# Patient Record
Sex: Female | Born: 1985 | Race: Black or African American | Hispanic: No | Marital: Married | Smoking: Never smoker
Health system: Southern US, Community
[De-identification: ages and names within clinical notes are randomized; demographics above are authoritative.]

## PROBLEM LIST (undated history)

## (undated) DIAGNOSIS — F329 Major depressive disorder, single episode, unspecified: Secondary | ICD-10-CM

## (undated) DIAGNOSIS — D219 Benign neoplasm of connective and other soft tissue, unspecified: Secondary | ICD-10-CM

## (undated) DIAGNOSIS — H8109 Meniere's disease, unspecified ear: Secondary | ICD-10-CM

## (undated) DIAGNOSIS — D649 Anemia, unspecified: Secondary | ICD-10-CM

## (undated) DIAGNOSIS — E0590 Thyrotoxicosis, unspecified without thyrotoxic crisis or storm: Secondary | ICD-10-CM

## (undated) DIAGNOSIS — E039 Hypothyroidism, unspecified: Secondary | ICD-10-CM

## (undated) DIAGNOSIS — F32A Depression, unspecified: Secondary | ICD-10-CM

## (undated) DIAGNOSIS — I4719 Other supraventricular tachycardia: Secondary | ICD-10-CM

## (undated) DIAGNOSIS — I10 Essential (primary) hypertension: Secondary | ICD-10-CM

## (undated) DIAGNOSIS — I471 Supraventricular tachycardia: Secondary | ICD-10-CM

## (undated) DIAGNOSIS — F419 Anxiety disorder, unspecified: Secondary | ICD-10-CM

## (undated) DIAGNOSIS — R42 Dizziness and giddiness: Secondary | ICD-10-CM

## (undated) HISTORY — DX: Thyrotoxicosis, unspecified without thyrotoxic crisis or storm: E05.90

## (undated) HISTORY — DX: Essential (primary) hypertension: I10

## (undated) HISTORY — DX: Benign neoplasm of connective and other soft tissue, unspecified: D21.9

## (undated) HISTORY — DX: Dizziness and giddiness: R42

## (undated) HISTORY — DX: Other supraventricular tachycardia: I47.19

## (undated) HISTORY — DX: Supraventricular tachycardia: I47.1

---

## 2010-04-12 ENCOUNTER — Emergency Department (HOSPITAL_COMMUNITY): Admission: EM | Admit: 2010-04-12 | Discharge: 2010-04-12 | Payer: Self-pay | Admitting: Emergency Medicine

## 2010-11-13 ENCOUNTER — Inpatient Hospital Stay (HOSPITAL_COMMUNITY)
Admission: EM | Admit: 2010-11-13 | Discharge: 2010-11-16 | DRG: 310 | Disposition: A | Discharge status: Home / Self Care | Payer: Self-pay | Attending: Internal Medicine | Admitting: Internal Medicine

## 2010-11-13 ENCOUNTER — Emergency Department (HOSPITAL_COMMUNITY): Payer: Self-pay

## 2010-11-13 DIAGNOSIS — E0590 Thyrotoxicosis, unspecified without thyrotoxic crisis or storm: Secondary | ICD-10-CM | POA: Diagnosis present

## 2010-11-13 DIAGNOSIS — R002 Palpitations: Secondary | ICD-10-CM | POA: Diagnosis present

## 2010-11-13 DIAGNOSIS — R000 Tachycardia, unspecified: Secondary | ICD-10-CM

## 2010-11-13 DIAGNOSIS — F411 Generalized anxiety disorder: Secondary | ICD-10-CM | POA: Diagnosis present

## 2010-11-13 DIAGNOSIS — I498 Other specified cardiac arrhythmias: Principal | ICD-10-CM | POA: Diagnosis present

## 2010-11-13 DIAGNOSIS — R0789 Other chest pain: Secondary | ICD-10-CM | POA: Diagnosis present

## 2010-11-13 LAB — DIFFERENTIAL
Basophils Absolute: 0 10*3/uL (ref 0.0–0.1)
Eosinophils Absolute: 0.1 10*3/uL (ref 0.0–0.7)
Eosinophils Relative: 1 % (ref 0–5)
Lymphocytes Relative: 38 % (ref 12–46)
Neutrophils Relative %: 49 % (ref 43–77)

## 2010-11-13 LAB — COMPREHENSIVE METABOLIC PANEL
ALT: 28 U/L (ref 0–35)
AST: 27 U/L (ref 0–37)
Albumin: 4.1 g/dL (ref 3.5–5.2)
Alkaline Phosphatase: 106 U/L (ref 39–117)
BUN: 10 mg/dL (ref 6–23)
CO2: 25 mEq/L (ref 19–32)
Creatinine, Ser: 0.38 mg/dL — ABNORMAL LOW (ref 0.4–1.2)
GFR calc Af Amer: >60 mL/min (ref >60)
GFR calc non Af Amer: >60 mL/min (ref >60)
Glucose, Bld: 101 mg/dL — ABNORMAL HIGH (ref 70–99)
Sodium: 136 mEq/L (ref 135–145)
Total Protein: 7.9 g/dL (ref 6.0–8.3)

## 2010-11-13 LAB — URINALYSIS, ROUTINE W REFLEX MICROSCOPIC
Hgb urine dipstick: NEGATIVE
Protein, ur: NEGATIVE mg/dL
Specific Gravity, Urine: 1.016 (ref 1.005–1.030)
pH: 5.5 (ref 5.0–8.0)

## 2010-11-13 LAB — T4, FREE: Free T4: 5.57 ng/dL — ABNORMAL HIGH (ref 0.80–1.80)

## 2010-11-13 LAB — CBC
Hemoglobin: 13.3 g/dL (ref 12.0–15.0)
MCH: 24.7 pg — ABNORMAL LOW (ref 26.0–34.0)
MCHC: 33.3 g/dL (ref 30.0–36.0)
RDW: 13.2 % (ref 11.5–15.5)

## 2010-11-13 LAB — TSH: TSH: 0.008 u[IU]/mL — ABNORMAL LOW (ref 0.350–4.500)

## 2010-11-13 LAB — MRSA PCR SCREENING: MRSA by PCR: POSITIVE — AB

## 2010-11-13 LAB — PROTIME-INR
INR: 0.99 (ref 0.00–1.49)
Prothrombin Time: 13.3 seconds (ref 11.6–15.2)

## 2010-11-13 LAB — T3 UPTAKE: T3 Uptake Ratio: 51.8 % — ABNORMAL HIGH (ref 22.5–37.0)

## 2010-11-13 LAB — PREGNANCY, URINE: Preg Test, Ur: NEGATIVE

## 2010-11-14 ENCOUNTER — Inpatient Hospital Stay (HOSPITAL_COMMUNITY): Payer: Self-pay

## 2010-11-14 DIAGNOSIS — I471 Supraventricular tachycardia: Secondary | ICD-10-CM

## 2010-11-16 DIAGNOSIS — I471 Supraventricular tachycardia, unspecified: Secondary | ICD-10-CM

## 2010-11-18 ENCOUNTER — Ambulatory Visit (INDEPENDENT_AMBULATORY_CARE_PROVIDER_SITE_OTHER): Payer: Self-pay | Admitting: Cardiology

## 2010-11-18 DIAGNOSIS — R000 Tachycardia, unspecified: Secondary | ICD-10-CM

## 2010-12-06 ENCOUNTER — Telehealth: Payer: Self-pay | Admitting: Cardiology

## 2010-12-06 NOTE — Telephone Encounter (Signed)
Called wanting to know if could take pain med w/ current medication. LM for pt to return call

## 2010-12-06 NOTE — Telephone Encounter (Signed)
PT WANTS TO KNOW IF SHE CAN TAKE PAIN MED WITH HER CURRENT MEDS? SEARCHING FOR CHART.

## 2011-01-03 ENCOUNTER — Encounter: Payer: Self-pay | Admitting: Cardiology

## 2011-01-03 DIAGNOSIS — E0500 Thyrotoxicosis with diffuse goiter without thyrotoxic crisis or storm: Secondary | ICD-10-CM | POA: Insufficient documentation

## 2011-01-03 DIAGNOSIS — R42 Dizziness and giddiness: Secondary | ICD-10-CM | POA: Insufficient documentation

## 2011-01-03 DIAGNOSIS — I471 Supraventricular tachycardia: Secondary | ICD-10-CM | POA: Insufficient documentation

## 2011-01-09 ENCOUNTER — Ambulatory Visit: Payer: Self-pay | Admitting: Cardiology

## 2011-01-17 ENCOUNTER — Ambulatory Visit: Payer: Self-pay | Admitting: Cardiology

## 2011-01-29 ENCOUNTER — Ambulatory Visit: Payer: Self-pay | Admitting: Cardiology

## 2011-02-18 ENCOUNTER — Encounter: Payer: Self-pay | Admitting: Cardiology

## 2011-02-18 ENCOUNTER — Ambulatory Visit (INDEPENDENT_AMBULATORY_CARE_PROVIDER_SITE_OTHER): Payer: Self-pay | Admitting: Cardiology

## 2011-02-18 VITALS — BP 134/80 | HR 80 | Ht 63.0 in | Wt 162.2 lb

## 2011-02-18 DIAGNOSIS — E0590 Thyrotoxicosis, unspecified without thyrotoxic crisis or storm: Secondary | ICD-10-CM

## 2011-02-18 DIAGNOSIS — R002 Palpitations: Secondary | ICD-10-CM

## 2011-02-18 LAB — CBC WITH DIFFERENTIAL/PLATELET
HCT: 37.4 % (ref 36.0–46.0)
Hemoglobin: 12.6 g/dL (ref 12.0–15.0)
Lymphs Abs: 2.8 10*3/uL (ref 0.7–4.0)
MCV: 77.4 fl — ABNORMAL LOW (ref 78.0–100.0)
Monocytes Relative: 12.6 % — ABNORMAL HIGH (ref 3.0–12.0)
Neutro Abs: 2.8 10*3/uL (ref 1.4–7.7)
Platelets: 341 10*3/uL (ref 150.0–400.0)
RDW: 14.5 % (ref 11.5–14.6)
WBC: 6.5 10*3/uL (ref 4.5–10.5)

## 2011-02-18 LAB — BASIC METABOLIC PANEL
BUN: 12 mg/dL (ref 6–23)
Chloride: 108 mEq/L (ref 96–112)
GFR: 285.87 mL/min (ref >60.00)
Sodium: 138 mEq/L (ref 135–145)

## 2011-02-18 LAB — HEPATIC FUNCTION PANEL
ALT: 38 U/L — ABNORMAL HIGH (ref 0–35)
Total Protein: 7.7 g/dL (ref 6.0–8.3)

## 2011-02-18 LAB — TSH: TSH: 0.02 u[IU]/mL — ABNORMAL LOW (ref 0.35–5.50)

## 2011-02-18 NOTE — Progress Notes (Signed)
Subjective:   Alyssa Hall is seen today for followup visit. She did see Dr.Balan for a first visit were entered in followup for her hyperthyroidism and ectopic atrial tachycardia. With PTU and atenolol, she basically has normalized from a standpoint of symptoms.  She is planning on getting back to endocrinology for the possibility of radioactive iodine. I again cautioned her about pregnancy with PTU. She has not had followup lab work and I will check a CBC and C. Met today as well as a TSH and refer her that to Dr. Romero Belling.  She has gradually gained weight and has gained approximate 7 pounds since March.  Current Outpatient Prescriptions  Medication Sig Dispense Refill  . atenolol (TENORMIN) 100 MG tablet Take 100 mg by mouth 2 (two) times daily.        Marland Kitchen propylthiouracil (PTU) 50 MG tablet Take 150 mg by mouth 3 (three) times daily.         No Known Allergies  Patient Active Problem List  Diagnoses  . Thyrotoxicosis  . Atrial tachycardia  . Dizziness    History  Smoking status  . Never Smoker   Smokeless tobacco  . Not on file    History  Alcohol Use No    No family history on file.  Review of Systems:   The patient denies any heat or cold intolerance.    The patient denies headaches or blurry vision.  There is no cough or sputum production.  The patient denies dizziness.  There is no hematuria or hematochezia.  The patient denies any muscle aches or arthritis.  The patient denies any rash.  The patient denies frequent falling or instability.  There is no history of depression or anxiety.  All other systems were reviewed and are negative.   Physical Exam:   Weight is 162. Blood pressure is 134/80. Heart rate is 80.The head is normocephalic and atraumatic.  Pupils are equally round and reactive to light.  Sclerae nonicteric.  Conjunctiva is clear.  Oropharynx is unremarkable.  There's adequate oral airway.  Neck is supple there are no masses.  Thyroid is not enlarged.  There is no  lymphadenopathy.  Lungs are clear.  Chest is symmetric.  Heart shows a regular rate and rhythm.  S1 and S2 are normal.  There is no murmur click or gallop.  Abdomen is soft normal bowel sounds.  There is no organomegaly.  Genital and rectal deferred.  Extremities are without edema.  Peripheral pulses are adequate.  Neurologically intact.  Full range of motion.  The patient is not depressed.  Skin is warm and dry.  Assessment / Plan:

## 2011-02-18 NOTE — Assessment & Plan Note (Signed)
She clinically appears to be euthyroid at this point in time. I encouraged her to have necessary endocrinologic followup. I will check CBC, C. Met, and TSH and forward that to Dr. Romero Belling. She has not had recurrent tachycardia and overall feels good her cardiology standpoint. I informed her that she will need to be on thyroid replacement after radioactive Iodine.

## 2011-02-24 ENCOUNTER — Telehealth: Payer: Self-pay | Admitting: *Deleted

## 2011-02-24 NOTE — Telephone Encounter (Signed)
Message copied by Adolphus Birchwood on Mon Feb 24, 2011 10:27 AM ------      Message from: Roger Shelter      Created: Fri Feb 21, 2011  1:31 PM       [facility], send to Dr. Talmage Nap.

## 2011-02-25 NOTE — Telephone Encounter (Signed)
Left message to call back.  RN will mail copies of labs to pt.  RN faxed copies of labs and office visit to Dr. Assunta Found.

## 2011-03-27 ENCOUNTER — Emergency Department (HOSPITAL_COMMUNITY)
Admission: EM | Admit: 2011-03-27 | Discharge: 2011-03-28 | Disposition: A | Discharge status: Home / Self Care | Payer: Self-pay | Attending: Emergency Medicine | Admitting: Emergency Medicine

## 2011-03-27 DIAGNOSIS — M549 Dorsalgia, unspecified: Secondary | ICD-10-CM | POA: Insufficient documentation

## 2011-03-27 DIAGNOSIS — E0590 Thyrotoxicosis, unspecified without thyrotoxic crisis or storm: Secondary | ICD-10-CM | POA: Insufficient documentation

## 2011-03-27 DIAGNOSIS — R109 Unspecified abdominal pain: Secondary | ICD-10-CM | POA: Insufficient documentation

## 2011-03-27 DIAGNOSIS — R079 Chest pain, unspecified: Secondary | ICD-10-CM | POA: Insufficient documentation

## 2011-03-27 DIAGNOSIS — Z79899 Other long term (current) drug therapy: Secondary | ICD-10-CM | POA: Insufficient documentation

## 2011-03-27 DIAGNOSIS — R0602 Shortness of breath: Secondary | ICD-10-CM | POA: Insufficient documentation

## 2011-03-28 ENCOUNTER — Emergency Department (HOSPITAL_COMMUNITY): Payer: Self-pay

## 2011-03-28 LAB — URINALYSIS, ROUTINE W REFLEX MICROSCOPIC
Bilirubin Urine: NEGATIVE
Nitrite: NEGATIVE
Protein, ur: NEGATIVE mg/dL
Urobilinogen, UA: 0.2 mg/dL (ref 0.0–1.0)

## 2011-03-28 LAB — POCT I-STAT, CHEM 8
Creatinine, Ser: 0.3 mg/dL — ABNORMAL LOW (ref 0.50–1.10)
Hemoglobin: 12.9 g/dL (ref 12.0–15.0)
Potassium: 3.8 mEq/L (ref 3.5–5.1)
Sodium: 140 mEq/L (ref 135–145)
TCO2: 22 mmol/L (ref 0–100)

## 2011-03-28 LAB — CK TOTAL AND CKMB (NOT AT ARMC)
CK, MB: 1.9 ng/mL (ref 0.3–4.0)
Relative Index: INVALID (ref 0.0–2.5)
Total CK: 84 U/L (ref 7–177)

## 2011-03-28 LAB — POCT PREGNANCY, URINE: Preg Test, Ur: NEGATIVE

## 2011-03-31 ENCOUNTER — Emergency Department (HOSPITAL_COMMUNITY)
Admission: EM | Admit: 2011-03-31 | Discharge: 2011-04-01 | Disposition: A | Discharge status: Home / Self Care | Payer: Self-pay | Attending: Emergency Medicine | Admitting: Emergency Medicine

## 2011-03-31 DIAGNOSIS — I10 Essential (primary) hypertension: Secondary | ICD-10-CM | POA: Insufficient documentation

## 2011-03-31 DIAGNOSIS — F411 Generalized anxiety disorder: Secondary | ICD-10-CM | POA: Insufficient documentation

## 2011-05-31 ENCOUNTER — Emergency Department (HOSPITAL_COMMUNITY)
Admission: EM | Admit: 2011-05-31 | Discharge: 2011-05-31 | Disposition: A | Discharge status: Home / Self Care | Payer: Self-pay | Attending: Emergency Medicine | Admitting: Emergency Medicine

## 2011-05-31 DIAGNOSIS — E0590 Thyrotoxicosis, unspecified without thyrotoxic crisis or storm: Secondary | ICD-10-CM | POA: Insufficient documentation

## 2011-05-31 DIAGNOSIS — Z9119 Patient's noncompliance with other medical treatment and regimen: Secondary | ICD-10-CM | POA: Insufficient documentation

## 2011-05-31 DIAGNOSIS — Z91199 Patient's noncompliance with other medical treatment and regimen due to unspecified reason: Secondary | ICD-10-CM | POA: Insufficient documentation

## 2011-05-31 DIAGNOSIS — R000 Tachycardia, unspecified: Secondary | ICD-10-CM | POA: Insufficient documentation

## 2011-05-31 DIAGNOSIS — R42 Dizziness and giddiness: Secondary | ICD-10-CM | POA: Insufficient documentation

## 2011-05-31 DIAGNOSIS — F411 Generalized anxiety disorder: Secondary | ICD-10-CM | POA: Insufficient documentation

## 2011-05-31 DIAGNOSIS — I10 Essential (primary) hypertension: Secondary | ICD-10-CM | POA: Insufficient documentation

## 2011-06-05 ENCOUNTER — Encounter: Payer: Self-pay | Admitting: Internal Medicine

## 2011-06-05 ENCOUNTER — Ambulatory Visit (INDEPENDENT_AMBULATORY_CARE_PROVIDER_SITE_OTHER): Payer: Self-pay | Admitting: Internal Medicine

## 2011-06-05 VITALS — BP 125/76 | HR 92 | Temp 97.5°F | Ht 63.0 in | Wt 162.3 lb

## 2011-06-05 DIAGNOSIS — I498 Other specified cardiac arrhythmias: Secondary | ICD-10-CM

## 2011-06-05 DIAGNOSIS — I471 Supraventricular tachycardia: Secondary | ICD-10-CM

## 2011-06-05 DIAGNOSIS — I4719 Other supraventricular tachycardia: Secondary | ICD-10-CM

## 2011-06-05 DIAGNOSIS — E0590 Thyrotoxicosis, unspecified without thyrotoxic crisis or storm: Secondary | ICD-10-CM

## 2011-06-05 NOTE — Patient Instructions (Addendum)
Please make an appointment 3-4 weeks after getting radioactive iodine ablation by endocrinologist. Before that if anything comes up please call the clinic and make an early appointment if needed. Please call the clinic about a week before for refills. Get the paperwork for orange card done as soon as possible so we can do the referral to endocrinology. Meanwhile keep on taking propylthiouracil 150 mg 3 times a day and atenolol 100 mg twice a day.

## 2011-06-05 NOTE — Assessment & Plan Note (Signed)
Mr. [name] was diagnosed with thyrotoxicosis in March 2012 when she was hospitalized with atrial tachycardia. Her TSH was 0.02 in June 2012 and her free T4-5.57 in March 2012. She was started on PTU 100 mg 3 times a day and atenolol 100 mg twice a day for thyrotoxicosis medical management- which she apparently tolerated well. She was supposed to be followed up by the endocrinologist meanwhile to get radioactive iodine ablation- which did not happen due to insurance issues. She was seen in the ER recently for tachycardia and palpitation again as she was out of her medications for about 3 months and did not take it because of financial issues. She got prescription of propylthiouracil and atenolol from ER which she refilled from [facility]. Her vitals are stable today and she feels okay. She is going to apply for orange card in our clinic today and once she gets that we will refer her to endocrinology for further management. Until then she would be medically managed with PTU 150 mg 3 times a day and atenolol 100 mg twice a day. I discussed with patient and her fianc about expectation of appointment with endocrinology in November and answered all their questions and concerns about thyrotoxicosis management. They verbalized understanding it and and were satisfied.

## 2011-06-05 NOTE — Assessment & Plan Note (Signed)
Heart normal today. patient taking atenolol

## 2011-06-05 NOTE — Progress Notes (Signed)
  Subjective:    [name], female    DOB: February 22, 1986, 25 y.o.   MRN: [id number]  HPI Alyssa Hall is a pleasant 57 year woman with past with history of thyrotoxicosis diagnosed in March 2012, atrial tachycardia and anxiety, comes to the clinic for establishment visit. Patient went to ER on 05/31/2011 as she was out for PTU and atenolol for medical management of thyrotoxicosis and was having tachycardia. She was admitted in hospital in March 2012 for atrial tachycardia and was found to have thyrotoxicosis For which she was supposed to have outpatient appointment with endocrinology for radioactive iodine ablation, but she did not get it done because of insurance issues. She was doing well with medications until recently when she went to ER. She was prescribed PTU and atenolol from ER and she filled it and her vitals are stable today due to that and she is feeling better. She is accompanied by her fianc. She denies any fever, chills, nausea, vomiting, palpitations, chest pain, short of breath, abdominal pain, diarrhea.   Review of Systems    as per history of present illness, all other systems reviewed and negative. Objective:   Physical Exam Constitutional: Vital signs reviewed.  Patient is a well-developed and well-nourished  in no acute distress and cooperative with exam. Alert and oriented x3.  Head: Normocephalic and atraumatic Mouth: no erythema or exudates, MMM Eyes: PERRL, EOMI, conjunctivae normal, No scleral icterus.  Neck: Supple, Trachea midline normal ROM, No JVD Cardiovascular: RRR, S1 normal, S2 normal, no MRG, pulses symmetric and intact bilaterally Pulmonary/Chest: CTAB, no wheezes, rales, or rhonchi Abdominal: Soft. Non-tender, non-distended, bowel sounds are normal Musculoskeletal: No joint deformities, erythema, or stiffness, ROM full and no nontender Neurological: A&O x3, Strenght is normal and symmetric bilaterally, cranial nerve II-XII are grossly intact,  no focal motor deficit, sensory intact to light touch bilaterally.  Skin: Warm, dry and intact. No rash, cyanosis, or clubbing.          Assessment & Plan:

## 2011-06-15 ENCOUNTER — Emergency Department (HOSPITAL_COMMUNITY)
Admission: EM | Admit: 2011-06-15 | Discharge: 2011-06-15 | Disposition: A | Discharge status: Home / Self Care | Payer: Self-pay | Attending: Emergency Medicine | Admitting: Emergency Medicine

## 2011-06-15 DIAGNOSIS — R000 Tachycardia, unspecified: Secondary | ICD-10-CM | POA: Insufficient documentation

## 2011-06-15 DIAGNOSIS — I10 Essential (primary) hypertension: Secondary | ICD-10-CM | POA: Insufficient documentation

## 2011-06-15 DIAGNOSIS — R0602 Shortness of breath: Secondary | ICD-10-CM | POA: Insufficient documentation

## 2011-06-15 DIAGNOSIS — Z79899 Other long term (current) drug therapy: Secondary | ICD-10-CM | POA: Insufficient documentation

## 2011-06-15 DIAGNOSIS — F411 Generalized anxiety disorder: Secondary | ICD-10-CM | POA: Insufficient documentation

## 2011-06-15 DIAGNOSIS — R002 Palpitations: Secondary | ICD-10-CM | POA: Insufficient documentation

## 2011-06-15 DIAGNOSIS — E0590 Thyrotoxicosis, unspecified without thyrotoxic crisis or storm: Secondary | ICD-10-CM | POA: Insufficient documentation

## 2011-06-15 DIAGNOSIS — F410 Panic disorder [episodic paroxysmal anxiety] without agoraphobia: Secondary | ICD-10-CM | POA: Insufficient documentation

## 2011-06-15 LAB — POCT I-STAT, CHEM 8
BUN: 11 mg/dL (ref 6–23)
Calcium, Ion: 1.21 mmol/L (ref 1.12–1.32)
Chloride: 107 mEq/L (ref 96–112)
Creatinine, Ser: 0.4 mg/dL — ABNORMAL LOW (ref 0.50–1.10)
Glucose, Bld: 100 mg/dL — ABNORMAL HIGH (ref 70–99)
HCT: 37 % (ref 36.0–46.0)
Hemoglobin: 12.6 g/dL (ref 12.0–15.0)
Potassium: 3.8 mEq/L (ref 3.5–5.1)
Sodium: 141 mEq/L (ref 135–145)
TCO2: 22 mmol/L (ref 0–100)

## 2011-06-15 LAB — POCT I-STAT TROPONIN I
Troponin i, poc: 0 ng/mL (ref 0.00–0.08)
Troponin i, poc: 0 ng/mL (ref 0.00–0.08)

## 2011-06-18 ENCOUNTER — Encounter: Payer: Self-pay | Admitting: Internal Medicine

## 2011-06-25 ENCOUNTER — Encounter: Payer: Self-pay | Admitting: Internal Medicine

## 2011-06-25 ENCOUNTER — Ambulatory Visit (INDEPENDENT_AMBULATORY_CARE_PROVIDER_SITE_OTHER): Payer: Self-pay | Admitting: Internal Medicine

## 2011-06-25 VITALS — BP 132/84 | HR 86 | Temp 98.1°F | Ht 63.0 in | Wt 160.9 lb

## 2011-06-25 DIAGNOSIS — F410 Panic disorder [episodic paroxysmal anxiety] without agoraphobia: Secondary | ICD-10-CM

## 2011-06-25 DIAGNOSIS — E0590 Thyrotoxicosis, unspecified without thyrotoxic crisis or storm: Secondary | ICD-10-CM

## 2011-06-25 MED ORDER — ALPRAZOLAM 1 MG PO TABS: 1.0000 mg | ORAL_TABLET | Freq: Every evening | ORAL | Status: AC | PRN

## 2011-06-25 MED ORDER — METHIMAZOLE 10 MG PO TABS: 10.0000 mg | ORAL_TABLET | Freq: Three times a day (TID) | ORAL | Status: DC

## 2011-06-25 MED ORDER — ATENOLOL 100 MG PO TABS: 100.0000 mg | ORAL_TABLET | Freq: Two times a day (BID) | ORAL | Status: DC

## 2011-06-25 NOTE — Patient Instructions (Signed)
Please make appointment after 3-4 weeks of getting radiation therapy. Please start taking methimazole 10 mg daily instead of PTU. If you have any symptoms of anxiety or other symptoms of hyperthyroidism, give [place] a call so we can adjust the dose of the new medication. Also take Xanax 1 mg as needed for anxiety attacks. You can take over-the-counter Benadryl for cough and cold.

## 2011-06-26 ENCOUNTER — Telehealth: Payer: Self-pay | Admitting: *Deleted

## 2011-06-26 DIAGNOSIS — F410 Panic disorder [episodic paroxysmal anxiety] without agoraphobia: Secondary | ICD-10-CM | POA: Insufficient documentation

## 2011-06-26 NOTE — Progress Notes (Signed)
  Subjective:    [name], female    DOB: [birth year], 25 y.o.   MRN: [id number]  HPI Alyssa Hall is a pleasant 25 year old woman with thyrotoxicosis, intermittent panic attacks who comes in the clinic for change in medication. She is on PTU for thyrotoxicosis which apparently is not available at [facility]. But they do carry methimazole and was told to see the doctor for possible change in medication. I saw her on 06/05/2011 and refilled PTU and atenolol.  After that she got orange card- but [facility] pharmacy does not carry PTU at present. Meanwhile she had one panic attack and was evaluated in the ED and given diazepam which helped her. She does complain of intermittent anxiety/panic attacks although being on PTU. She is waiting for endocrinology appointment for radio active iodine ablation- which apparently has about 2 months of waiting. She denies any fever, chills, nausea, vomiting, abdominal pain, chest pain, short of breath, diarrhea.   Review of Systems    as per history of present illness, all other systems reviewed and negative. Objective:   Physical Exam  Vitals: Reviewed General: NAD HEENT: PERRL, EOMI, no scleral icterus Cardiac: RRR, no rubs, murmurs or gallops Pulm: clear to auscultation bilaterally, moving normal volumes of air Abd: soft, nontender, nondistended, BS present Ext: warm and well perfused, no pedal edema Neuro: alert and oriented X3, cranial nerves II-XII grossly intact, strength and sensation to light touch equal in bilateral upper and lower extremities       Assessment & Plan:

## 2011-06-26 NOTE — Assessment & Plan Note (Signed)
To prevent recurrent ED visits, I will prescribe her Ativan 1 mg by mouth when necessary for panic attacks of severe anxiety symptoms. She benefited from diazepam in ER on 06/15/2011. And we prescribed 30 tablets without any refills.

## 2011-06-26 NOTE — Telephone Encounter (Signed)
Please advise of directions for methamazole, is it 10mg  daily or 10mg  3 times daily, [facility] calls and ask. Please advise, review med list and change if needed  Thank you,h.

## 2011-06-26 NOTE — Assessment & Plan Note (Signed)
Patient with severe hyperthyroidism-diagnosed in March 2012-being on thyroid suppressive medication with intermittent panic/anxiety attacks. Unable to get PTU as described in history of present illness. I will change her to methimazole 10 mg daily. Described her to call the clinic if she is symptomatic in terms of palpitations, diarrhea, heat cold intolerance or any other symptoms. She also has intermittent panic attacks and so I will prescribe Ativan 1 mg when necessary-  30 tablets without any refills.

## 2011-06-27 ENCOUNTER — Other Ambulatory Visit: Payer: Self-pay | Admitting: Internal Medicine

## 2011-06-27 MED ORDER — METHIMAZOLE 10 MG PO TABS: 10.0000 mg | ORAL_TABLET | Freq: Every day | ORAL | Status: DC

## 2011-06-27 NOTE — Telephone Encounter (Signed)
Called to pharm 

## 2011-06-27 NOTE — Telephone Encounter (Signed)
Its 10 mg daily. Thanks for pointing out. Has made appropriate change.  Merric Yost.

## 2011-07-14 ENCOUNTER — Other Ambulatory Visit (HOSPITAL_COMMUNITY): Payer: Self-pay | Admitting: Endocrinology

## 2011-07-14 DIAGNOSIS — E0590 Thyrotoxicosis, unspecified without thyrotoxic crisis or storm: Secondary | ICD-10-CM

## 2011-07-23 ENCOUNTER — Encounter (HOSPITAL_COMMUNITY)
Admission: RE | Admit: 2011-07-23 | Discharge: 2011-07-23 | Disposition: A | Discharge status: Home / Self Care | Payer: Self-pay | Source: Ambulatory Visit | Attending: Endocrinology | Admitting: Endocrinology

## 2011-07-23 DIAGNOSIS — E0590 Thyrotoxicosis, unspecified without thyrotoxic crisis or storm: Secondary | ICD-10-CM

## 2011-07-24 ENCOUNTER — Ambulatory Visit (HOSPITAL_COMMUNITY)
Admission: RE | Admit: 2011-07-24 | Discharge: 2011-07-24 | Disposition: A | Discharge status: Home / Self Care | Payer: Self-pay | Source: Ambulatory Visit | Attending: Endocrinology | Admitting: Endocrinology

## 2011-07-24 DIAGNOSIS — E0500 Thyrotoxicosis with diffuse goiter without thyrotoxic crisis or storm: Secondary | ICD-10-CM | POA: Insufficient documentation

## 2011-07-24 MED ORDER — SODIUM PERTECHNETATE TC 99M INJECTION
10.0000 | Freq: Once | INTRAVENOUS | Status: AC | PRN
  Administered 2011-07-24: 10 via INTRAVENOUS

## 2011-07-24 MED ORDER — SODIUM IODIDE I 131 CAPSULE
7.0000 | Freq: Once | INTRAVENOUS | Status: AC | PRN
  Administered 2011-07-23: 7 via ORAL

## 2011-07-25 ENCOUNTER — Encounter (HOSPITAL_COMMUNITY)
Admission: RE | Admit: 2011-07-25 | Discharge: 2011-07-25 | Disposition: A | Discharge status: Home / Self Care | Payer: Self-pay | Source: Ambulatory Visit | Attending: Endocrinology | Admitting: Endocrinology

## 2011-07-25 ENCOUNTER — Other Ambulatory Visit (HOSPITAL_COMMUNITY): Payer: Self-pay | Admitting: Endocrinology

## 2011-07-25 DIAGNOSIS — E0590 Thyrotoxicosis, unspecified without thyrotoxic crisis or storm: Secondary | ICD-10-CM

## 2011-07-25 LAB — HCG, SERUM, QUALITATIVE: Preg, Serum: NEGATIVE

## 2011-07-25 MED ORDER — SODIUM IODIDE I 131 CAPSULE
12.4000 | Freq: Once | INTRAVENOUS | Status: AC | PRN
  Administered 2011-07-25: 12.4 via ORAL

## 2011-08-02 ENCOUNTER — Other Ambulatory Visit: Payer: Self-pay | Admitting: Cardiology

## 2011-08-03 ENCOUNTER — Encounter (HOSPITAL_COMMUNITY): Payer: Self-pay

## 2011-08-03 ENCOUNTER — Emergency Department (HOSPITAL_COMMUNITY)
Admission: EM | Admit: 2011-08-03 | Discharge: 2011-08-03 | Payer: Self-pay | Attending: Emergency Medicine | Admitting: Emergency Medicine

## 2011-08-03 ENCOUNTER — Other Ambulatory Visit: Payer: Self-pay

## 2011-08-03 DIAGNOSIS — R079 Chest pain, unspecified: Secondary | ICD-10-CM | POA: Insufficient documentation

## 2011-08-03 DIAGNOSIS — R000 Tachycardia, unspecified: Secondary | ICD-10-CM | POA: Insufficient documentation

## 2011-08-03 LAB — BASIC METABOLIC PANEL
BUN: 11 mg/dL (ref 6–23)
CO2: 25 mEq/L (ref 19–32)
Calcium: 9.7 mg/dL (ref 8.4–10.5)
Chloride: 103 mEq/L (ref 96–112)
Creatinine, Ser: 0.43 mg/dL — ABNORMAL LOW (ref 0.50–1.10)
GFR calc Af Amer: >90 mL/min (ref >90)
GFR calc non Af Amer: >90 mL/min (ref >90)
Glucose, Bld: 94 mg/dL (ref 70–99)
Potassium: 3.6 mEq/L (ref 3.5–5.1)
Sodium: 137 mEq/L (ref 135–145)

## 2011-08-03 LAB — CBC
HCT: 37.7 % (ref 36.0–46.0)
Hemoglobin: 12.8 g/dL (ref 12.0–15.0)
MCH: 25.2 pg — ABNORMAL LOW (ref 26.0–34.0)
MCHC: 34 g/dL (ref 30.0–36.0)
MCV: 74.2 fL — ABNORMAL LOW (ref 78.0–100.0)
Platelets: 301 10*3/uL (ref 150–400)
RBC: 5.08 MIL/uL (ref 3.87–5.11)
RDW: 13.4 % (ref 11.5–15.5)
WBC: 5.9 10*3/uL (ref 4.0–10.5)

## 2011-08-03 LAB — PREGNANCY, URINE: Preg Test, Ur: NEGATIVE

## 2011-08-03 LAB — TROPONIN I: Troponin I: <0.3 ng/mL (ref <0.30)

## 2011-08-03 MED ORDER — PROPYLTHIOURACIL 50 MG PO TABS
100.0000 mg | ORAL_TABLET | Freq: Once | ORAL | Status: DC
  Filled 2011-08-03: qty 2

## 2011-08-03 MED ORDER — LABETALOL HCL 5 MG/ML IV SOLN
20.0000 mg | Freq: Once | INTRAVENOUS | Status: AC
  Administered 2011-08-03: 20 mg via INTRAVENOUS
  Filled 2011-08-03 (×2): qty 4

## 2011-08-03 MED ORDER — LABETALOL HCL 5 MG/ML IV SOLN
20.0000 mg | Freq: Once | INTRAVENOUS | Status: AC
  Administered 2011-08-03: 20 mg via INTRAVENOUS
  Filled 2011-08-03: qty 4

## 2011-08-03 MED ORDER — SODIUM CHLORIDE 0.9 % IV BOLUS (SEPSIS)
1000.0000 mL | Freq: Once | INTRAVENOUS | Status: AC
  Administered 2011-08-03: 1000 mL via INTRAVENOUS

## 2011-08-03 MED ORDER — METHIMAZOLE 10 MG PO TABS
20.0000 mg | ORAL_TABLET | Freq: Once | ORAL | Status: AC
  Administered 2011-08-03: 20 mg via ORAL
  Filled 2011-08-03: qty 2

## 2011-08-03 NOTE — ED Provider Notes (Addendum)
History    25yf with CP and palpitations. Onset around Friday. Persistent since then. Pt with hx of hyperthyroidism. Takes atenolol and per review of notes is supposed to be on methimazole because having issues obtaining ptu from pharmacy where gets meds. Pt says only takes atenolol though. Twice per day. Can't remember dosing. Chest aches. Substernal. Does not radiate. No SOB. Nausea. No vomiting. No fever or chills.No unusual leg pain or swelling. Denies hx of blood clot.    CSN: [id number] Arrival date & time: 08/03/2011  6:26 PM   First MD Initiated Contact with Patient 08/03/11 1856      Chief Complaint  Patient presents with  . Chest Pain    (Consider location/radiation/quality/duration/timing/severity/associated sxs/prior treatment) HPI  Past Medical History  Diagnosis Date  . Thyrotoxicosis   . Atrial tachycardia   . Dizziness     History reviewed. No pertinent past surgical history.  No family history on file.  History  Substance Use Topics  . Smoking status: Never Smoker   . Smokeless tobacco: Not on file  . Alcohol Use: No    OB History    Grav Para Term Preterm Abortions TAB SAB Ect Mult Living                  Review of Systems   Review of symptoms negative unless otherwise noted in HPI.   Allergies  Review of patient's allergies indicates no known allergies.  Home Medications   Current Outpatient Rx  Name Route Sig Dispense Refill  . ATENOLOL 100 MG PO TABS Oral Take 1 tablet (100 mg total) by mouth 2 (two) times daily. 60 tablet 5    BP 136/67  Pulse 127  Temp(Src) 98.7 F (37.1 C) (Oral)  Resp 21  SpO2 100%  Physical Exam  Nursing note and vitals reviewed. Constitutional: She appears well-developed and well-nourished. No distress.  HENT:  Head: Normocephalic and atraumatic.  Eyes: Conjunctivae are normal. Pupils are equal, round, and reactive to light. Right eye exhibits no discharge. Left eye exhibits no discharge.  Neck: Neck  supple.  Cardiovascular: Regular rhythm and normal heart sounds.  Exam reveals no gallop and no friction rub.   No murmur heard.      Markedly tachy  Pulmonary/Chest: Effort normal and breath sounds normal. No respiratory distress.  Abdominal: Soft. She exhibits no distension. There is no tenderness.  Musculoskeletal: Normal range of motion. She exhibits no edema and no tenderness.  Neurological: She is alert.  Skin: Skin is warm and dry.  Psychiatric: She has a normal mood and affect. Her behavior is normal. Thought content normal.    ED Course  Procedures (including critical care time)  Labs Reviewed  CBC - Abnormal; Notable for the following:    MCV 74.2 (*)    MCH 25.2 (*)    All other components within normal limits  PREGNANCY, URINE  TROPONIN I  BASIC METABOLIC PANEL  TSH  T4, FREE  T3, FREE   EKG:  Rhythm: sinus tach Rate: 163 Axis: left Intervals: normal ST segments: NS ST changes  Repeat EKG:  Rhythm: sinus tachycardia Rate: 109 Axis: left Intervals: mildy prolonged qtc @ 490 ms ST segments: NS ST changes    No results found.   1. Tachycardia   2. Chest pain     8:41 PM Pt reassessed. Symptoms resolved. Still sinus tach on monitor though in 110-120. Will give additional dose labetolol and reassess.  11:18 PM Discussed with cards. Do not  recommend increasing atenolol since already on max dose. Does not recommend adding additional agent since symptoms resolved and rate improved. Recommends that pt follow-up with Dr Deborah Chalk this week to discuss further management if needed.    MDM  25yF with CP and palpitations. Possibly secondary to thyroid toxicosis but upon further review of chart pt underwent radioactive iodine therapy about a week ago. Explains why pt is no longer taking ptu or methimazole. Thyroid studies to assist follow-up provider. HR currently controlled with IV labetolol. Discussed with cards and not recommending additional medical  management at this time. Pt currently symptoms free. Instructed to continue atenolol 100mg  BID and to follow-up with Dr Deborah Chalk this week. Doubt PE and with reasonable alterative etiology for symptoms. No dyspnea or hypoxia. Discussed with endocrinology. When initially consulted, unaware that pt had radioactive iodine tx. Do not feel need to rediscuss case at this time or need for pt to start take ptu or methimazole again. Pt to follow-up with Dr Talmage Nap in 1w per her recommendations.      Raeford Razor, MD 08/03/11 0454  Raeford Razor, MD 08/03/11 [phone]

## 2011-08-03 NOTE — ED Notes (Signed)
Pt in from home with chest pain since Friday states some sob pt has a hx of hyperthyroidism states pain is center chest non radiating denies n/v

## 2011-08-03 NOTE — ED Notes (Signed)
Patient is resting comfortably. 

## 2011-08-03 NOTE — ED Notes (Signed)
Family at bedside. 

## 2011-08-03 NOTE — ED Notes (Signed)
Patient denies pain and is resting comfortably.  

## 2011-08-03 NOTE — ED Notes (Signed)
Pt states radiation to the thyroid 1 week ago

## 2011-08-04 LAB — T3, FREE: T3, Free: 8.4 pg/mL — ABNORMAL HIGH (ref 2.3–4.2)

## 2011-08-04 LAB — T4, FREE: Free T4: 2.61 ng/dL — ABNORMAL HIGH (ref 0.80–1.80)

## 2011-08-04 LAB — TSH: TSH: <0.008 u[IU]/mL — ABNORMAL LOW (ref 0.350–4.500)

## 2011-08-06 ENCOUNTER — Other Ambulatory Visit: Payer: Self-pay | Admitting: *Deleted

## 2011-08-06 MED ORDER — ATENOLOL 100 MG PO TABS: 100.0000 mg | ORAL_TABLET | Freq: Two times a day (BID) | ORAL | Status: DC

## 2011-08-14 ENCOUNTER — Encounter: Payer: Self-pay | Admitting: Internal Medicine

## 2011-08-20 ENCOUNTER — Encounter: Payer: Self-pay | Admitting: Internal Medicine

## 2011-10-01 ENCOUNTER — Encounter: Payer: Self-pay | Admitting: Internal Medicine

## 2011-10-01 ENCOUNTER — Ambulatory Visit (INDEPENDENT_AMBULATORY_CARE_PROVIDER_SITE_OTHER): Payer: Self-pay | Admitting: Internal Medicine

## 2011-10-01 VITALS — BP 114/77 | HR 89 | Temp 97.6°F | Ht 63.0 in | Wt 179.5 lb

## 2011-10-01 DIAGNOSIS — E0500 Thyrotoxicosis with diffuse goiter without thyrotoxic crisis or storm: Secondary | ICD-10-CM

## 2011-10-01 NOTE — Progress Notes (Signed)
  Subjective:    [name], female    DOB: 01-20-1986, 26 y.o.   MRN: [id number]  HPI [name] is a pleasant 26 year woman with past history of Graves' disease who comes the clinic for followup visit.  She got radio iodine ablation in November 2012 per Dr. Evlyn Kanner with endocrinology. She saw him on 09/10/2011. Her free T4 levels 1.8 and total T3 levels 104.4 during that visit. TSH was not checked. Her methimazole was stopped at that visit.  She denies any symptoms of hyperthyroidism namely tremors, heat/cold intolerance, palpitations. Also she has gained about 19 pounds weight in past 4 months.  She has had an appointment with ophthalmologist for dry eyes and was given when necessary teardrops.  She denies any fever, chills, nausea, vomiting, abdominal pain, chest pain, short of breath, diarrhea.  She wanted to know if she can do intensive physical exercise to lose weight.  Review of Systems    as per history of present illness, all other systems reviewed and negative. Objective:   Physical Exam  General: NAD HEENT: PERRL, EOMI, no scleral icterus Cardiac: S1, S2, RRR, no rubs, murmurs or gallops Pulm: clear to auscultation bilaterally, moving normal volumes of air Abd: soft, nontender, nondistended, BS present Ext: warm and well perfused, no pedal edema Neuro: alert and oriented X3, cranial nerves II-XII grossly intact       Assessment & Plan:

## 2011-10-01 NOTE — Patient Instructions (Signed)
Please make a followup appointment in 2-3 months- after you see Dr. Evlyn Kanner.  Your don't have to take atenolol every day- just take it if you have any symptoms of fast heartbeat, tremors.   You can start with walking and mild exercise. I will give you a call after I get the notes from Dr. Evlyn Kanner, if you can start intensive exercise.

## 2011-10-01 NOTE — Assessment & Plan Note (Signed)
Got radioiodine ablation in November 2012. Followup appointment with Dr. Evlyn Kanner on 09/10/2011- showed acceptable levels of free T4 and total T3. Next appointment with him in 3 months from then.  I will recommend her using atenolol when necessary for symptom control.  I advised her to start mild exercise with walking and minimal jogging for her weight loss but not to start intensive exercise therapy at present. She would probably benefit from waiting for next one to 2 months. She and her boyfriend verbalized understanding.

## 2011-11-15 ENCOUNTER — Encounter (HOSPITAL_COMMUNITY): Payer: Self-pay | Admitting: *Deleted

## 2011-11-15 ENCOUNTER — Emergency Department (HOSPITAL_COMMUNITY)
Admission: EM | Admit: 2011-11-15 | Discharge: 2011-11-15 | Disposition: A | Discharge status: Home / Self Care | Payer: Self-pay | Attending: Emergency Medicine | Admitting: Emergency Medicine

## 2011-11-15 ENCOUNTER — Emergency Department (HOSPITAL_COMMUNITY): Payer: Self-pay

## 2011-11-15 DIAGNOSIS — M542 Cervicalgia: Secondary | ICD-10-CM | POA: Insufficient documentation

## 2011-11-15 DIAGNOSIS — M25519 Pain in unspecified shoulder: Secondary | ICD-10-CM | POA: Insufficient documentation

## 2011-11-15 DIAGNOSIS — R209 Unspecified disturbances of skin sensation: Secondary | ICD-10-CM | POA: Insufficient documentation

## 2011-11-15 MED ORDER — CYCLOBENZAPRINE HCL 10 MG PO TABS
10.0000 mg | ORAL_TABLET | Freq: Once | ORAL | Status: AC
  Administered 2011-11-15: 10 mg via ORAL
  Filled 2011-11-15: qty 1

## 2011-11-15 MED ORDER — OXYCODONE-ACETAMINOPHEN 5-325 MG PO TABS: 1.0000 | ORAL_TABLET | ORAL | Status: AC | PRN

## 2011-11-15 MED ORDER — OXYCODONE-ACETAMINOPHEN 5-325 MG PO TABS
1.0000 | ORAL_TABLET | Freq: Once | ORAL | Status: AC
  Administered 2011-11-15: 1 via ORAL
  Filled 2011-11-15: qty 1

## 2011-11-15 MED ORDER — CYCLOBENZAPRINE HCL 10 MG PO TABS: 10.0000 mg | ORAL_TABLET | Freq: Two times a day (BID) | ORAL | Status: AC | PRN

## 2011-11-15 NOTE — Discharge Instructions (Signed)
[name] your cervical spine x-ray show cervical lordosis which is probably not the reason for your pain.  Follow up with a Orthopedic physician listed below if you continue to have this pain.  Use ice and a short 2 days of ibuprofen 600mg  every 6 hours x 24.  Take the percocet and muscle relaxor as needed but do not drive with this.   Return if you have severe weakness or severe pain in the extremitys or other concerns.

## 2011-11-15 NOTE — ED Notes (Signed)
Pt from home with reports of pain in left arm and right hand numbness for 1 week, pt had thyroid radiated in 07/2011 from hypothyroidism.

## 2011-11-15 NOTE — ED Provider Notes (Signed)
History     CSN: [id number]  Arrival date & time 11/15/11  1513   First MD Initiated Contact with Patient 11/15/11 1803      Chief Complaint  Patient presents with  . Arm Pain    left  . Numbness    right hand    (Consider location/radiation/quality/duration/timing/severity/associated sxs/prior treatment) Patient is a 26 y.o. female presenting with arm pain. The history is provided by the patient. No language interpreter was used.  Arm Pain This is a new problem. The current episode started in the past 7 days. The problem occurs daily. The problem has been gradually worsening. Associated symptoms include neck pain and numbness. Pertinent negatives include no chills, diaphoresis, fever, headaches, joint swelling, nausea, vomiting or weakness. She has tried acetaminophen for the symptoms.   Reports neck left shoulder and left arm pain and intermittent x7 days. Also states that she's had some numbness in her right fingers no numbness presently. States she was cooking yesterday and dropped a lid with her left hand. Pain is worse at night.  Carpel compression test negative.  Good strength presently to bilateral hands.  Pain to L shoulder with hyperextension and abduction.  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  Good radial pulses bilaterally and good sensation bilaterally. Point tenderness to the cervical spine. Point tenderness to the left shoulder. Denies injury past medical history of thyrotoxicosis atrial tachycardia and dizziness. She is on Tenormin. Past Medical History  Diagnosis Date  . Thyrotoxicosis   . Atrial tachycardia   . Dizziness     History reviewed. No pertinent past surgical history.  History reviewed. No pertinent family history.  History  Substance Use Topics  . Smoking status: Never Smoker   . Smokeless tobacco: Never Used  . Alcohol Use: No    OB History    Grav Para Term Preterm Abortions TAB SAB Ect Mult Living              Review of Systems  Constitutional: Negative for fever, chills and diaphoresis.  HENT: Positive for neck pain.   Gastrointestinal: Negative for nausea and vomiting.  Musculoskeletal: Negative for joint swelling.  Neurological: Positive for numbness. Negative for weakness and headaches.  All other systems reviewed and are negative.    Allergies  Review of patient's allergies indicates no known allergies.  Home Medications   Current Outpatient Rx  Name Route Sig Dispense Refill  . ATENOLOL 100 MG PO TABS Oral Take 100 mg by mouth 2 (two) times daily as needed. For  tremors, palpitations, anxiety.      BP 128/88  Pulse 67  Temp(Src) 99.3 F (37.4 C) (Oral)  Resp 16  Ht 5\' 5"  (1.651 m)  Wt 181 lb 6.4 oz (82.283 kg)  BMI 30.19 kg/m2  SpO2 100%  LMP 11/08/2011  Physical Exam  Nursing note and vitals reviewed. Constitutional: She is oriented to person, place, and time. She appears well-developed and well-nourished.  HENT:  Head: Normocephalic and atraumatic.  Eyes: Conjunctivae and EOM are normal. Pupils are equal, round, and reactive to light.  Neck: Normal range of motion. Neck supple. No thyromegaly present.  Cardiovascular: Normal rate.   Pulmonary/Chest: Effort normal.  Abdominal: Soft. Bowel sounds are normal.  Musculoskeletal: Normal range of motion. She exhibits tenderness. She exhibits no edema.       Carpel compression test negative.  Point Tenderness to cervical spine L shoulder and upper L arm  Lymphadenopathy:  She has no cervical adenopathy.  Neurological: She is alert and oriented to person, place, and time.  Skin: Skin is warm and dry.  Psychiatric: She has a normal mood and affect.    ED Course  Procedures (including critical care time)  Labs Reviewed - No data to display No results found.   No diagnosis found.    MDM   26yo female with LUE/shoulder and cervical spine pain.  Intermittant numbness to R hand (not presently).   Minimal reversal or th enormal cervical lordosis on x-ray.  Follow up with ortho.  Return if weakness or dropping things.  Carpel tunnel compression test negative.  Good ROM, radial pulses and sensation to both extremities. Pain relief in the ER with percocet and flexeril.  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       Jethro Bastos, NP 11/16/11 1246

## 2011-11-16 NOTE — ED Provider Notes (Signed)
Medical screening examination/treatment/procedure(s) were performed by non-physician practitioner and as supervising physician I was immediately available for consultation/collaboration.   Adrieanna Boteler A. Patrica Duel, MD 11/16/11 1326

## 2011-12-22 ENCOUNTER — Other Ambulatory Visit (INDEPENDENT_AMBULATORY_CARE_PROVIDER_SITE_OTHER): Payer: Self-pay

## 2011-12-22 DIAGNOSIS — E0590 Thyrotoxicosis, unspecified without thyrotoxic crisis or storm: Secondary | ICD-10-CM

## 2012-01-05 ENCOUNTER — Encounter: Payer: Self-pay | Admitting: Internal Medicine

## 2012-01-08 ENCOUNTER — Encounter: Payer: Self-pay | Admitting: *Deleted

## 2012-01-08 DIAGNOSIS — F4381 Prolonged grief disorder: Secondary | ICD-10-CM

## 2012-01-08 DIAGNOSIS — F4329 Adjustment disorder with other symptoms: Secondary | ICD-10-CM

## 2012-01-08 DIAGNOSIS — F411 Generalized anxiety disorder: Secondary | ICD-10-CM

## 2012-01-08 MED ORDER — FLUOXETINE HCL 20 MG PO CAPS: 20.0000 mg | ORAL_CAPSULE | Freq: Every day | ORAL | Status: DC
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
## 2012-01-08 NOTE — Progress Notes (Signed)
Pt came to clinic as walk in with c/o headache, blurry vision, watery eyes, and  Dizziness.  Onset 1 week ago when started synthroid. Last seen in January.   Pt has been seeing her endocrinologist, Dr Evlyn Kanner and he is aware pt is not feeling well but does not want to see her for 3 months to see how synthroid is doing. Pt in my office and is tearful.  She drove her self here.  She has not taken anything for headache.  Asked Dr Josem Kaufmann to see pt. Do to the late hour.

## 2012-01-09 ENCOUNTER — Telehealth: Payer: Self-pay | Admitting: *Deleted

## 2012-01-09 ENCOUNTER — Other Ambulatory Visit: Payer: Self-pay

## 2012-01-09 NOTE — Progress Notes (Signed)
Med Rx called to [facility], pt to arrive today for U/P, referral given to [facility] to complete, and pt has appointment with PCP next week.

## 2012-01-09 NOTE — Telephone Encounter (Signed)
[facility] today after work as previously arranged with Dr. Josem Kaufmann and Merrie Roof, RN. Trixie Dredge, 01/09/2012, 10:48P

## 2012-01-14 ENCOUNTER — Encounter: Payer: Self-pay | Admitting: Internal Medicine

## 2012-01-14 ENCOUNTER — Ambulatory Visit (INDEPENDENT_AMBULATORY_CARE_PROVIDER_SITE_OTHER): Payer: Self-pay | Admitting: Internal Medicine

## 2012-01-14 VITALS — BP 112/71 | HR 98 | Temp 100.9°F | Ht 63.0 in | Wt 173.4 lb

## 2012-01-14 DIAGNOSIS — Z23 Encounter for immunization: Secondary | ICD-10-CM

## 2012-01-14 DIAGNOSIS — E0500 Thyrotoxicosis with diffuse goiter without thyrotoxic crisis or storm: Secondary | ICD-10-CM

## 2012-01-14 DIAGNOSIS — F411 Generalized anxiety disorder: Secondary | ICD-10-CM | POA: Insufficient documentation

## 2012-01-14 DIAGNOSIS — E039 Hypothyroidism, unspecified: Secondary | ICD-10-CM

## 2012-01-14 DIAGNOSIS — N390 Urinary tract infection, site not specified: Secondary | ICD-10-CM

## 2012-01-14 LAB — CBC WITH DIFFERENTIAL/PLATELET
Eosinophils Absolute: 0.1 10*3/uL (ref 0.0–0.7)
Eosinophils Relative: 3 % (ref 0–5)
Hemoglobin: 9.3 g/dL — ABNORMAL LOW (ref 12.0–15.0)
Lymphs Abs: 1.3 10*3/uL (ref 0.7–4.0)
MCH: 24 pg — ABNORMAL LOW (ref 26.0–34.0)
MCHC: 30.9 g/dL (ref 30.0–36.0)
MCV: 77.6 fL — ABNORMAL LOW (ref 78.0–100.0)
Monocytes Absolute: 0.4 10*3/uL (ref 0.1–1.0)
Monocytes Relative: 8 % (ref 3–12)
RBC: 3.88 MIL/uL (ref 3.87–5.11)

## 2012-01-14 MED ORDER — LEVOTHYROXINE SODIUM 100 MCG PO TABS: 100.0000 ug | ORAL_TABLET | Freq: Every day | ORAL | Status: DC

## 2012-01-14 NOTE — Assessment & Plan Note (Signed)
Status post radioablation. Now hypothyroid. Started on Synthroid recently. Still has mild proptosis with dry eyes. Recommended to start using artificial tears - which she was using before. She will probably need an repeat eye exam later after evaluation by Dr. Evlyn Kanner- as per patient.  Although, we'll try to schedule an appointment with optometrist for prescription glasses evaluation today.

## 2012-01-14 NOTE — Assessment & Plan Note (Signed)
I will check UA and microscopy due to her increased frequency and mild temperature. Also will check CBC. Will not start on antibiotic today. We'll wait for the UA results.

## 2012-01-14 NOTE — Patient Instructions (Signed)
Please make a followup appointment in 4-6 weeks.  I will give you a call if your urine test for blood test shows any abnormal results. Keep taking Prozac and Synthroid as prescribed daily.  The social worker will give you a call and set appointment for you. Please follow up with that appointment.  Give [place] a call for early appointment if anything comes up meanwhile. If you keep having fevers, please check temperature at home and call us for early appointment.

## 2012-01-14 NOTE — Assessment & Plan Note (Signed)
Continue Synthroid 100 mg daily As per Dr. Evlyn Kanner. Last TSH 9. Next appointment with Dr. Evlyn Kanner in about 3 months. Patient feels weak after starting Synthroid- but no other significant symptoms at present.

## 2012-01-14 NOTE — Progress Notes (Signed)
  Subjective:    [name], female    DOB: 04-12-1986, 25 y.o.   MRN: [id number]  HPI patient is a pleasant 33 year woman with past history significant for Graves' disease- status post radioablation, now hypothyroid, who comes the clinic for followup visit. She was seen by Dr. Josem Kaufmann on 01/08/2012- which he walked in with symptoms which were consistent with either BPPV or GAD/delayed grief reaction. She was started on Prozac- which she is taking daily. She hasn't felt much better in terms of depression yet- but has no suicidal ideations or thoughts. She is accompanied by her fianc, who always comes in with her and seems to be very supportive.  Her dizziness, weakness has improved.  Although she still has a dry eyes and blurry vision. She needs to be checked for prescription glasses has never had one done before. She has trouble reading at work. She had an eye exam before radioablation- when she did not have any significant eye disease except dry eyes- for which she was given eye drops. She is not using eyedrops for long time and she is out of it. Although there is no redness, pain, discharge from eyes.  Patient does have temperature of 100.9 today- but denies any new focal pain, cough. Does not have any chills or felt feverish. Does not have any sweats including night sweats. She does have increased frequency of urination for past few days- but no burning micturition or suprapubic pain or pain with urination.  She denies any nausea, vomiting, abdominal pain, headache. She never had tetanus shot or Pap smear.   Review of Systems    as per history of present illness, all other systems reviewed and negative. Objective:   Physical Exam Constitutional: Vital signs reviewed.  Patient is a well-developed and well-nourished  in no acute distress and cooperative with exam. Alert and oriented x3.  Head: Normocephalic and atraumatic Mouth: no erythema or exudates, MMM Eyes: PERRL,  EOMI, conjunctivae normal, No scleral icterus.  mild proptosis Neck: Supple, Trachea midline normal ROM, No JVD, mass, thyromegaly, or carotid bruit present.  Cardiovascular: RRR, S1 normal, S2 normal, no MRG, pulses symmetric and intact bilaterally Pulmonary/Chest: CTAB, no wheezes, rales, or rhonchi Abdominal: Soft. Non-tender, non-distended, bowel sounds are normal, no masses, organomegaly, or guarding present.  GU: no CVA tenderness Musculoskeletal: No joint deformities, erythema, or stiffness, ROM full and no nontender Hematology: no cervical, inginal, or axillary adenopathy.  Neurological: A&O x3, Strenght is normal and symmetric bilaterally, cranial nerve II-XII are grossly intact, no focal motor deficit, sensory intact to light touch bilaterally.  Skin: Warm, dry and intact. No rash, cyanosis, or clubbing.           Assessment & Plan:

## 2012-01-14 NOTE — Assessment & Plan Note (Signed)
Unclear yet if she has did grade unclear yet if she has delayed grief reaction or generalized anxiety disorder. Although she still has anxiety and depression- which is not much different after starting Prozac. I encouraged her to continue Prozac until one or 2 more weeks and if she does not feel better, will reevaluate her from. She also agreed to talk with Child psychotherapist.  She does not want to talk to a psychiatrist at present. Although she does not have any urgent needs to see a psychiatrist. We would refer her to psychiatry in future if needed, as she had some thoughts of suicide during last visit, but has none now.

## 2012-01-15 LAB — URINALYSIS, ROUTINE W REFLEX MICROSCOPIC
Bilirubin Urine: NEGATIVE
Hgb urine dipstick: NEGATIVE
Ketones, ur: NEGATIVE mg/dL
Specific Gravity, Urine: 1.016 (ref 1.005–1.030)
pH: 7.5 (ref 5.0–8.0)

## 2012-01-16 ENCOUNTER — Telehealth: Payer: Self-pay | Admitting: Licensed Clinical Social Worker

## 2012-01-16 NOTE — Telephone Encounter (Signed)
[name] has been referred to CSW for psychiatry and psychotherapy referrals.  CSW attempt to call pt at phone number pt provided a contact number: [phone], left message with female acquaintance requesting return call. CSW to provide pt with information to: [employer] of [place], [place] and Support Groups as well as 24 hr crisis lines to [employer] and [place].

## 2012-01-21 ENCOUNTER — Emergency Department (HOSPITAL_COMMUNITY)
Admission: EM | Admit: 2012-01-21 | Discharge: 2012-01-22 | Disposition: A | Discharge status: Home / Self Care | Payer: Self-pay | Attending: Emergency Medicine | Admitting: Emergency Medicine

## 2012-01-21 ENCOUNTER — Encounter (HOSPITAL_COMMUNITY): Payer: Self-pay | Admitting: *Deleted

## 2012-01-21 ENCOUNTER — Emergency Department (HOSPITAL_COMMUNITY): Payer: Self-pay

## 2012-01-21 DIAGNOSIS — F341 Dysthymic disorder: Secondary | ICD-10-CM | POA: Insufficient documentation

## 2012-01-21 DIAGNOSIS — R071 Chest pain on breathing: Secondary | ICD-10-CM | POA: Insufficient documentation

## 2012-01-21 DIAGNOSIS — R0789 Other chest pain: Secondary | ICD-10-CM

## 2012-01-21 DIAGNOSIS — E039 Hypothyroidism, unspecified: Secondary | ICD-10-CM | POA: Insufficient documentation

## 2012-01-21 DIAGNOSIS — M79602 Pain in left arm: Secondary | ICD-10-CM

## 2012-01-21 DIAGNOSIS — M79609 Pain in unspecified limb: Secondary | ICD-10-CM | POA: Insufficient documentation

## 2012-01-21 HISTORY — DX: Major depressive disorder, single episode, unspecified: F32.9

## 2012-01-21 HISTORY — DX: Anxiety disorder, unspecified: F41.9

## 2012-01-21 HISTORY — DX: Hypothyroidism, unspecified: E03.9

## 2012-01-21 HISTORY — DX: Depression, unspecified: F32.A

## 2012-01-21 LAB — CBC
HCT: 30.5 % — ABNORMAL LOW (ref 36.0–46.0)
RDW: 14.9 % (ref 11.5–15.5)
WBC: 7.2 10*3/uL (ref 4.0–10.5)

## 2012-01-21 LAB — POCT I-STAT TROPONIN I: Troponin i, poc: 0 ng/mL (ref 0.00–0.08)

## 2012-01-21 LAB — BASIC METABOLIC PANEL
BUN: 11 mg/dL (ref 6–23)
Chloride: 103 mEq/L (ref 96–112)
GFR calc Af Amer: >90 mL/min (ref >90)
Potassium: 3.5 mEq/L (ref 3.5–5.1)

## 2012-01-21 NOTE — Telephone Encounter (Signed)
CSW placed called to pt, phone number unavailable.  CSW will send information in the mail to pt, as alternated number belongs to female friend.

## 2012-01-21 NOTE — Telephone Encounter (Signed)
CSW will also include information on local Support Groups
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
## 2012-01-22 ENCOUNTER — Encounter: Payer: Self-pay | Admitting: Licensed Clinical Social Worker

## 2012-01-22 MED ORDER — IBUPROFEN 800 MG PO TABS
800.0000 mg | ORAL_TABLET | Freq: Once | ORAL | Status: DC
  Filled 2012-01-22: qty 1

## 2012-01-22 MED ORDER — OXYCODONE-ACETAMINOPHEN 5-325 MG PO TABS: 1.0000 | ORAL_TABLET | ORAL | Status: AC | PRN

## 2012-01-22 MED ORDER — IBUPROFEN 800 MG PO TABS
800.0000 mg | ORAL_TABLET | Freq: Once | ORAL | Status: AC
  Administered 2012-01-22: 800 mg via ORAL

## 2012-01-22 MED ORDER — IBUPROFEN 800 MG PO TABS: 800.0000 mg | ORAL_TABLET | Freq: Three times a day (TID) | ORAL | Status: AC
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
## 2012-01-23 NOTE — ED Provider Notes (Signed)
Medical screening examination/treatment/procedure(s) were performed by non-physician practitioner and as supervising physician I was immediately available for consultation/collaboration.   Forbes Cellar, MD 01/23/12 [phone]

## 2012-02-04 ENCOUNTER — Ambulatory Visit (INDEPENDENT_AMBULATORY_CARE_PROVIDER_SITE_OTHER): Payer: Self-pay | Admitting: Internal Medicine

## 2012-02-04 ENCOUNTER — Encounter: Payer: Self-pay | Admitting: Internal Medicine

## 2012-02-04 VITALS — BP 125/76 | HR 89 | Temp 98.5°F | Wt 175.9 lb

## 2012-02-04 DIAGNOSIS — K5900 Constipation, unspecified: Secondary | ICD-10-CM

## 2012-02-04 DIAGNOSIS — E039 Hypothyroidism, unspecified: Secondary | ICD-10-CM

## 2012-02-04 DIAGNOSIS — D649 Anemia, unspecified: Secondary | ICD-10-CM

## 2012-02-04 DIAGNOSIS — D509 Iron deficiency anemia, unspecified: Secondary | ICD-10-CM | POA: Insufficient documentation

## 2012-02-04 MED ORDER — DOCUSATE SODIUM 100 MG PO CAPS: 100.0000 mg | ORAL_CAPSULE | Freq: Two times a day (BID) | ORAL | Status: AC | PRN

## 2012-02-04 NOTE — Assessment & Plan Note (Signed)
Most likely from the irregular menstrual bleeding from hypothyroidism. Hemoglobin dropped from 12.8 in November 2012 to 9.3 in may 2013. Although asymptomatic today- does have on and off chest pain and dizziness. Chest x-ray clear on the 15th may 2013 in ER- just had mild cardiomegaly. I will check anemia panel today. Patient has microcytic anemia which point towards increased menstrual loss.

## 2012-02-04 NOTE — Assessment & Plan Note (Signed)
Continue Synthroid 100 mcg daily.

## 2012-02-04 NOTE — Patient Instructions (Signed)
Please make followup appointment in 2-3 months or as needed.  Keep taking Synthroid daily. Also continue taking one iron tablet daily.  We will check the anemia panel today and if anything abnormal shows up in that, I will give you a call for further management changes.  Start taking Colace 100 mg twice a day as needed for constipation along with fibers and bran in diet.

## 2012-02-04 NOTE — Progress Notes (Signed)
  Subjective:    [name], female    DOB: 1986-04-25, 26 y.o.   MRN: [id number]  HPIpatient is a pleasant 26 year woman with past with history significant for Graves' disease status post ablation- now hypothyroid on Synthroid comes for followup visit. She does not have any new problems today. Feels okay. Has no chest pain, short of breath, abdominal pain, nausea vomiting, fever, chills. She does not have any worsening of anxiety or depression- although does not seem to be open for discussion today. Her boyfriend is accompanying her- who always is with her when she comes to the clinic. She also complains of constipation for past 3-4 months- she has about once every 4 days. Also mentions about been diagnosed with anemia when she went to ER. She has increased menstrual bleeding after radioactive ablation. Her periods lasts longer with heavy bleeding. Her hemoglobin in November 2012 was 12.8 on repeat check in may 2013 has been 9.3 and 9.8. Although patient does not have any worsening weakness or dizziness.   Review of Systems    as per history of present illness, all other systems reviewed and negative. Objective:   Physical Exam  General: NAD HEENT: PERRL, EOMI, no scleral icterus Cardiac: RRR, no rubs, murmurs or gallops Pulm: clear to auscultation bilaterally, moving normal volumes of air Abd: soft, nontender, nondistended, BS present Ext: warm and well perfused, no pedal edema Neuro: alert and oriented X3, cranial nerves II-XII grossly intact       Assessment & Plan:

## 2012-02-04 NOTE — Assessment & Plan Note (Addendum)
Most likely from hypothyroidism after Radioiodine ablation.  She has recently started taking ferrous sulfate but was already having constipation before starting that. Discussed about high-fiber diet with bran and also starting Colace 100 mg twice a day as needed for constipation. She will get over-the-counter.

## 2012-02-05 LAB — IRON AND TIBC: TIBC: 434 ug/dL (ref 250–470)

## 2012-02-05 LAB — VITAMIN B12: Vitamin B-12: 499 pg/mL (ref 211–911)

## 2012-02-05 LAB — FOLATE: Folate: 11.6 ng/mL

## 2012-02-17 ENCOUNTER — Encounter: Payer: Self-pay | Admitting: Internal Medicine

## 2012-02-17 ENCOUNTER — Ambulatory Visit (INDEPENDENT_AMBULATORY_CARE_PROVIDER_SITE_OTHER): Payer: Self-pay | Admitting: Internal Medicine

## 2012-02-17 VITALS — BP 109/71 | HR 93 | Temp 98.2°F | Wt 176.1 lb

## 2012-02-17 DIAGNOSIS — Z124 Encounter for screening for malignant neoplasm of cervix: Secondary | ICD-10-CM

## 2012-02-17 NOTE — Assessment & Plan Note (Signed)
Today's visit was due to Pap smear.
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
## 2012-02-17 NOTE — Patient Instructions (Signed)
Pap Test A Pap test is a sampling of cells from a woman's cervix. The cervix is the opening between the vagina (birth canal) and the uterus (the bottom part of the womb). The cells are scraped from the cervix during a pelvic exam. These cells are then looked at under a microscope to see if the cells are normal or to see if a cancer is developing or there are changes that suggest a cancer will develop. Cervical dysplasia is a condition in which a woman has abnormal changes in the top layer of cells of her cervix. These changes are an early sign that cervical cancer may develop. Pap tests also look for the human papilloma virus (HPV) because it has 4 types that are responsible for 70% of cervical cancer. Infections can also be found during a Pap test such as bacteria, fungus, protozoa and viruses.  Cervical cancer is harder to treat and less likely to have a good outcome if left untreated. Catching the disease at an early stage leads to a better outcome. Since the Pap test was introduced 60 years ago, deaths from cervical cancer have decreased by 70%. Every woman should keep up to date with Pap tests. RISK FACTORS FOR CERVICAL CANCER INCLUDE:   Becoming sexually active before age 18.   Being the daughter of a woman who took diethylstilbestrol (DES) during pregnancy.   Having a sexual partner who has or has had cancer of the penis.   Having a sexual partner whose past partner had cervical cancer or cervical dysplasia (early cell changes which suggest a cancer may develop).   Having a weakened immune system. An example would be HIV or other immunodeficiency disorder.   Having had a sexually transmitted infection such as chlamydia, gonorrhea or HPV.   Having had an abnormal Pap or cancer of the vagina or vulva.   Having had more than one sexual partner.   A history of cervical cancer in a woman's sister or mother.   Not using condoms with new sexual partners.   Smoking.  WHO SHOULD HAVE PAP  TESTS  A Pap test is done to screen for cervical cancer.   The first Pap test should be done at age 21.   Between ages 21 and 29, Pap tests are repeated every 2 years.   Beginning at age 30, you are advised to have a Pap test every 3 years as long as your past 3 Pap tests have been normal.   Some women have medical problems that increase the chance of getting cervical cancer. Talk to your caregiver about these problems. It is especially important to talk to your caregiver if a new problem develops soon after your last Pap test. In these cases, your caregiver may recommend more frequent screening and Pap tests.   The above recommendations are the same for women who have or have not gotten the vaccine for HPV (Human Papillomavirus).   If you had a hysterectomy for a problem that was not a cancer or a condition that could lead to cancer, then you no longer need Pap tests. However, even if you no longer need a Pap test, a regular exam is a good idea to make sure no other problems are starting.    If you are between ages 65 and 70, and you have had normal Pap tests going back 10 years, you no longer need Pap tests. However, even if you no longer need a Pap test, a regular exam is a good idea   to make sure no other problems are starting.    If you have had past treatment for cervical cancer or a condition that could lead to cancer, you need Pap tests and screening for cancer for at least 20 years after your treatment.   If Pap tests have been discontinued, risk factors (such as a new sexual partner) need to be re-assessed to determine if screening should be resumed.   Some women may need screenings more often if they are at high risk for cervical cancer.  PREPARATION FOR A PAP TEST A Pap test should be performed during the weeks before the start of menstruation. Women should not douche or have sexual intercourse for 24 hours before the test. No vaginal creams, diaphragms, or tampons should be  used for 24 hours before the test. To minimize discomfort, a woman should empty her bladder just before the exam. TAKING THE PAP TEST The caregiver will perform a pelvic exam. A metal or plastic instrument (speculum) is placed in the vagina. This is done before your caregiver does a bimanual exam of your internal female organs. This instrument allows your caregiver to see the inside of the vagina and look at the cervix. A small, sterile brush is used to take a sample of cells from the internal opening of the cervix. A small wooden spatula is used to scrape the outside of the cervix. Neither of these two methods to collect cells will cause you pain. These two scrapings are placed on a glass slide or in a small bottle filled with a special liquid. The cells are looked at later under a microscope in a lab. A specialist will look at these cells and determine if the cells are normal. RESULTS OF YOUR PAP TEST  A healthy Pap test shows no abnormal cells or evidence of inflammation.   The presence of abnormally growing cells on the surface of the cervix may be reported as an abnormal Pap test. Different categories of findings are used to describe your Pap test. Your caregiver will go over the importance of these findings with you. The caregiver will then determine what follow-up is needed or when you should have your next pap test.   If you have had two or more abnormal Pap tests:   You may be asked to have a colposcopy. This is a test in which the cervix is viewed with a special lighted microscope.   A cervical tissue sample (biopsy) may also be needed. This involves taking a small tissue sample from the cervix. The sample is looked at under a microscope to find the cause of the abnormal cells. Make sure you find out the results of the Pap test. If you have not received the results within two weeks, contact your caregiver's office for the results. Do not assume everything is normal if you have not heard from  your caregiver or medical facility. It is important to follow up on all of your test results.  Document Released: 11/15/2002 Document Revised: 08/14/2011 Document Reviewed: 08/19/2011 Exit[employer] Patient Information 2012 [employer]. 

## 2012-02-18 ENCOUNTER — Encounter: Payer: Self-pay | Admitting: Internal Medicine

## 2012-05-05 ENCOUNTER — Ambulatory Visit (INDEPENDENT_AMBULATORY_CARE_PROVIDER_SITE_OTHER): Payer: Self-pay | Admitting: Internal Medicine

## 2012-05-05 ENCOUNTER — Encounter: Payer: Self-pay | Admitting: Internal Medicine

## 2012-05-05 VITALS — BP 128/81 | HR 104 | Temp 97.4°F | Ht 63.0 in | Wt 183.8 lb

## 2012-05-05 DIAGNOSIS — D649 Anemia, unspecified: Secondary | ICD-10-CM

## 2012-05-05 DIAGNOSIS — K5900 Constipation, unspecified: Secondary | ICD-10-CM

## 2012-05-05 DIAGNOSIS — E039 Hypothyroidism, unspecified: Secondary | ICD-10-CM

## 2012-05-05 DIAGNOSIS — N946 Dysmenorrhea, unspecified: Secondary | ICD-10-CM | POA: Insufficient documentation

## 2012-05-05 DIAGNOSIS — N920 Excessive and frequent menstruation with regular cycle: Secondary | ICD-10-CM

## 2012-05-05 LAB — CBC WITH DIFFERENTIAL/PLATELET
Eosinophils Absolute: 0.1 10*3/uL (ref 0.0–0.7)
Eosinophils Relative: 2 % (ref 0–5)
HCT: 40.8 % (ref 36.0–46.0)
Hemoglobin: 13.5 g/dL (ref 12.0–15.0)
Lymphs Abs: 1.9 10*3/uL (ref 0.7–4.0)
MCH: 26.2 pg (ref 26.0–34.0)
MCV: 79.2 fL (ref 78.0–100.0)
Monocytes Relative: 9 % (ref 3–12)
RBC: 5.15 MIL/uL — ABNORMAL HIGH (ref 3.87–5.11)

## 2012-05-05 MED ORDER — DOCUSATE SODIUM 100 MG PO CAPS: 100.0000 mg | ORAL_CAPSULE | Freq: Two times a day (BID) | ORAL | Status: AC

## 2012-05-05 MED ORDER — LEVOTHYROXINE SODIUM 100 MCG PO TABS: 100.0000 ug | ORAL_TABLET | Freq: Every day | ORAL | Status: DC

## 2012-05-05 NOTE — Assessment & Plan Note (Addendum)
Hemoglobin 9.8<9.3<12.8. Microcytic. Ferritin 21, iron 28 and  %sats- 6%. She probably has blood loss iron deficient anemia- from menorrhagia and irregular menstruation- which is probably from her thyroid issues. Although, as his continuous and debilitating, I will refer her to OB/GYN for further workup and management needs.  - Repeat CBC with differential today.

## 2012-05-05 NOTE — Patient Instructions (Signed)
Please make followup appointment in 3-4 months. Get the lab test done today for hemoglobin- I will give you a call if anything needs to be changed. Meanwhile, continue taking his Synthroid and iron.  Start taking Colace 100 mg by mouth twice a day or other stool softener to help constipation.

## 2012-05-05 NOTE — Assessment & Plan Note (Signed)
prescribed Colace 100 mg twice daily. Also advised to use any stool softeners as needed.

## 2012-05-05 NOTE — Assessment & Plan Note (Signed)
Continue Synthroid at 100 mcg daily. She saw Dr. Evlyn Kanner, her endocrinologist, last month and did not make any changes in Synthroid doses. Although I did not have TSH results from his office yet.

## 2012-05-05 NOTE — Assessment & Plan Note (Signed)
See assessment and plan for anemia.

## 2012-05-05 NOTE — Progress Notes (Signed)
  Subjective:    [name], female    DOB: 1986/03/22, 26 y.o.   MRN: [id number]  HPI patient is a pleasant 89 year woman with past history of Graves' disease- status post ablation, now hypothyroid on Synthroid, anemia, comes the clinic for followup visit.  She complains of prolonged periods and bleeding for past 3-4 cycles. She says that her periods last for about 2 weeks, and sometimes she might also have periods Twice a month. Her to do but has dropped to 9.5 from 12-13 previously. - She complains of feeling dizzy and more tired when she has these prolonged periods. -Pap smear was negative during last visit. I checked her anemia panel during last visit- which showed ferritin of 24 and low iron stores and sats. She was started on oral iron therapy which is taking daily.  She also complaints of constipation- which is possibly exacerbated by iron therapy. Magnesium citrate help her once- but made her go to bathroom all night so she stopped taking it.  She denies any fever, chills, nausea vomiting, palpitations, headache, chest pain, short of breath.    Review of Systems    as per history of present illness, all other systems reviewed and negative. Objective:   Physical Exam  General: NAD HEENT: PERRL, EOMI, no scleral icterus Cardiac: S1, S2, tachycardic, no rubs, murmurs or gallops Pulm: clear to auscultation bilaterally, moving normal volumes of air Abd: soft, nontender, nondistended, BS present Ext: warm and well perfused, no pedal edema Neuro: alert and oriented X3, cranial nerves II-XII grossly intact       Assessment & Plan:

## 2012-05-17 ENCOUNTER — Encounter: Payer: Self-pay | Admitting: Obstetrics & Gynecology

## 2012-06-02 ENCOUNTER — Encounter: Payer: Self-pay | Admitting: Obstetrics & Gynecology

## 2012-06-02 NOTE — Addendum Note (Signed)
Addended by: Neomia Dear on: 06/02/2012 06:07 PM   Modules accepted: Orders

## 2012-06-17 ENCOUNTER — Encounter (HOSPITAL_COMMUNITY): Payer: Self-pay | Admitting: *Deleted

## 2012-06-17 ENCOUNTER — Emergency Department (HOSPITAL_COMMUNITY)
Admission: EM | Admit: 2012-06-17 | Discharge: 2012-06-17 | Disposition: A | Discharge status: Home / Self Care | Payer: Self-pay | Attending: Emergency Medicine | Admitting: Emergency Medicine

## 2012-06-17 DIAGNOSIS — R51 Headache: Secondary | ICD-10-CM | POA: Insufficient documentation

## 2012-06-17 DIAGNOSIS — E039 Hypothyroidism, unspecified: Secondary | ICD-10-CM | POA: Insufficient documentation

## 2012-06-17 MED ORDER — ACETAMINOPHEN 325 MG PO TABS
650.0000 mg | ORAL_TABLET | Freq: Once | ORAL | Status: AC
  Administered 2012-06-17: 650 mg via ORAL
  Filled 2012-06-17: qty 2

## 2012-06-17 NOTE — ED Notes (Signed)
VISUAL ACUITY RESULTED:   L 20/50   R 20/50   B 20/30   With glasses on  Pt states she always wears her glasses, she cannot see without them

## 2012-06-17 NOTE — ED Provider Notes (Signed)
History     CSN: [id number]  Arrival date & time 06/17/12  0716   First MD Initiated Contact with Patient 06/17/12 [phone]      Chief Complaint  Patient presents with  . Headache    (Consider location/radiation/quality/duration/timing/severity/associated sxs/prior treatment) HPI Pt presents for blurred vision and frontal HA starting this AM. She states she has had previously similar HA's. No N/V, fever chills, palpitations, focal neuro deficits. No eye pain, redness, FB sensation. She is synthroid with no recent medication changes.  Pt is crying and states she has been under a lot of stress recently. States she recently broke up with her boyfriend and has been sad since. She denies SI/HI.  Past Medical History  Diagnosis Date  . Thyrotoxicosis   . Atrial tachycardia   . Dizziness   . Hypothyroid   . Depression   . Anxiety     History reviewed. No pertinent past surgical history.  History reviewed. No pertinent family history.  History  Substance Use Topics  . Smoking status: Never Smoker   . Smokeless tobacco: Never Used  . Alcohol Use: No    OB History    Grav Para Term Preterm Abortions TAB SAB Ect Mult Living                  Review of Systems  Constitutional: Negative for fever and chills.  HENT: Negative for neck pain.   Eyes: Positive for visual disturbance. Negative for pain and redness.  Respiratory: Negative for shortness of breath.   Cardiovascular: Negative for chest pain and palpitations.  Gastrointestinal: Negative for nausea and vomiting.  Skin: Negative for rash and wound.  Neurological: Positive for headaches. Negative for dizziness, syncope, weakness, light-headedness and numbness.    Allergies  Review of patient's allergies indicates no known allergies.  Home Medications   Current Outpatient Rx  Name Route Sig Dispense Refill  . FERROUS SULFATE 325 (65 FE) MG PO TABS Oral Take 325 mg by mouth daily with breakfast.    . LEVOTHYROXINE  SODIUM 100 MCG PO TABS Oral Take 1 tablet (100 mcg total) by mouth daily. 30 tablet 11    BP 199/71  Pulse 68  Temp 97.6 F (36.4 C)  Resp 18  SpO2 100%  Physical Exam  Nursing note and vitals reviewed. Constitutional: She is oriented to person, place, and time. She appears well-developed and well-nourished. No distress.       Crying but does not appear to be in any pain  HENT:  Head: Normocephalic and atraumatic.  Mouth/Throat: Oropharynx is clear and moist.  Eyes: EOM are normal. Pupils are equal, round, and reactive to light.       Mildly red conjunctiva, actively crying. No FB. EOMI.  Neck: Normal range of motion. Neck supple.  Cardiovascular: Normal rate and regular rhythm.   Pulmonary/Chest: Effort normal and breath sounds normal. No respiratory distress. She has no wheezes. She has no rales.  Abdominal: Soft. Bowel sounds are normal. She exhibits no distension. There is no tenderness. There is no rebound and no guarding.  Musculoskeletal: Normal range of motion. She exhibits no edema and no tenderness.  Neurological: She is alert and oriented to person, place, and time.       5/5 motor in all ext, sensation intact, ambulating without difficulty.   Skin: Skin is warm and dry. No rash noted. No erythema.    ED Course  Procedures (including critical care time)  Labs Reviewed - No data to display  No results found.   1. Headache       MDM  Suspect symptoms to be mood related. Will treat with tylenol and check VS and visual acuity        Loren Racer, MD 06/17/12 1540

## 2012-06-30 ENCOUNTER — Encounter: Payer: Self-pay | Admitting: Obstetrics & Gynecology

## 2012-06-30 ENCOUNTER — Ambulatory Visit (INDEPENDENT_AMBULATORY_CARE_PROVIDER_SITE_OTHER): Payer: No Typology Code available for payment source | Admitting: Obstetrics & Gynecology

## 2012-06-30 VITALS — BP 128/89 | HR 85 | Temp 97.8°F | Ht 63.0 in | Wt 187.1 lb

## 2012-06-30 DIAGNOSIS — N920 Excessive and frequent menstruation with regular cycle: Secondary | ICD-10-CM

## 2012-06-30 NOTE — Progress Notes (Signed)
[name], female   DOB: June 01, 1986, 26 y.o.   MRN: [id number]  Chief Complaint  Patient presents with  . Dysmenorrhea    last two weeks at times, heavy and painful    HPI Alyssa Hall is a 26 y.o. female.  Referred due to heayv periods and pain. Menses may last for 2 weeks. Never used BCM, does not request this although she has been sexually active with multiple partners HPI  Past Medical History  Diagnosis Date  . Thyrotoxicosis   . Atrial tachycardia   . Dizziness   . Hypothyroid   . Depression   . Anxiety     No past surgical history on file.  No family history on file.  Social History History  Substance Use Topics  . Smoking status: Never Smoker   . Smokeless tobacco: Never Used  . Alcohol Use: No    No Known Allergies  Current Outpatient Prescriptions  Medication Sig Dispense Refill  . ferrous sulfate 325 (65 FE) MG tablet Take 325 mg by mouth daily with breakfast.      . levothyroxine (SYNTHROID, LEVOTHROID) 100 MCG tablet Take 1 tablet (100 mcg total) by mouth daily.  30 tablet  11  . DISCONTD: atenolol (TENORMIN) 100 MG tablet Take 100 mg by mouth 2 (two) times daily as needed. For  tremors, palpitations, anxiety.        Review of Systems Review of Systems  Constitutional: Negative.   Gastrointestinal: Positive for constipation. Negative for abdominal pain and abdominal distention.  Genitourinary: Positive for vaginal discharge (with odor). Negative for dysuria, frequency and vaginal bleeding.    Blood pressure 128/89, pulse 85, temperature 97.8 F (36.6 C), temperature source Oral, height 5\' 3"  (1.6 m), weight 187 lb 1.6 oz (84.868 kg), last menstrual period 06/22/2012.  Physical Exam Physical Exam  Constitutional: She appears well-developed. No distress.  Pulmonary/Chest: Effort normal. No respiratory distress.  Abdominal: Soft. She exhibits no distension and no mass.  Genitourinary: Vaginal discharge (slight discharge with odor)  found.       Mild tenderness in adnexa, no mass  Skin: Skin is warm.  Psychiatric: She has a normal mood and affect. Her behavior is normal.    Data Reviewed Pap 02/2012 normal  Assessment    Dysmenorrhea, menorrhagia, pelvic pain     Plan    Pelvic US, GC and CT done, wet prep RTC 3 weeks for result       Hugo Lybrand 06/30/2012, 1:17 PM

## 2012-06-30 NOTE — Patient Instructions (Signed)

## 2012-07-01 LAB — WET PREP, GENITAL
Trich, Wet Prep: NONE SEEN
Yeast Wet Prep HPF POC: NONE SEEN

## 2012-07-05 ENCOUNTER — Telehealth: Payer: Self-pay

## 2012-07-05 NOTE — Telephone Encounter (Signed)
Called Alyssa Hall and notified her that her doctor wanted [place] to let her know that from her last visit she has BV and we will call in her prescription for flagyl 2gm po x1. Answered patient's questions and called in prescription to [facility] department pharmacy

## 2012-07-05 NOTE — Telephone Encounter (Signed)
Message copied by Faythe Casa on Mon Jul 05, 2012  1:31 PM ------      Message from: Adam Phenix      Created: Sat Jul 03, 2012  9:23 AM       May have flagyl 2g single dose for BV

## 2012-07-05 NOTE — Telephone Encounter (Signed)
Called pt and left message to please return the call to the clinics its concerning results but is not emergent.

## 2012-07-07 ENCOUNTER — Ambulatory Visit (INDEPENDENT_AMBULATORY_CARE_PROVIDER_SITE_OTHER): Payer: No Typology Code available for payment source | Admitting: Obstetrics & Gynecology

## 2012-07-07 ENCOUNTER — Telehealth: Payer: Self-pay | Admitting: *Deleted

## 2012-07-07 ENCOUNTER — Encounter: Payer: Self-pay | Admitting: Obstetrics & Gynecology

## 2012-07-07 VITALS — BP 126/83 | HR 86 | Temp 97.1°F | Resp 20 | Ht 63.0 in | Wt 187.8 lb

## 2012-07-07 DIAGNOSIS — N949 Unspecified condition associated with female genital organs and menstrual cycle: Secondary | ICD-10-CM

## 2012-07-07 DIAGNOSIS — R42 Dizziness and giddiness: Secondary | ICD-10-CM

## 2012-07-07 DIAGNOSIS — N925 Other specified irregular menstruation: Secondary | ICD-10-CM

## 2012-07-07 DIAGNOSIS — N938 Other specified abnormal uterine and vaginal bleeding: Secondary | ICD-10-CM

## 2012-07-07 LAB — CBC
MCHC: 33.7 g/dL (ref 30.0–36.0)
Platelets: 359 10*3/uL (ref 150–400)
RDW: 13.1 % (ref 11.5–15.5)
WBC: 6.1 10*3/uL (ref 4.0–10.5)

## 2012-07-07 MED ORDER — NORGESTIMATE-ETH ESTRADIOL 0.25-35 MG-MCG PO TABS: 1.0000 | ORAL_TABLET | Freq: Every day | ORAL | Status: DC

## 2012-07-07 NOTE — Telephone Encounter (Signed)
Patient called front desk and transferred to nurse. Initially complaining that she was given flagy 2 gm po x 1 that she took yesterday and c/o having dizziness. Discussed with her that dizziness is not a common side effect of flagyl, but if it is from flagyl should resolve quickly as it was a one time dose. Patient denies nausea and vomiting. Upon further discussion patient admits she has been having the dizziness since shortly after her cycle began on the 24th and it is when she is up and walking.  Per chart review patient seen recently for menorrhagia in October  but last hemoglobin is  from 05/05/12 normal . Offered patient appointment today at 3:30 which patient accepted.

## 2012-07-07 NOTE — Patient Instructions (Addendum)
Dysfunctional Uterine Bleeding  Normally, menstrual periods begin between ages 11 to 17 in young women. A normal menstrual cycle/period may begin every 23 days up to 35 days and lasts from 1 to 7 days. Around 12 to 14 days before your menstrual period starts, ovulation (ovary produces an egg) occurs. When counting the time between menstrual periods, count from the first day of bleeding of the previous period to the first day of bleeding of the next period.  Dysfunctional (abnormal) uterine bleeding is bleeding that is different from a normal menstrual period. Your periods may come earlier or later than usual. They may be lighter, have blood clots or be heavier. You may have bleeding between periods, or you may skip one period or more. You may have bleeding after sexual intercourse, bleeding after menopause, or no menstrual period.  CAUSES   · Pregnancy (normal, miscarriage, tubal).  · IUDs (intrauterine device, birth control).  · Birth control pills.  · Hormone treatment.  · Menopause.  · Infection of the cervix.  · Blood clotting problems.  · Infection of the inside lining of the uterus.  · Endometriosis, inside lining of the uterus growing in the pelvis and other female organs.  · Adhesions (scar tissue) inside the uterus.  · Obesity or severe weight loss.  · Uterine polyps inside the uterus.  · Cancer of the vagina, cervix, or uterus.  · Ovarian cysts or polycystic ovary syndrome.  · Medical problems (diabetes, thyroid disease).  · Uterine fibroids (noncancerous tumor).  · Problems with your female hormones.  · Endometrial hyperplasia, very thick lining and enlarged cells inside of the uterus.  · Medicines that interfere with ovulation.  · Radiation to the pelvis or abdomen.  · Chemotherapy.  DIAGNOSIS   · Your doctor will discuss the history of your menstrual periods, medicines you are taking, changes in your weight, stress in your life, and any medical problems you may have.  · Your doctor will do a physical  and pelvic examination.  · Your doctor may want to perform certain tests to make a diagnosis, such as:  · Pap test.  · Blood tests.  · Cultures for infection.  · CT scan.  · Ultrasound.  · Hysteroscopy.  · Laparoscopy.  · MRI.  · Hysterosalpingography.  · D and C.  · Endometrial biopsy.  TREATMENT   Treatment will depend on the cause of the dysfunctional uterine bleeding (DUB). Treatment may include:  · Observing your menstrual periods for a couple of months.  · Prescribing medicines for medical problems, including:  · Antibiotics.  · Hormones.  · Birth control pills.  · Removing an IUD (intrauterine device, birth control).  · Surgery:  · D and C (scrape and remove tissue from inside the uterus).  · Laparoscopy (examine inside the abdomen with a lighted tube).  · Uterine ablation (destroy lining of the uterus with electrical current, laser, heat, or freezing).  · Hysteroscopy (examine cervix and uterus with a lighted tube).  · Hysterectomy (remove the uterus).  HOME CARE INSTRUCTIONS   · If medicines were prescribed, take exactly as directed. Do not change or switch medicines without consulting your caregiver.  · Long term heavy bleeding may result in iron deficiency. Your caregiver may have prescribed iron pills. They help replace the iron that your body lost from heavy bleeding. Take exactly as directed.  · Do not take aspirin or medicines that contain aspirin one week before or during your menstrual period. Aspirin may make   the bleeding worse.  · If you need to change your sanitary pad or tampon more than once every 2 hours, stay in bed with your feet elevated and a cold pack on your lower abdomen. Rest as much as possible, until the bleeding stops or slows down.  · Eat well-balanced meals. Eat foods high in iron. Examples are:  · Leafy green vegetables.  · Whole-grain breads and cereals.  · Eggs.  · Meat.  · Liver.  · Do not try to lose weight until the abnormal bleeding has stopped and your blood iron level is  back to normal. Do not lift more than ten pounds or do strenuous activities when you are bleeding.  · For a couple of months, make note on your calendar, marking the start and ending of your period, and the type of bleeding (light, medium, heavy, spotting, clots or missed periods). This is for your caregiver to better evaluate your problem.  SEEK MEDICAL CARE IF:   · You develop nausea (feeling sick to your stomach) and vomiting, dizziness, or diarrhea while you are taking your medicine.  · You are getting lightheaded or weak.  · You have any problems that may be related to the medicine you are taking.  · You develop pain with your DUB.  · You want to remove your IUD.  · You want to stop or change your birth control pills or hormones.  · You have any type of abnormal bleeding mentioned above.  · You are over 16 years old and have not had a menstrual period yet.  · You are 26 years old and you are still having menstrual periods.  · You have any of the symptoms mentioned above.  · You develop a rash.  SEEK IMMEDIATE MEDICAL CARE IF:   · An oral temperature above 102° F (38.9° C) develops.  · You develop chills.  · You are changing your sanitary pad or tampon more than once an hour.  · You develop abdominal pain.  · You pass out or faint.  Document Released: 08/22/2000 Document Revised: 11/17/2011 Document Reviewed: 07/24/2009  [employer]® Patient Information ©2013 [employer].

## 2012-07-07 NOTE — Progress Notes (Signed)
Subjective:     [name], female   DOB: 05/26/1986, 26 y.o.   MRN: [id number]  HPI Pt c/o dizziness with bleeding.  Pt has not had her TSH checked in a long time >70months She reports heavy cycles for 1year and has not had a workup.  Has a sono scheduled in 2 weeks.  Pt has not been sexually active for >9month.    Review of Systems     Objective:   Physical ExamBP 126/83  Pulse 86  Temp 97.1 F (36.2 C) (Oral)  Resp 20  Ht 5\' 3"  (1.6 m)  Wt 187 lb 12.8 oz (85.186 kg)  BMI 33.27 kg/m2  LMP 07/01/2012  Exam deferred pt told nurse that she didn't want exam due to bleeding     Assessment:     DUB in pt with a h/o hypothroidism.  Will begin OCP's and get labs.  Pt Scheduled for Sono and appt with Dr. Debroah Loop in 2 weeks.      Plan:     Labs today: TSH, CBC Exam on next visit when pt not on menses  Sprintec 1 po q day

## 2012-07-07 NOTE — Progress Notes (Signed)
Pt reports that she has been having dizziness for approx. 4 mos. She did not mention this @ her recent clinic visit, but spoke w/clinic RN today and was given appt.

## 2012-07-08 LAB — TSH: TSH: 0.707 u[IU]/mL (ref 0.350–4.500)

## 2012-07-12 ENCOUNTER — Encounter: Payer: Self-pay | Admitting: Internal Medicine

## 2012-07-12 ENCOUNTER — Ambulatory Visit (INDEPENDENT_AMBULATORY_CARE_PROVIDER_SITE_OTHER): Payer: No Typology Code available for payment source | Admitting: Internal Medicine

## 2012-07-12 VITALS — BP 143/89 | HR 104 | Temp 97.0°F | Ht 63.0 in | Wt 189.2 lb

## 2012-07-12 DIAGNOSIS — D649 Anemia, unspecified: Secondary | ICD-10-CM

## 2012-07-12 DIAGNOSIS — H8102 Meniere's disease, left ear: Secondary | ICD-10-CM

## 2012-07-12 DIAGNOSIS — H8109 Meniere's disease, unspecified ear: Secondary | ICD-10-CM | POA: Insufficient documentation

## 2012-07-12 DIAGNOSIS — F411 Generalized anxiety disorder: Secondary | ICD-10-CM

## 2012-07-12 DIAGNOSIS — N949 Unspecified condition associated with female genital organs and menstrual cycle: Secondary | ICD-10-CM

## 2012-07-12 DIAGNOSIS — E039 Hypothyroidism, unspecified: Secondary | ICD-10-CM

## 2012-07-12 DIAGNOSIS — N938 Other specified abnormal uterine and vaginal bleeding: Secondary | ICD-10-CM

## 2012-07-12 DIAGNOSIS — N925 Other specified irregular menstruation: Secondary | ICD-10-CM

## 2012-07-12 MED ORDER — FERROUS SULFATE 325 (65 FE) MG PO TABS: 325.0000 mg | ORAL_TABLET | Freq: Every day | ORAL | Status: DC

## 2012-07-12 NOTE — Patient Instructions (Signed)
Please make followup appointment after you come back from [place] or earlier if needed. Followup with gynecologist and ENT and follow the recommendations. Continue taking Synthroid and iron tablets as you do. Start taking contraceptive pills as recommended by gynecologist.

## 2012-07-12 NOTE — Assessment & Plan Note (Signed)
Hemoglobin within normal range now- 12.9 which improved MCV- 81.3. Continue iron replacement for next 3 months and then stop if her bleeding gets under control. She has a followup appointment with OB/GYN on 07/20/2012 for ultrasound and then appointment with physician on 07/22/2012. Discussed with her in detail about importance of starting OCPs as recommended by GYN. She will get the prescription filled soon.

## 2012-07-12 NOTE — Assessment & Plan Note (Signed)
Continue Synthroid 100 mcg daily. Last TSH 0.707 about a week before- well suppressed.
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
## 2012-07-12 NOTE — Assessment & Plan Note (Addendum)
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  Attacks lasts about 2-5 minutes about 3-4 times a day- comes when she is working. Improves with rest. Never occurs when she is sitting or resting. Other differentials include migraine ( highly unlikely ) versus brain pathology ( unlikely ). - will make a referral to ENT for further diagnostic and management help. She will need audiometric testing and further management after that as felt needed per ENT physician. - appointment at [facility] on 07/19/2012. Would like to get it done before she flies to [place] for vacation at end this month.

## 2012-07-12 NOTE — Progress Notes (Signed)
  Subjective:    [name], female    DOB: [birth year], 26 y.o.   MRN: [id number]  HPIpatient is a pleasant 26 year woman with hypothyroidism status post radioablation for Graves, DUB who comes the clinic for followup visit. She complains of dizziness which did not improve after treatment of her anemia. She had anemia in may 2013 when she started having irregular menstrual bleeding. She was seen in GYN clinic last week and was recommended to start OCP- which she hadn't yet as she had some concerns. She still complains of dizziness- described as left ear fullness, had fullness, ringing in the ear and some hearing loss with sense of falling down, tearing in both eyes. Does not have any nausea, vomiting, spinning sensation. Does not have any associated headache, vision changes.  She denies any fever, chills, chest pain, short of breath, abdominal pain, diarrhea.  Review of Systems    as per history of present illness, all other systems reviewed and negative. Objective:   Physical Exam  Constitutional: Vital signs reviewed.  Patient is a well-developed and well-nourished in no acute distress and cooperative with exam. Alert and oriented x3.  Head: Normocephalic and atraumatic Ear: TM normal bilaterally Mouth: no erythema or exudates, MMM Eyes: PERRL, EOMI, conjunctivae normal, No scleral icterus.  Neck: Supple, Trachea midline normal ROM, No JVD Cardiovascular: RRR, S1 normal, S2 normal, no MRG, pulses symmetric and intact bilaterally Pulmonary/Chest: CTAB, no wheezes, rales, or rhonchi Abdominal: Soft. Non-tender, non-distended, bowel sounds are normal Musculoskeletal: No joint deformities, erythema, or stiffness, ROM full and no nontender Neurological: A&O x3, Strength is normal and symmetric bilaterally, cranial nerve II-XII are grossly intact, no focal motor deficit, sensory intact to light touch bilaterally.  Skin: Warm, dry and intact. No rash, cyanosis, or clubbing.    Psychiatric: Normal mood and affect. speech and behavior is normal. Judgment and thought content normal. Cognition and memory are normal.         Assessment & Plan:

## 2012-07-12 NOTE — Assessment & Plan Note (Signed)
Followup with GYN with ultrasound and physician appointment in 2 weeks as scheduled. Start taking OCPs as described in anemia.

## 2012-07-19 ENCOUNTER — Ambulatory Visit (HOSPITAL_COMMUNITY): Payer: Self-pay

## 2012-07-19 DIAGNOSIS — H9190 Unspecified hearing loss, unspecified ear: Secondary | ICD-10-CM | POA: Insufficient documentation

## 2012-07-19 DIAGNOSIS — H9319 Tinnitus, unspecified ear: Secondary | ICD-10-CM | POA: Insufficient documentation

## 2012-07-19 DIAGNOSIS — R42 Dizziness and giddiness: Secondary | ICD-10-CM | POA: Insufficient documentation

## 2012-07-20 ENCOUNTER — Ambulatory Visit (HOSPITAL_COMMUNITY)
Admission: RE | Admit: 2012-07-20 | Discharge: 2012-07-20 | Disposition: A | Discharge status: Home / Self Care | Payer: Self-pay | Source: Ambulatory Visit | Attending: Obstetrics & Gynecology | Admitting: Obstetrics & Gynecology

## 2012-07-20 ENCOUNTER — Other Ambulatory Visit: Payer: Self-pay | Admitting: Obstetrics & Gynecology

## 2012-07-20 DIAGNOSIS — N83209 Unspecified ovarian cyst, unspecified side: Secondary | ICD-10-CM | POA: Insufficient documentation

## 2012-07-20 DIAGNOSIS — N920 Excessive and frequent menstruation with regular cycle: Secondary | ICD-10-CM

## 2012-07-20 DIAGNOSIS — D259 Leiomyoma of uterus, unspecified: Secondary | ICD-10-CM | POA: Insufficient documentation

## 2012-07-20 DIAGNOSIS — N946 Dysmenorrhea, unspecified: Secondary | ICD-10-CM | POA: Insufficient documentation

## 2012-07-22 ENCOUNTER — Ambulatory Visit (INDEPENDENT_AMBULATORY_CARE_PROVIDER_SITE_OTHER): Payer: No Typology Code available for payment source | Admitting: Obstetrics & Gynecology

## 2012-07-22 ENCOUNTER — Encounter: Payer: Self-pay | Admitting: Obstetrics & Gynecology

## 2012-07-22 VITALS — BP 135/88 | HR 110 | Temp 99.3°F | Ht 63.0 in | Wt 188.0 lb

## 2012-07-22 DIAGNOSIS — N949 Unspecified condition associated with female genital organs and menstrual cycle: Secondary | ICD-10-CM

## 2012-07-22 DIAGNOSIS — N925 Other specified irregular menstruation: Secondary | ICD-10-CM

## 2012-07-22 DIAGNOSIS — N938 Other specified abnormal uterine and vaginal bleeding: Secondary | ICD-10-CM

## 2012-07-22 MED ORDER — NORGESTIMATE-ETH ESTRADIOL 0.25-35 MG-MCG PO TABS: 1.0000 | ORAL_TABLET | Freq: Every day | ORAL | Status: DC

## 2012-07-22 NOTE — Patient Instructions (Signed)
Uterine Fibroid  A uterine fibroid is a growth (tumor) that occurs in a woman's uterus. This type of tumor is not cancerous and does not spread out of the uterus. A woman can have one or many fibroids, and the fiboid(s) can become quite large. A fibroid can vary in size, weight, and where it grows in the uterus. Most fibroids do not require medical treatment, but some can cause pain or heavy bleeding during and between periods.  CAUSES   A fibroid is the result of a single uterine cell that keeps growing (unregulated), which is different than most cells in the human body. Most cells have a control mechanism that keeps them from reproducing without control.   SYMPTOMS    Bleeding.   Pelvic pain and pressure.   Bladder problems due to the size of the fibroid.   Infertility and miscarriages depending on the size and location of the fibroid.  DIAGNOSIS   A diagnosis is made by physical exam. Your caregiver may feel the lumpy tumors during a pelvic exam. Important information regarding size, location, and number of tumors can be gained by having an ultrasound. It is rare that other tests, such as a CT scan or MRI, are needed.  TREATMENT    Your caregiver may recommend watchful waiting. This involves getting the fibroid checked by your caregiver to see if the fibroids grow or shrink.    Hormonal treatment or an intrauterine device (IUD) may be prescribed.    Surgery may be needed to remove the fibroids (myomectomy) or the uterus (hysterectomy). This depends on your situation.  When fibroids interfere with fertility and a woman wants to become pregnant, a caregiver may recommend having the fibroids removed.   HOME CARE INSTRUCTIONS   Home care depends on how you were treated. In general:    Keep all follow-up appointments with your caregiver.    Only take medicine as told by your caregiver. Do not take aspirin. It can cause bleeding.    If you have excessive periods and soak tampons or pads in a half hour or  less, contact your caregiver immediately. If your periods are troublesome but not so heavy, lie down with your feet raised slightly above your heart. Place cold packs on your lower abdomen.    If your periods are heavy, write down the number of pads or tampons you use per month. Bring this information to your caregiver.    Talk to your caregiver about taking iron pills.    Include green vegetables in your diet.    If you were prescribed a hormonal treatment, take the hormonal medicines as directed.    If you need surgery, ask your caregiver for information on your specific surgery.   SEEK IMMEDIATE MEDICAL CARE IF:   You have pelvic pain or cramps not controlled with medicines.    You have a sudden increase in pelvic pain.    You have an increase of bleeding between and during periods.    You feel lightheaded or have fainting episodes.   MAKE SURE YOU:   Understand these instructions.   Will watch your condition.   Will get help right away if you are not doing well or get worse.  Document Released: 08/22/2000 Document Revised: 11/17/2011 Document Reviewed: 09/15/2011  Exit[employer] Patient Information 2013 [employer].

## 2012-07-22 NOTE — Progress Notes (Addendum)
  Subjective:    [name], female    DOB: 1986-04-16, 26 y.o.   MRN: [id number]  HPIPatient's last menstrual period was 07/01/2012. G1P0 She started OCP at last visit and she has no complaints.     Review of Systems  Constitutional: Negative for activity change.  Gastrointestinal: Negative for abdominal pain.  Genitourinary: Negative for vaginal bleeding, vaginal discharge and menstrual problem.  Neurological: Positive for dizziness.       Objective:   Physical Exam  Constitutional: She appears well-nourished. No distress.  Psychiatric: She has a normal mood and affect. Her behavior is normal.   Filed Vitals:   07/22/12 1354  Height: 5\' 3"  (1.6 m)  Weight: 188 lb (85.276 kg)   Filed Vitals:   07/22/12 1354  BP: 135/88  Pulse: 110  Temp: 99.3 F (37.4 C)  TempSrc: Oral  Height: 5\' 3"  (1.6 m)  Weight: 188 lb (85.276 kg)        *RADIOLOGY REPORT*  Clinical Data: Menorrhagia. Dysmenorrhea.  TRANSABDOMINAL AND TRANSVAGINAL ULTRASOUND OF PELVIS  Technique: Both transabdominal and transvaginal ultrasound  examinations of the pelvis were performed. Transabdominal  technique was performed for global imaging of the pelvis including  uterus, ovaries, adnexal regions, and pelvic cul-de-sac.  It was necessary to proceed with endovaginal exam following the  transabdominal exam to visualize the endometrium and ovaries.  Comparison: None.  Findings:  Uterus: 11.7 x 7.4 x 7.4 cm. Several small uterine fibroids are  identified in the uterine corpus and fundus. The largest fibroid  in the anterior fundal region measures 2.6 cm.  Endometrium: Double layer thickness measures 18 mm transvaginally.  No focal lesion visualized.  Right ovary: A simple cyst or follicle is seen measuring 2.6 x 2.9  x 2.7 cm. No complex cystic or solid mass identified.  Left ovary: A complex cyst with fine reticular internal echoes and  no central blood flow is seen in the ovary  measuring 2.1 cm,  consistent with a benign hemorrhagic cyst.  Other Findings: No free fluid  IMPRESSION:  1. Several small uterine fibroids, largest measuring 2.6 cm.  2. Endometrial thickness measures 18 mm. If bleeding remains  unresponsive to hormonal or medical therapy, focal lesion work-up  with sonohysterogram should be considered. Endometrial biopsy  should also be considered in pre-menopausal patients at high risk  for endometrial carcinoma. (Ref: Radiological Reasoning:  Algorithmic Workup of Abnormal Vaginal Bleeding with Endovaginal  Sonography and Sonohysterography. AJR 2008; 213:Y86-57)  3. 2 cm benign appearing hemorrhagic cyst in the left ovary.  Original Report Authenticated By: Myles Rosenthal, M.D.      Assessment & Plan:  Fibroid uterus Menorrhagia controlled on OCP.   RTC ^ month  Elia Keenum 07/22/2012 2:36 PM

## 2012-07-23 ENCOUNTER — Other Ambulatory Visit (HOSPITAL_COMMUNITY): Payer: Self-pay

## 2012-08-10 ENCOUNTER — Telehealth: Payer: Self-pay | Admitting: *Deleted

## 2012-08-10 NOTE — Telephone Encounter (Signed)
That's perfectly all right. Thank you. Alyssa Hall.

## 2012-08-10 NOTE — Telephone Encounter (Signed)
Call from [facility] stating they can not get levothyroxine but can supply synthroid 100 mcg daily.   I okayed the change.  Is this okay with you?

## 2012-10-18 ENCOUNTER — Telehealth: Payer: Self-pay | Admitting: *Deleted

## 2012-10-18 NOTE — Telephone Encounter (Signed)
That's ok  Thanks

## 2012-10-18 NOTE — Telephone Encounter (Signed)
Call from [facility] ([place]) stating pt has been receiving brand name synthroid for last 2 refills.  They can only get levothyroxine at this time.  She was on the generic before.  Will this change back to levothyroxine be okay with you?

## 2012-10-18 NOTE — Telephone Encounter (Signed)
Pharmacy informed.

## 2012-10-23 ENCOUNTER — Other Ambulatory Visit: Payer: Self-pay

## 2012-11-10 ENCOUNTER — Encounter: Payer: No Typology Code available for payment source | Admitting: Internal Medicine

## 2012-11-18 ENCOUNTER — Ambulatory Visit (INDEPENDENT_AMBULATORY_CARE_PROVIDER_SITE_OTHER): Payer: No Typology Code available for payment source | Admitting: Internal Medicine

## 2012-11-18 ENCOUNTER — Encounter: Payer: Self-pay | Admitting: Internal Medicine

## 2012-11-18 VITALS — BP 118/82 | HR 90 | Temp 97.1°F | Ht 63.0 in | Wt 191.9 lb

## 2012-11-18 DIAGNOSIS — J309 Allergic rhinitis, unspecified: Secondary | ICD-10-CM

## 2012-11-18 DIAGNOSIS — H538 Other visual disturbances: Secondary | ICD-10-CM

## 2012-11-18 DIAGNOSIS — H1010 Acute atopic conjunctivitis, unspecified eye: Secondary | ICD-10-CM

## 2012-11-18 DIAGNOSIS — G245 Blepharospasm: Secondary | ICD-10-CM

## 2012-11-18 DIAGNOSIS — H1012 Acute atopic conjunctivitis, left eye: Secondary | ICD-10-CM

## 2012-11-18 MED ORDER — LORATADINE 10 MG PO TABS
10.0000 mg | ORAL_TABLET | Freq: Every day | ORAL | Status: DC | PRN
Start: 2012-11-18 — End: 2013-02-04

## 2012-11-18 NOTE — Assessment & Plan Note (Signed)
For several months has noted occasional L upper eyelid spasm, comes and goes. Has noticed recent increase in stress (working and attending school) and notes fatigue and usually few hours of sleep per night. No other focal neurological deficits noted.  Counseled that her symptoms are most likely related to stress and fatigue, not on meds typically associated with drug-induced blepharospasm.  Provided counseling on strategies to increase sleep and decrease stress, provided literature on nonpharmacologic interventions to ease symptoms.

## 2012-11-18 NOTE — Patient Instructions (Addendum)
1. You can take Claritin 10mg  one tablet daily. If your eye redness/itchiness does not improve, or you develop discharge, pain or new changes in your vision, please call the clinic and come back for another appointment. 2. You can take ibuprofen over the counter, 600 mg (3 tablets) every 6 hours as needed for your ankle pain, only when you have pain.  3. The best thing you can do for your eyelid spasm is get rest! More information below 4. Come back and see Dr. Allena Katz in the next couple of months.     Blepharospasm: Eyelid spasm Blepharospasm is an abnormal, involuntary blinking, movement, or spasm of the eyelids. CAUSES  Anyone can have an eye twitch from time to time. In most cases, there is no clear cause. Eyelid spasms may be associated with or prolonged by:   Alcohol.  Caffeine.  Fatigue.  Irritation of the eye surface or inner eyelids.  Lack of sleep.  Physical exertion.  Smoking.  Stress. Chronic, uncontrollable eyelid movement affecting both eyes is known as benign essential blepharospasm. This is not a common problem. Although its exact cause is unknown, the following conditions sometimes come before or during a problem of benign essential blepharospasm:   Eyelid infection (blepharitis).  Dry eyes.  Light sensitivity.  Pink eye (conjunctivitis). Rarely, eye twitch may be a sign of certain brain and nerve disorders. When it is, there are almost always other signs and symptoms. Brain and nerve disorders that can cause eye twitch include:   Bell's palsy.  Benign essential blepharospasm.  Dystonia.  Parkinson's Disease.  Side effects of drugs, particularly medications used to treat epilepsy and psychosis.  Spasmodic torticollis (a different type of muscle spasm sometimes accompanied by blepharospasm).  Tourette syndrome. SYMPTOMS  Random eye twitching can come and go for a few days, weeks or months. The spasms do not hurt, but they can be annoying. In its most  common, harmless form, eye twitching stops on its own. It may recur off and on for no reason. The onset of blepharospasm may include:   The development of blepharospasm without any warning signs.  A gradual increase in blinking or eye irritation.  Other symptoms including:  Fatigue.  Emotional tension.  Sensitivity to bright light. Symptoms may lessen or stop while:  A person is sleeping.  Concentrating on a specific task. If the condition progresses, the symptoms become more frequent. Facial spasms may develop. This is rare and may be the earliest sign of a more chronic movement disorder. TREATMENT  To date, there is no successful cure. Several treatment options can reduce its severity.  In some places, Botox shots into the muscles of the eyelids is an approved treatment. Botulinum toxin temporarily paralyzes the muscles of the eyelids.  Other medications can have unpredictable results. Any symptom relief is usually short term. They tend to be helpful in only a small percentage of cases.  A surgical procedure to remove some of the muscles and nerves of the eyelids (myectomy) is also an option. This surgery has improved symptoms in 75 to 85 percent of people.  Alternative treatments (the benefits of these alternative therapies have not been proven) may include:  Biofeedback.  Acupuncture.  Hypnosis.  Chiropractic treatment.  Nutritional therapy. FOR MORE INFORMATION National Eye Institute: [email or web address] Document Released: 08/28/2003 Document Revised: 11/17/2011 Document Reviewed: 07/13/2007 [facility] Patient Information 2013 [place], [place].

## 2012-11-18 NOTE — Assessment & Plan Note (Signed)
Wears glasses but has not had vision checked in several years and believes bilateral vision gradually worsening, has trouble with distance vision. Would like referral to optometrist. - Referral made today

## 2012-11-18 NOTE — Progress Notes (Signed)
[name], female   DOB: [birth year], 27 y.o.   MRN: [id number]  Subjective:   [name]   DOB: 1985/10/07 26 y.o.   MRN: [id number]  HPI: [name] is a 27 y.o. 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 She has had symptoms since December 2013. Has noticed increasing redness/itching of L eye and sometimes clear drainage. Occasional nasal drainage accompanies sx. She thinks bilateral vision has been gradually worsening as it has been several years since she has had eyes checked and does not wear her glasses regularly. Denies pain/photophobia. No neck pain or HA.  She last followed with endocrinologist Dr. Evlyn Kanner last month for hypothyroidism. She reports taking of levothyroxine daily. Last TSH in our clinic October 2013 was wnl. She says that Dr. Evlyn Kanner drew labs during their visit last month and that thyroid function was normal.  Of note, she last saw PCP Dr. Allena Katz 07/12/12 and was complaining of dizziness and L ear fullness/tinnitus. She was referred to [facility] for further work up. Per outside records, she went to initial appointment with ENT in mid-November, at which time the specialist felt that her presentation was not consistent with vertigo. She was scheduled to undergo audiometry. She has not yet completed this evaluation because she went on vacation to [place]. Reports persistent dizziness and L ear fullness/hearing loss, has not worsened since last visit. No syncopal events. Denies CP, SOB, N/V,  abdominal pain, diarrhea.     Past Medical History  Diagnosis Date  . Thyrotoxicosis   . Atrial tachycardia   . Dizziness   . Hypothyroid   . Depression   . Anxiety    Current Outpatient Prescriptions  Medication Sig Dispense Refill  . ferrous sulfate 325 (65 FE) MG  tablet Take 1 tablet (325 mg total) by mouth daily with breakfast.  30 tablet  5  . levothyroxine (SYNTHROID, LEVOTHROID) 100 MCG tablet Take 1 tablet (100 mcg total) by mouth daily.  30 tablet  11  . norgestimate-ethinyl estradiol (ORTHO-CYCLEN,SPRINTEC,PREVIFEM) 0.25-35 MG-MCG tablet Take 1 tablet by mouth daily.  3 Package  3  . [DISCONTINUED] atenolol (TENORMIN) 100 MG tablet Take 100 mg by mouth 2 (two) times daily as needed. For  tremors, palpitations, anxiety.       No current facility-administered medications for this visit.   No family history on file. History   Social History  . Marital Status: Single    Spouse Name: N/A    Number of Children: N/A  . Years of Education: N/A   Social History Main Topics  . Smoking status: Never Smoker   . Smokeless tobacco: Never Used  . Alcohol Use: No  . Drug Use: No  . Sexually Active: Not Currently    Birth Control/ Protection: None   Other Topics Concern  . Not on file   Social History Narrative   Uninsured.   Lives in [place].         Review of Systems: 10 pt ROS performed, pertinent positives and negatives noted in HPI Objective:  Physical Exam: There were no vitals filed for this visit. Constitutional: Vital signs reviewed.  Patient is a well-developed and well-nourished female in no acute distress and cooperative with exam. Alert and oriented x3.  Head: Normocephalic and atraumatic Mouth: No erythema or exudates, MMM  Eyes: Conjunctiva of L eye mildly injected. No drainage noted. PERRLA, no photophobia Neck: Supple, Trachea midline normal ROM y.  Cardiovascular: RRR w HR in 90s, S1 normal, S2 normal, no MRG, pulses symmetric and intact bilaterally Pulmonary/Chest: CTAB, no wheezes, rales, or rhonchi Abdominal: Soft. Non-tender, non-distended, bowel sounds are normal, no masses, organomegaly, or guarding present.  Musculoskeletal: No joint deformities, erythema, or stiffness, ROM full and no nontender Hematology: no  cervical adenopathy or visible bruising Neurological: A&O x3, Strength is normal and symmetric bilaterally, cranial nerve II-XII are grossly intact, no focal motor deficit, sensory intact to light touch bilaterally.  Skin: Warm, dry and intact. No rash, cyanosis, or clubbing.  Psychiatric: Normal mood and affect. Assessment & Plan:   Please see problem-based charting for assessment and plan.

## 2012-11-18 NOTE — Assessment & Plan Note (Signed)
Has had several months symptoms of L eye redness/itching as well as nasal congestion that comes and goes. No photophobia, active drainage. No limitation of EOM.  I think this is most likely allergic as it has been ongoing for several months now. Clinically does not appear consistent with viral or bacterial conjunctivitis.  Would consider nasal steroids but patient with financial limitations. Provided 3 mo trial of claritin. If this therapy fails, will add on nasal CS if she can afford.  Educated patient on warning symptoms (photophobia, purulent drainage, acute worsening of visual acuity) and to seek medical attention if these occur.

## 2012-11-19 IMAGING — US US SOFT TISSUE HEAD/NECK
1 series · 14 of 25 positions shown · non-contrast
Comparison: None.

CLINICAL DATA: Enlarged thyroid gland

THYROID ULTRASOUND
TECHNIQUE: Ultrasound examination of the thyroid gland and adjacent
soft tissues was performed.

[Series 1: us soft tissue head/neck · 0.07mm/px · 14 of 46 slices shown]
[im 1/46]
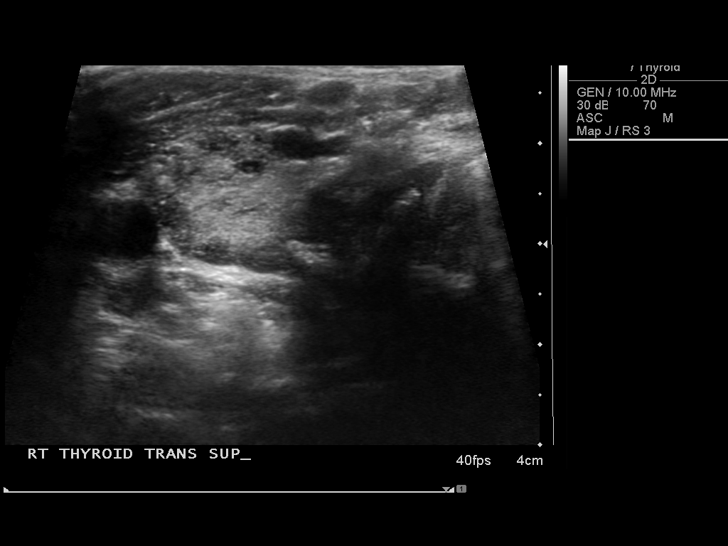
[im 4/46]
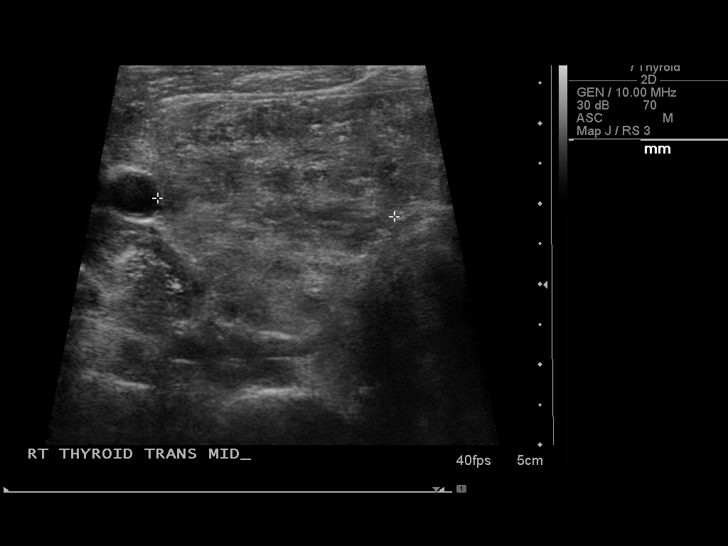
[im 8/46]
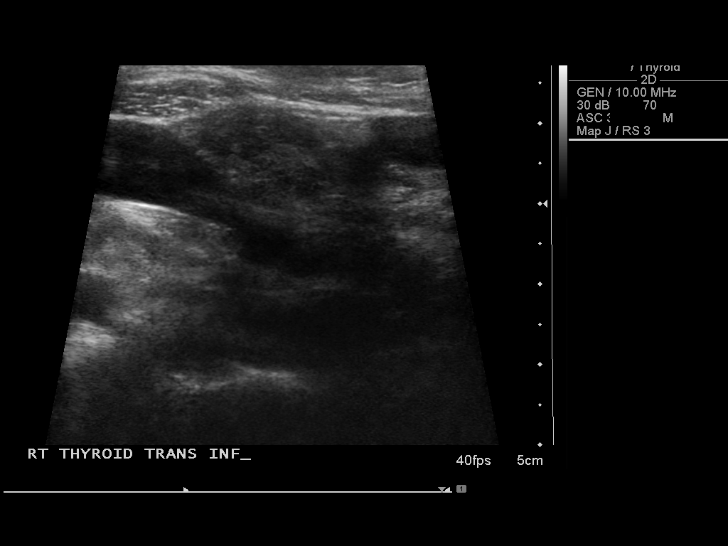
[im 12/46]
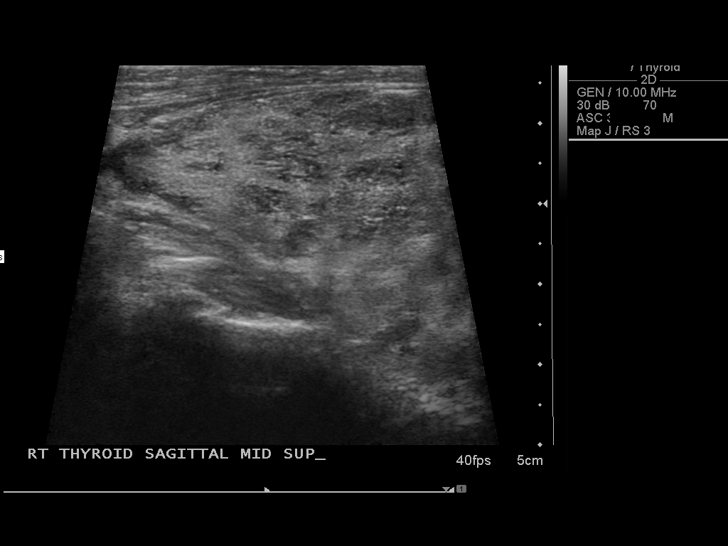
[im 16/46]
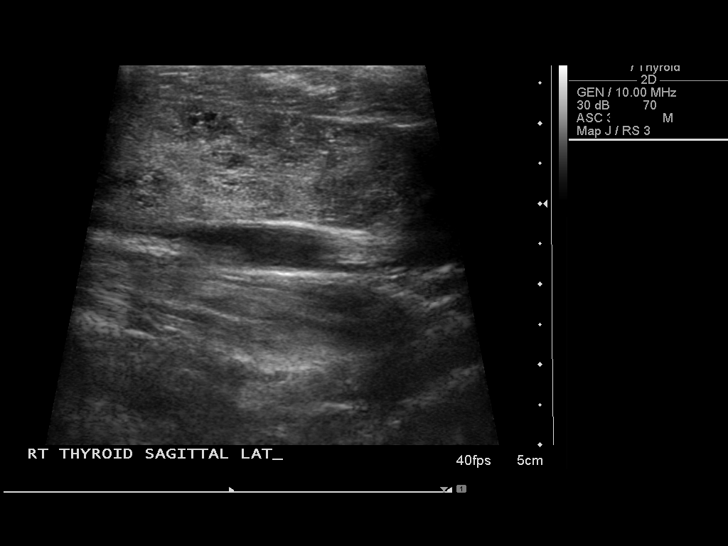
[im 17/46]
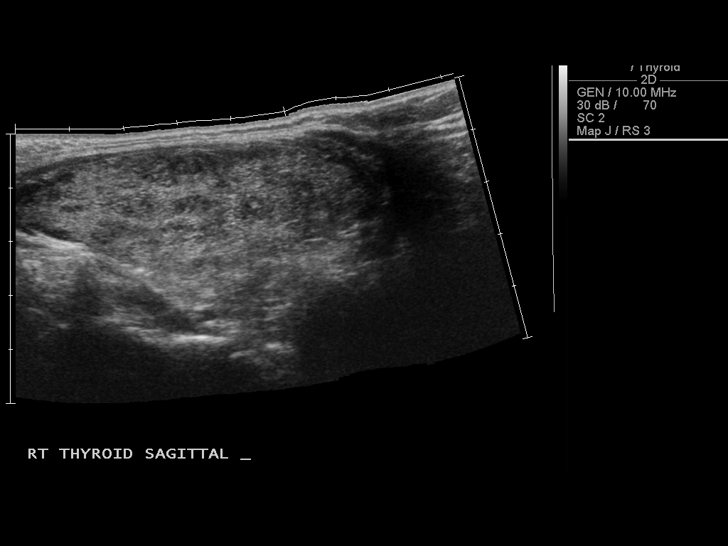
[im 21/46]
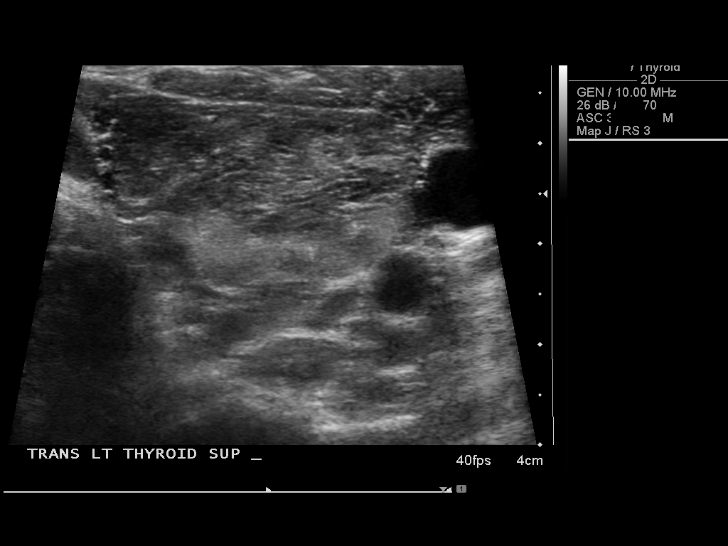
[im 25/46]
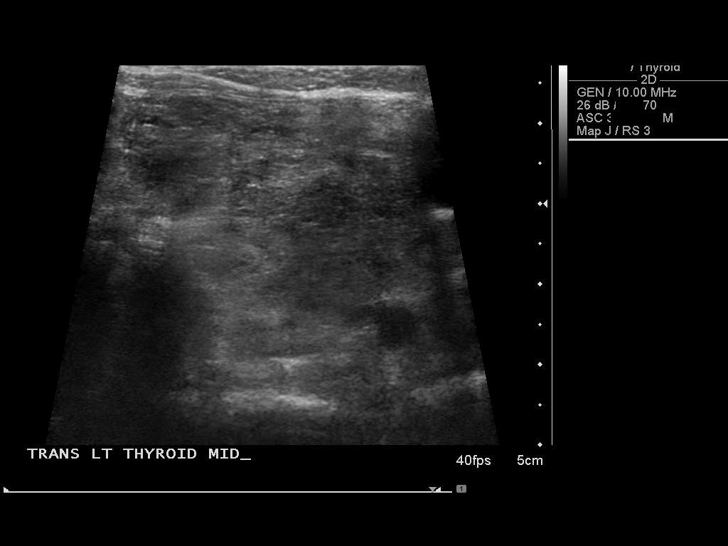
[im 29/46]
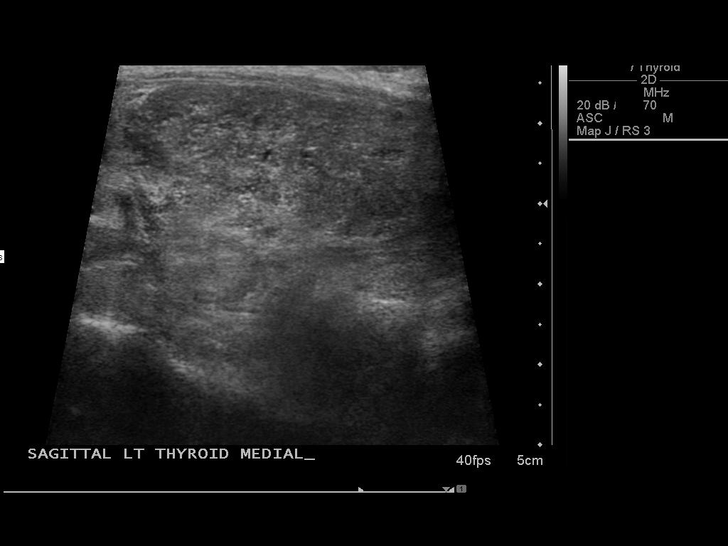
[im 31/46]
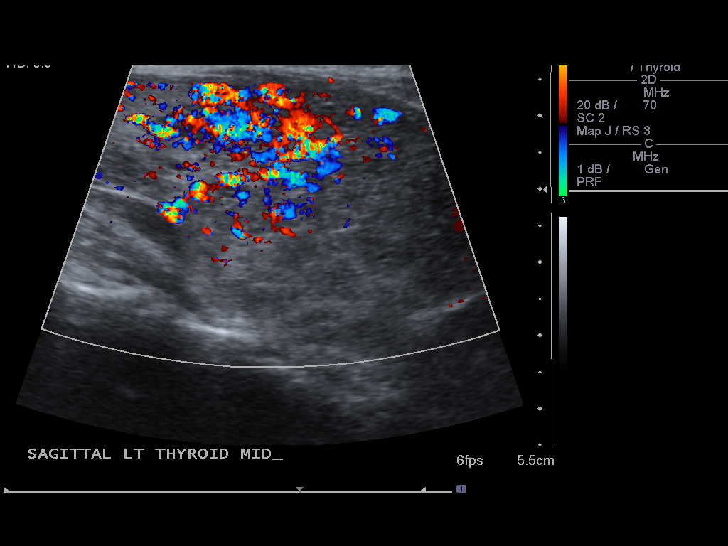
[im 34/46]
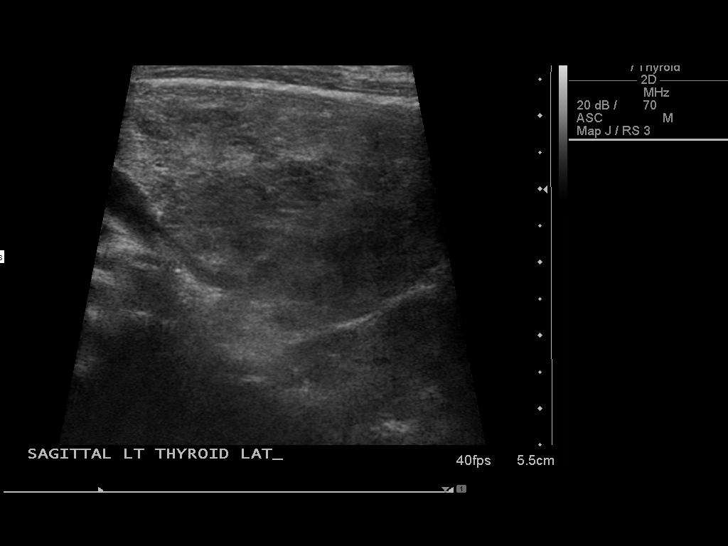
[im 38/46]
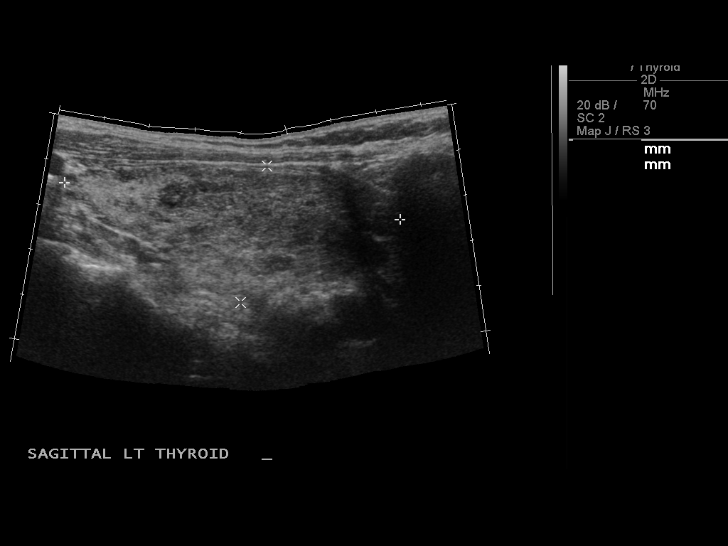
[im 42/46]
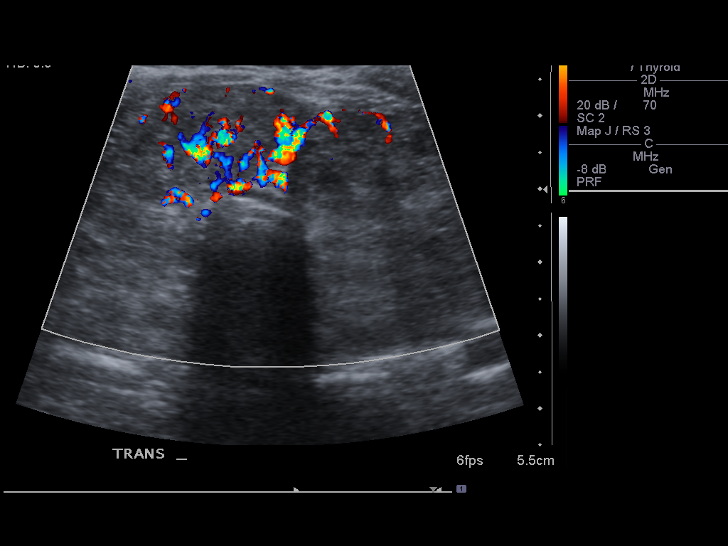
[im 46/46]
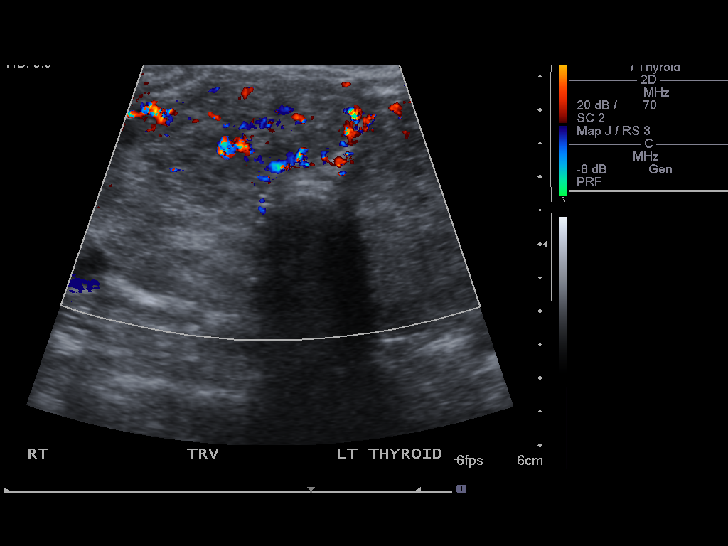

[14 of 25 positions shown; findings below may reference images not displayed]

FINDINGS: The thyroid gland is not diffusely enlarged and
heterogeneous in echotexture.  The right lobe measures 7.0 x 3.1 x
3.0 cm, and the left lobe measures 7.4 x 3.0 x 3.4 cm.  The isthmus
measures 13 mm and AP thickness.  No discrete solid or cystic
nodules are seen bilaterally.
IMPRESSION: Diffusely enlarged, heterogeneous, hypervascular thyroid gland
without focal nodule is most consistent with subacute or chronic
thyroiditis.

## 2013-01-18 ENCOUNTER — Ambulatory Visit (INDEPENDENT_AMBULATORY_CARE_PROVIDER_SITE_OTHER): Payer: Self-pay | Admitting: Internal Medicine

## 2013-01-18 ENCOUNTER — Ambulatory Visit: Payer: Self-pay

## 2013-01-18 ENCOUNTER — Encounter: Payer: Self-pay | Admitting: Internal Medicine

## 2013-01-18 VITALS — BP 120/75 | HR 92 | Temp 98.4°F | Ht 63.0 in | Wt 201.0 lb

## 2013-01-18 DIAGNOSIS — G245 Blepharospasm: Secondary | ICD-10-CM

## 2013-01-18 DIAGNOSIS — E039 Hypothyroidism, unspecified: Secondary | ICD-10-CM

## 2013-01-18 DIAGNOSIS — D649 Anemia, unspecified: Secondary | ICD-10-CM

## 2013-01-18 NOTE — Assessment & Plan Note (Signed)
The lab was closed post 5 PM. - Future order for TSH placed. - Patient will come back tomorrow to get it done along with paperwork for orange card renewal.

## 2013-01-18 NOTE — Assessment & Plan Note (Signed)
Discussed in detail with patient about symptomatic management. - No apparent etiology. - No other associated neurological symptoms. - Advised her to put artificial tears in both eyes for the gritty sensation. - Reevaluate if symptoms get worse.

## 2013-01-18 NOTE — Patient Instructions (Signed)
Please make a followup appointment in 3-4 months or as needed for left eye problem.  - Get the thyroid test done tomorrow or any time this week. - Get the paperwork done for orange card. - Continue taking Synthroid and other medications regularly.

## 2013-01-18 NOTE — Progress Notes (Signed)
  Subjective:    [name], female    DOB: 1986/04/11, 27 y.o.   MRN: [id number]  HPI patient is a pleasant 27 year old woman with hypothyroidism secondary to radioactive iodine therapy for Graves' disease, anemia and other problems as per problem list who comes to the clinic for followup for blepharospasm.  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 She says she still continues to have left eye blinking which is better in morning and worse as the day progresses. It started when she went to [place]. She was drinking too much caffeine at that time, but now she is not drinking excess caffeine. She does describe dryness and gritty feeling in her left eye.  She does not have eye pain, blurry vision, problems with movements, headache, palpitations, dry mouth.  Last TSH was 0.7 in October 2013. She is on Synthroid 100 mcg daily. Her weight is increased from pretreatment values 160 pounds around December 2012 to 201 pounds today.    Review of Systems    as per history of present illness. Objective:   Physical Exam General: NAD HEENT: PERRL, EOMI, no scleral icterus. No significant redness and bilateral eyes. Cardiac: S1, S2, RRR, no rubs, murmurs or gallops Pulm: clear to auscultation bilaterally, moving normal volumes of air Abd: soft, nontender, nondistended, BS present Ext: warm and well perfused, no pedal edema Neuro: alert and oriented X3, cranial nerves II-XII grossly intact        Assessment & Plan:

## 2013-01-18 NOTE — Assessment & Plan Note (Signed)
She stopped taking ferrous sulfate.

## 2013-01-19 ENCOUNTER — Other Ambulatory Visit (INDEPENDENT_AMBULATORY_CARE_PROVIDER_SITE_OTHER): Payer: Self-pay

## 2013-01-19 DIAGNOSIS — E039 Hypothyroidism, unspecified: Secondary | ICD-10-CM

## 2013-01-19 LAB — TSH: TSH: 1.284 u[IU]/mL (ref 0.350–4.500)

## 2013-01-19 NOTE — Progress Notes (Signed)
Case discussed with Dr. Patel immediately after the resident saw the patient.  We reviewed the resident's history and exam and pertinent patient test results.  I agree with the assessment, diagnosis and plan of care documented in the resident's note. 

## 2013-01-25 ENCOUNTER — Ambulatory Visit: Payer: Self-pay

## 2013-01-27 ENCOUNTER — Ambulatory Visit: Payer: Self-pay

## 2013-02-01 ENCOUNTER — Telehealth: Payer: Self-pay | Admitting: *Deleted

## 2013-02-01 NOTE — Telephone Encounter (Signed)
Pt calls and states her "heart is beating really fast", states she feels short of breath and weak. She states this is ongoing for more than a week, states it is constant. She is ask if it is worse today and she states yes. She is advised to go to ED or urg care now via 911, states her friend is coming to pick her up and she will go, she is advised if she feels more chest discomfort or shortness of breath to call 911, she is agreeable

## 2013-02-04 ENCOUNTER — Emergency Department (HOSPITAL_COMMUNITY)
Admission: EM | Admit: 2013-02-04 | Discharge: 2013-02-04 | Disposition: A | Discharge status: Home / Self Care | Payer: Self-pay | Attending: Emergency Medicine | Admitting: Emergency Medicine

## 2013-02-04 ENCOUNTER — Encounter (HOSPITAL_COMMUNITY): Payer: Self-pay | Admitting: Emergency Medicine

## 2013-02-04 DIAGNOSIS — Z8639 Personal history of other endocrine, nutritional and metabolic disease: Secondary | ICD-10-CM | POA: Insufficient documentation

## 2013-02-04 DIAGNOSIS — Z862 Personal history of diseases of the blood and blood-forming organs and certain disorders involving the immune mechanism: Secondary | ICD-10-CM | POA: Insufficient documentation

## 2013-02-04 DIAGNOSIS — Z8679 Personal history of other diseases of the circulatory system: Secondary | ICD-10-CM | POA: Insufficient documentation

## 2013-02-04 DIAGNOSIS — E039 Hypothyroidism, unspecified: Secondary | ICD-10-CM | POA: Insufficient documentation

## 2013-02-04 DIAGNOSIS — R002 Palpitations: Secondary | ICD-10-CM | POA: Insufficient documentation

## 2013-02-04 DIAGNOSIS — R000 Tachycardia, unspecified: Secondary | ICD-10-CM | POA: Insufficient documentation

## 2013-02-04 DIAGNOSIS — Z8669 Personal history of other diseases of the nervous system and sense organs: Secondary | ICD-10-CM | POA: Insufficient documentation

## 2013-02-04 DIAGNOSIS — Z8659 Personal history of other mental and behavioral disorders: Secondary | ICD-10-CM | POA: Insufficient documentation

## 2013-02-04 LAB — CBC
Hemoglobin: 13.6 g/dL (ref 12.0–15.0)
MCH: 27.8 pg (ref 26.0–34.0)
MCV: 80.8 fL (ref 78.0–100.0)
Platelets: 343 10*3/uL (ref 150–400)
RBC: 4.89 MIL/uL (ref 3.87–5.11)
WBC: 4.4 10*3/uL (ref 4.0–10.5)

## 2013-02-04 LAB — BASIC METABOLIC PANEL
CO2: 25 mEq/L (ref 19–32)
Calcium: 9.5 mg/dL (ref 8.4–10.5)
Chloride: 101 mEq/L (ref 96–112)
Glucose, Bld: 110 mg/dL — ABNORMAL HIGH (ref 70–99)
Sodium: 136 mEq/L (ref 135–145)

## 2013-02-04 LAB — TSH: TSH: 0.513 u[IU]/mL (ref 0.350–4.500)

## 2013-02-04 MED ORDER — SODIUM CHLORIDE 0.9 % IV SOLN
Freq: Once | INTRAVENOUS | Status: AC
  Administered 2013-02-04: 12:00:00 via INTRAVENOUS

## 2013-02-04 MED ORDER — ONDANSETRON HCL 4 MG/2ML IJ SOLN: 4.0000 mg | Freq: Once | INTRAMUSCULAR | Status: DC

## 2013-02-04 NOTE — ED Provider Notes (Signed)
History     CSN: [id number]  Arrival date & time 02/04/13  1046   First MD Initiated Contact with Patient 02/04/13 1115      Chief Complaint  Patient presents with  . Tachycardia    (Consider location/radiation/quality/duration/timing/severity/associated sxs/prior treatment) Patient is a 27 y.o. female presenting with palpitations. The history is provided by the patient. No language interpreter was used.  Palpitations Palpitations quality:  Fast Onset quality:  Gradual Duration:  4 weeks Timing:  Constant Progression:  Worsening Chronicity:  New Relieved by:  Nothing Ineffective treatments:  None tried Associated symptoms: no chest pain, no cough and no shortness of breath   Risk factors: no hx of PE    Pt reports she saw her MD 2 weeks ago for the same.  Pt reports heart racing on and off.   Pt reports she has had similar in the past.  Pt is followed by Dr. Allena Katz and endrocrinology Past Medical History  Diagnosis Date  . Thyrotoxicosis   . Atrial tachycardia   . Dizziness   . Hypothyroid   . Depression   . Anxiety     History reviewed. No pertinent past surgical history.  No family history on file.  History  Substance Use Topics  . Smoking status: Never Smoker   . Smokeless tobacco: Never Used  . Alcohol Use: No    OB History   Grav Para Term Preterm Abortions TAB SAB Ect Mult Living   1     0          Review of Systems  Respiratory: Negative for cough and shortness of breath.   Cardiovascular: Positive for palpitations. Negative for chest pain.  All other systems reviewed and are negative.    Allergies  Review of patient's allergies indicates no known allergies.  Home Medications   Current Outpatient Rx  Name  Route  Sig  Dispense  Refill  . ferrous sulfate 325 (65 FE) MG tablet   Oral   Take 1 tablet (325 mg total) by mouth daily with breakfast.   30 tablet   5   . levothyroxine (SYNTHROID, LEVOTHROID) 100 MCG tablet   Oral   Take 1  tablet (100 mcg total) by mouth daily.   30 tablet   11     BP 109/71  Pulse 92  Temp(Src) 97.9 F (36.6 C) (Oral)  Resp 16  SpO2 97%  LMP 01/03/2013  Physical Exam  Nursing note and vitals reviewed. Constitutional: She appears well-developed and well-nourished.  HENT:  Head: Normocephalic.  Right Ear: External ear normal.  Left Ear: External ear normal.  Eyes: EOM are normal. Pupils are equal, round, and reactive to light.  Neck: Normal range of motion. Neck supple.  Cardiovascular: Normal rate and normal heart sounds.   Pulmonary/Chest: Effort normal and breath sounds normal.  Abdominal: Soft.  Musculoskeletal: Normal range of motion.  Neurological: She is alert.  Skin: Skin is warm.  Psychiatric: She has a normal mood and affect.    ED Course  Procedures (including critical care time)  Labs Reviewed  BASIC METABOLIC PANEL - Abnormal; Notable for the following:    Glucose, Bld 110 (*)    All other components within normal limits  CBC  TSH   No results found.   No diagnosis found.    MDM   Results for orders placed during the hospital encounter of 02/04/13  CBC      Result Value Range   WBC 4.4  4.0 -  10.5 K/uL   RBC 4.89  3.87 - 5.11 MIL/uL   Hemoglobin 13.6  12.0 - 15.0 g/dL   HCT 16.1  09.6 - 04.5 %   MCV 80.8  78.0 - 100.0 fL   MCH 27.8  26.0 - 34.0 pg   MCHC 34.4  30.0 - 36.0 g/dL   RDW 40.9  81.1 - 91.4 %   Platelets 343  150 - 400 K/uL  BASIC METABOLIC PANEL      Result Value Range   Sodium 136  135 - 145 mEq/L   Potassium 4.1  3.5 - 5.1 mEq/L   Chloride 101  96 - 112 mEq/L   CO2 25  19 - 32 mEq/L   Glucose, Bld 110 (*) 70 - 99 mg/dL   BUN 10  6 - 23 mg/dL   Creatinine, Ser 7.82  0.50 - 1.10 mg/dL   Calcium 9.5  8.4 - 95.6 mg/dL   GFR calc non Af Amer >90  >90 mL/min   GFR calc Af Amer >90  >90 mL/min   No results found.        Lonia Skinner Greenwald, PA-C 02/04/13 1413

## 2013-02-04 NOTE — ED Provider Notes (Signed)
Medical screening examination/treatment/procedure(s) were performed by non-physician practitioner and as supervising physician I was immediately available for consultation/collaboration.  Lyanne Co, MD 02/04/13 1415

## 2013-02-04 NOTE — ED Notes (Signed)
Has felt like her heart racing x 1 month is on synthroid and states she told her dr but he just blood work

## 2013-02-04 NOTE — ED Provider Notes (Signed)
Date: 02/04/2013  Rate: 109  Rhythm: sinus tachycardia QRS Axis: normal  Intervals: normal  ST/T Wave abnormalities: normal  Conduction Disutrbances: none  Narrative Interpretation:   Old EKG Reviewed: No significant changes noted     Lyanne Co, MD 02/04/13 1237

## 2013-02-09 ENCOUNTER — Telehealth: Payer: Self-pay | Admitting: *Deleted

## 2013-02-09 NOTE — Telephone Encounter (Signed)
Left msg on triage requesting call back. Called pt back wanted to see if we are Internal medicine office. Inform pt that we are. Called drop so i call pt back she did not answer...Raechel Chute

## 2013-02-15 ENCOUNTER — Ambulatory Visit: Payer: Self-pay | Admitting: Cardiology

## 2013-02-15 ENCOUNTER — Ambulatory Visit: Payer: Self-pay

## 2013-03-21 ENCOUNTER — Ambulatory Visit: Payer: Self-pay | Admitting: Cardiovascular Disease

## 2013-03-29 ENCOUNTER — Ambulatory Visit (INDEPENDENT_AMBULATORY_CARE_PROVIDER_SITE_OTHER): Payer: Self-pay | Admitting: Internal Medicine

## 2013-03-29 ENCOUNTER — Encounter: Payer: Self-pay | Admitting: Internal Medicine

## 2013-03-29 VITALS — BP 116/77 | HR 50 | Temp 98.1°F | Ht 63.0 in | Wt 207.3 lb

## 2013-03-29 DIAGNOSIS — N730 Acute parametritis and pelvic cellulitis: Secondary | ICD-10-CM

## 2013-03-29 MED ORDER — AZITHROMYCIN 500 MG PO TABS: ORAL_TABLET | ORAL | Status: DC

## 2013-03-29 MED ORDER — MECLIZINE HCL 12.5 MG PO TABS: 12.5000 mg | ORAL_TABLET | Freq: Two times a day (BID) | ORAL | Status: DC

## 2013-03-29 MED ORDER — CEFTRIAXONE SODIUM 1 G IJ SOLR
1.0000 g | Freq: Once | INTRAMUSCULAR | Status: AC
  Administered 2013-03-29: 1 g via INTRAMUSCULAR

## 2013-03-29 MED ORDER — CEFTRIAXONE SODIUM 1 G IJ SOLR: 1.0000 g | Freq: Once | INTRAMUSCULAR | Status: DC

## 2013-03-29 NOTE — Progress Notes (Signed)
Subjective:    [name], female    DOB: Dec 12, 1985, 27 y.o.   MRN: [id number]  Abdominal Pain  Back Pain Associated symptoms include abdominal pain.   Presenting complaints- Back pain and abdominal pain- 4 days   Vaginal discharge- 1 day.  13 y o B F with hx of hyperthyroidism and radiation, curently on Synthroid, and hx of mennieres dx.  Presented with a 4 day hx of abdominal pain, in the Lt lower quad, describes it as a 3 out of 10 intensity, intermitent, sharp and crampy  non radiating, no known aggrav or relieving factors. No assoc n, v or diarrhoea. No dysuria. No fever. There is assoc vaginal dsg which she said she just noticed here in the hospital when she used the rest room, describes it as distinctly diff from her normal vaginal discharge, clear but slimy,much larger quantity,not foul smelling. No assoc vaginal itching. No bleeding, sometimes uses condoms, no pain during coitus, prior hx of treatment for bacteria vaginosis. LMP- 6 days ago- not as heavy as previous periods, normal has heavy flows, with clots, uses 2 pads at once, up to 8 in a 24 hr period, last 5-7 days with dysmenorrhea,Hasd an USS done 1 yr ago, was told she has a small fibroid,Was given OCPs, but she didn't take them, because she was afraid of weight gain. Sexually active- says with one sexual partner, he hasnt had any symptoms of STDs. Reports constipation for the past 5 days- though she takes iron tablets, as she was told she had anaemia due to her heavy periods.  Back pain started 4 days ago also, 5 out of 10 severity, non radiating, no relieving or aggrav factors. No weakness or abn sensations of her legs, no previous hx or family hx, no hx of rash.   Review of Systems  Gastrointestinal: Positive for abdominal pain.  Musculoskeletal: Positive for back pain.   ROS- complaints of dizziness for the past 3 yrs, got worse 1 week ago, assoc tinnitus in the Lt ear, mild hearing loss in Lt ear, with  fullness in both ears, and headache of one week. - Symptoms of anxiety- palpitations, diaphoresis, SOB,sense of sth bad going to happen, increase urine frequency, has about an episode a day, several a week. Not signif interfering with her daily activities. -No chest pain, no cough. -No seizures, neck pain or stiffness.    Objective:   Physical Exam Abd- soft, moves with respiration, mild tenderness in LLQ, no rebound tenderness or guarding, bowel sounds present. Pelvic- no ulcers or perianal lesions, vulva appears normal. Cervical motion tenderness on the Lt. No adnexal masses palpable as patient, ovaries hard to palpate because of patient physic. Speculum exam- showed cervical discharge, normal appearing cervix. Back- tenderness to palpation. No curvature.  Cardiac- RRR, no murmurs, rubs or gallops.  Chest- moving normal vols of air, clear to auscultation. Head- atraumatic, normocephelic. Eyes- PERRL, EOMI.Marland Kitchen  Extremities- 2+ pulses bilat.      Assessment & Plan:   Pelvic Inflammatory Dx- Presence of abd pain, with vaginal discharge, high risk sexual behaviour, and cervical motion tenderness supports this diagnosis. Other likely aetiology- Ovarian Cyst, and presence of fibroid on previous USS, the fibroids could have increased in size, could cause pelvic pain and heavy periods as seen in this patient especially if they are submucosal.   -Cervical Chlamydia and Gonoccocal antigen. - Cervical prep- Wet mount for bacteria vaginosis or trichomononiasis. - M ceftriaxone 1g, one dose. -Oral Azithromycin- 1g. One  dose. - HIV test to R/o other STDs considering patients risk, and suspected STD. -patient has an appointment with her gynaecologist next month. 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 -Meclizine- 12.5 mg BID.

## 2013-03-30 LAB — HIV ANTIBODY (ROUTINE TESTING W REFLEX): HIV: NONREACTIVE

## 2013-03-31 ENCOUNTER — Telehealth: Payer: Self-pay | Admitting: *Deleted

## 2013-03-31 ENCOUNTER — Encounter (HOSPITAL_COMMUNITY): Payer: Self-pay | Admitting: Family Medicine

## 2013-03-31 ENCOUNTER — Emergency Department (HOSPITAL_COMMUNITY)
Admission: EM | Admit: 2013-03-31 | Discharge: 2013-03-31 | Disposition: A | Discharge status: Home / Self Care | Payer: Medicaid Other | Attending: Emergency Medicine | Admitting: Emergency Medicine

## 2013-03-31 DIAGNOSIS — Z8659 Personal history of other mental and behavioral disorders: Secondary | ICD-10-CM | POA: Insufficient documentation

## 2013-03-31 DIAGNOSIS — Z3202 Encounter for pregnancy test, result negative: Secondary | ICD-10-CM | POA: Insufficient documentation

## 2013-03-31 DIAGNOSIS — E039 Hypothyroidism, unspecified: Secondary | ICD-10-CM | POA: Insufficient documentation

## 2013-03-31 DIAGNOSIS — E0590 Thyrotoxicosis, unspecified without thyrotoxic crisis or storm: Secondary | ICD-10-CM | POA: Insufficient documentation

## 2013-03-31 DIAGNOSIS — R269 Unspecified abnormalities of gait and mobility: Secondary | ICD-10-CM | POA: Insufficient documentation

## 2013-03-31 DIAGNOSIS — Z8679 Personal history of other diseases of the circulatory system: Secondary | ICD-10-CM | POA: Insufficient documentation

## 2013-03-31 DIAGNOSIS — R519 Headache, unspecified: Secondary | ICD-10-CM

## 2013-03-31 DIAGNOSIS — R51 Headache: Secondary | ICD-10-CM | POA: Insufficient documentation

## 2013-03-31 HISTORY — DX: Meniere's disease, unspecified ear: H81.09

## 2013-03-31 LAB — POCT PREGNANCY, URINE: Preg Test, Ur: NEGATIVE

## 2013-03-31 MED ORDER — METOCLOPRAMIDE HCL 5 MG/ML IJ SOLN
10.0000 mg | Freq: Once | INTRAMUSCULAR | Status: AC
  Administered 2013-03-31: 10 mg via INTRAVENOUS
  Filled 2013-03-31: qty 2

## 2013-03-31 MED ORDER — SODIUM CHLORIDE 0.9 % IV BOLUS (SEPSIS)
1000.0000 mL | Freq: Once | INTRAVENOUS | Status: AC
  Administered 2013-03-31: 1000 mL via INTRAVENOUS

## 2013-03-31 MED ORDER — KETOROLAC TROMETHAMINE 30 MG/ML IJ SOLN
30.0000 mg | Freq: Once | INTRAMUSCULAR | Status: AC
  Administered 2013-03-31: 30 mg via INTRAVENOUS
  Filled 2013-03-31: qty 1

## 2013-03-31 NOTE — ED Provider Notes (Signed)
CSN: [id number]     Arrival date & time 03/31/13  1607 History     First MD Initiated Contact with Patient 03/31/13 1820     Chief Complaint  Patient presents with  . Headache   (Consider location/radiation/quality/duration/timing/severity/associated sxs/prior Treatment) Patient is a 27 y.o. female presenting with headaches.  Headache  Pt reports gradual onset of L sided headache several hours ago, initially associated with blurry vision in L eye but that has resolved. This is not the worst headache of her life. Associated with nausea but no photophobia. Recently had some vertigo symptoms and diagnosed with Meniere's disease at an UC but was not having HA then.  Past Medical History  Diagnosis Date  . Thyrotoxicosis   . Atrial tachycardia   . Dizziness   . Hypothyroid   . Depression   . Anxiety    History reviewed. No pertinent past surgical history. History reviewed. No pertinent family history. History  Substance Use Topics  . Smoking status: Never Smoker   . Smokeless tobacco: Never Used  . Alcohol Use: No   OB History   Grav Para Term Preterm Abortions TAB SAB Ect Mult Living   1     0         Review of Systems  Neurological: Positive for headaches.   All other systems reviewed and are negative except as noted in HPI.   Allergies  Review of patient's allergies indicates no known allergies.  Home Medications   Current Outpatient Rx  Name  Route  Sig  Dispense  Refill  . ferrous sulfate 325 (65 FE) MG tablet   Oral   Take 1 tablet (325 mg total) by mouth daily with breakfast.   30 tablet   5   . levothyroxine (SYNTHROID, LEVOTHROID) 100 MCG tablet   Oral   Take 1 tablet (100 mcg total) by mouth daily.   30 tablet   11   . meclizine (ANTIVERT) 12.5 MG tablet   Oral   Take 1 tablet (12.5 mg total) by mouth 2 (two) times daily.   30 tablet   1   . Multiple Vitamin (MULTIVITAMIN WITH MINERALS) TABS   Oral   Take 1 tablet by mouth daily.           BP 118/61  Pulse 110  Temp(Src) 98.3 F (36.8 C)  Resp 18  SpO2 98%  LMP 03/17/2013 Physical Exam  Nursing note and vitals reviewed. Constitutional: She is oriented to person, place, and time. She appears well-developed and well-nourished.  HENT:  Head: Normocephalic and atraumatic.  Eyes: EOM are normal. Pupils are equal, round, and reactive to light.  Neck: Normal range of motion. Neck supple.  Cardiovascular: Normal rate, normal heart sounds and intact distal pulses.   Pulmonary/Chest: Effort normal and breath sounds normal.  Abdominal: Bowel sounds are normal. She exhibits no distension. There is no tenderness.  Musculoskeletal: Normal range of motion. She exhibits no edema and no tenderness.  Neurological: She is alert and oriented to person, place, and time. She has normal strength. No cranial nerve deficit or sensory deficit. Gait abnormal.  Skin: Skin is warm and dry. No rash noted.  Psychiatric: She has a normal mood and affect.    ED Course   Procedures (including critical care time)  Labs Reviewed  POCT PREGNANCY, URINE   No results found. 1. Headache     MDM  Pt feeling better, headache resolved, sleeping comfortably and ready to go home. No concern for  acute intracranial process. Advised PCP followup.   Charles B. Bernette Mayers, MD 03/31/13 2102

## 2013-03-31 NOTE — ED Notes (Signed)
Per pt HA that started today associated with light sensitivity and blurry vision and dizziness. sts recently prescribed Antivert

## 2013-03-31 NOTE — ED Notes (Addendum)
Pt alert and oriented, with steady gait at time of discharge. Pt given discharge papers and papers explained. All questions answered and pt walked to discharge. Pt's iv d/c'd

## 2013-03-31 NOTE — Telephone Encounter (Signed)
Pt called clinic stating she was seen in clinic on 7/22 and started on new med. ( Meclizine & Zithromax ) A few minutes ago she started feeing weird, having blurred vision and being dizzy. She wanted to let us know she was on her way to the ED.  Mother is driving her.

## 2013-03-31 NOTE — Telephone Encounter (Signed)
Thank you :)

## 2013-04-06 ENCOUNTER — Ambulatory Visit: Payer: Self-pay

## 2013-04-06 ENCOUNTER — Institutional Professional Consult (permissible substitution): Payer: Self-pay | Admitting: Cardiovascular Disease

## 2013-04-06 ENCOUNTER — Encounter: Payer: Self-pay | Admitting: Internal Medicine

## 2013-04-06 ENCOUNTER — Ambulatory Visit: Payer: Medicaid Other | Admitting: Internal Medicine

## 2013-04-12 ENCOUNTER — Telehealth: Payer: Self-pay | Admitting: Internal Medicine

## 2013-04-15 ENCOUNTER — Telehealth: Payer: Self-pay | Admitting: *Deleted

## 2013-04-15 ENCOUNTER — Other Ambulatory Visit: Payer: Self-pay | Admitting: Internal Medicine

## 2013-04-15 DIAGNOSIS — H8102 Meniere's disease, left ear: Secondary | ICD-10-CM

## 2013-04-15 NOTE — Telephone Encounter (Signed)
Pt is asking for referral to ENT for meniere's disease to DR Keturah Barre # [phone] She has appointment scheduled for 8/20 but needs our referral. Referral was done in 2013 @ [facility]. Pt seen once but now she has insurance and wants to see doctor in [place].

## 2013-04-19 ENCOUNTER — Encounter: Payer: Self-pay | Admitting: Cardiovascular Disease

## 2013-04-21 NOTE — Telephone Encounter (Signed)
Mamie to complete

## 2013-05-02 ENCOUNTER — Ambulatory Visit: Payer: Self-pay | Admitting: Obstetrics & Gynecology

## 2013-05-06 ENCOUNTER — Encounter: Payer: No Typology Code available for payment source | Admitting: Internal Medicine

## 2013-05-06 ENCOUNTER — Encounter: Payer: Self-pay | Admitting: Internal Medicine

## 2013-05-12 ENCOUNTER — Emergency Department (HOSPITAL_COMMUNITY)
Admission: EM | Admit: 2013-05-12 | Discharge: 2013-05-12 | Disposition: A | Discharge status: Home / Self Care | Payer: No Typology Code available for payment source | Attending: Emergency Medicine | Admitting: Emergency Medicine

## 2013-05-12 ENCOUNTER — Encounter (HOSPITAL_COMMUNITY): Payer: Self-pay | Admitting: *Deleted

## 2013-05-12 DIAGNOSIS — Z8639 Personal history of other endocrine, nutritional and metabolic disease: Secondary | ICD-10-CM | POA: Insufficient documentation

## 2013-05-12 DIAGNOSIS — Z862 Personal history of diseases of the blood and blood-forming organs and certain disorders involving the immune mechanism: Secondary | ICD-10-CM | POA: Insufficient documentation

## 2013-05-12 DIAGNOSIS — Z79899 Other long term (current) drug therapy: Secondary | ICD-10-CM | POA: Insufficient documentation

## 2013-05-12 DIAGNOSIS — Z3202 Encounter for pregnancy test, result negative: Secondary | ICD-10-CM | POA: Insufficient documentation

## 2013-05-12 DIAGNOSIS — E039 Hypothyroidism, unspecified: Secondary | ICD-10-CM | POA: Insufficient documentation

## 2013-05-12 DIAGNOSIS — Z8669 Personal history of other diseases of the nervous system and sense organs: Secondary | ICD-10-CM | POA: Insufficient documentation

## 2013-05-12 DIAGNOSIS — R000 Tachycardia, unspecified: Secondary | ICD-10-CM | POA: Insufficient documentation

## 2013-05-12 DIAGNOSIS — R1110 Vomiting, unspecified: Secondary | ICD-10-CM

## 2013-05-12 DIAGNOSIS — Z8659 Personal history of other mental and behavioral disorders: Secondary | ICD-10-CM | POA: Insufficient documentation

## 2013-05-12 LAB — CBC WITH DIFFERENTIAL/PLATELET
Basophils Relative: 1 % (ref 0–1)
Hemoglobin: 12.7 g/dL (ref 12.0–15.0)
Lymphs Abs: 1.2 10*3/uL (ref 0.7–4.0)
MCHC: 33.3 g/dL (ref 30.0–36.0)
Monocytes Relative: 11 % (ref 3–12)
Neutro Abs: 3 10*3/uL (ref 1.7–7.7)
Neutrophils Relative %: 62 % (ref 43–77)
RBC: 4.8 MIL/uL (ref 3.87–5.11)
WBC: 4.9 10*3/uL (ref 4.0–10.5)

## 2013-05-12 LAB — RPR: RPR Ser Ql: NONREACTIVE

## 2013-05-12 LAB — URINALYSIS, ROUTINE W REFLEX MICROSCOPIC
Bilirubin Urine: NEGATIVE
Glucose, UA: NEGATIVE mg/dL
Hgb urine dipstick: NEGATIVE
Ketones, ur: NEGATIVE mg/dL
Leukocytes, UA: NEGATIVE
Nitrite: NEGATIVE
Protein, ur: NEGATIVE mg/dL
Specific Gravity, Urine: 1.005 (ref 1.005–1.030)
Urobilinogen, UA: 0.2 mg/dL (ref 0.0–1.0)
pH: 7 (ref 5.0–8.0)

## 2013-05-12 LAB — LACTIC ACID, PLASMA: Lactic Acid, Venous: 1.2 mmol/L (ref 0.5–2.2)

## 2013-05-12 LAB — BASIC METABOLIC PANEL
BUN: 8 mg/dL (ref 6–23)
Chloride: 95 mEq/L — ABNORMAL LOW (ref 96–112)
GFR calc Af Amer: >90 mL/min (ref >90)
Potassium: 2.8 mEq/L — ABNORMAL LOW (ref 3.5–5.1)

## 2013-05-12 LAB — WET PREP, GENITAL: Clue Cells Wet Prep HPF POC: NONE SEEN

## 2013-05-12 MED ORDER — SODIUM CHLORIDE 0.9 % IV BOLUS (SEPSIS)
500.0000 mL | Freq: Once | INTRAVENOUS | Status: AC
  Administered 2013-05-12: 500 mL via INTRAVENOUS

## 2013-05-12 MED ORDER — ONDANSETRON HCL 4 MG PO TABS: 4.0000 mg | ORAL_TABLET | Freq: Three times a day (TID) | ORAL | Status: DC | PRN

## 2013-05-12 MED ORDER — POTASSIUM CHLORIDE CRYS ER 20 MEQ PO TBCR
40.0000 meq | EXTENDED_RELEASE_TABLET | Freq: Once | ORAL | Status: AC
  Administered 2013-05-12: 40 meq via ORAL
  Filled 2013-05-12: qty 2

## 2013-05-12 NOTE — ED Provider Notes (Signed)
I saw and evaluated the patient, reviewed the resident's note and I agree with the findings and plan. If applicable, I agree with the resident's interpretation of the EKG.  If applicable, I was present for critical portions of any procedures performed.  Intermittent nausea and vomiting for the past 5 dasy.  No fever or abdominal pain. Abdomen soft, no peritoneal signs.  Glynn Octave, MD 05/12/13 [phone]

## 2013-05-12 NOTE — ED Notes (Signed)
Pt c/o emesis x 1 week.  Denies abd pain, diarrhea or constipation.  Pt states she started taking hctz and potassium 2 weeks ago and she thinks it is related.

## 2013-05-12 NOTE — ED Provider Notes (Signed)
CSN: [id number]     Arrival date & time 05/12/13  4098 History   First MD Initiated Contact with Patient 05/12/13 [phone]     Chief Complaint  Patient presents with  . Emesis   (Consider location/radiation/quality/duration/timing/severity/associated sxs/prior Treatment) HPI Comments: 48 y F, currently menstruating (started 2 days ago) although this cycle was early, here for vomiting x 1 week.  NB/NB/no coffee ground.  No diarrhea, fevers, abd pain.  She reports being treated for PID in June.  Her partner was not treated and she has continued to be sexually active with no protection with her one female partner.  She started HCTZ and potassium tabs 2 weeks ago.  Patient is a 27 y.o. female presenting with vomiting. The history is provided by the patient.  Emesis Severity:  Mild Timing:  Intermittent Number of daily episodes:  3 Quality:  Stomach contents Able to tolerate:  Liquids and solids Progression:  Unchanged Chronicity:  New Recent urination:  Normal Ineffective treatments:  None tried Associated symptoms: no abdominal pain, no chills, no cough, no diarrhea, no fever, no headaches, no myalgias and no URI     Past Medical History  Diagnosis Date  . Thyrotoxicosis   . Atrial tachycardia   . Dizziness   . Hypothyroid   . Depression   . Anxiety   . Meniere's disease    History reviewed. No pertinent past surgical history. No family history on file. History  Substance Use Topics  . Smoking status: Never Smoker   . Smokeless tobacco: Never Used  . Alcohol Use: No   OB History   Grav Para Term Preterm Abortions TAB SAB Ect Mult Living   1     0         Review of Systems  Constitutional: Negative for fever and chills.  HENT: Negative for neck pain and neck stiffness.   Respiratory: Negative for chest tightness and shortness of breath.   Cardiovascular: Negative for chest pain and palpitations.  Gastrointestinal: Positive for vomiting. Negative for abdominal pain and  diarrhea.  Genitourinary: Negative for dysuria, frequency, vaginal bleeding and vaginal discharge.  Musculoskeletal: Negative for myalgias and back pain.  Skin: Negative for pallor and rash.  Neurological: Negative for weakness and headaches.  Hematological: Negative for adenopathy. Does not bruise/bleed easily.  All other systems reviewed and are negative.    Allergies  Review of patient's allergies indicates no known allergies.  Home Medications   Current Outpatient Rx  Name  Route  Sig  Dispense  Refill  . chlorthalidone (HYGROTON) 25 MG tablet   Oral   Take 25 mg by mouth daily.         Marland Kitchen levothyroxine (SYNTHROID, LEVOTHROID) 100 MCG tablet   Oral   Take 100 mcg by mouth daily.         . Multiple Vitamin (MULTIVITAMIN WITH MINERALS) TABS   Oral   Take 1 tablet by mouth daily.          BP 137/100  Pulse 110  Temp(Src) 99.3 F (37.4 C) (Oral)  Resp 18  SpO2 100%  LMP 05/10/2013 Physical Exam  Vitals reviewed. Constitutional: She is oriented to person, place, and time. She appears well-developed and well-nourished. No distress.  HENT:  Right Ear: External ear normal.  Left Ear: External ear normal.  Mouth/Throat: No oropharyngeal exudate.  Eyes: Conjunctivae and EOM are normal. Pupils are equal, round, and reactive to light.  Neck: Normal range of motion. Neck supple.  Cardiovascular: Regular  rhythm, normal heart sounds and intact distal pulses.  Tachycardia present.  Exam reveals no gallop and no friction rub.   No murmur heard. Pulmonary/Chest: Effort normal and breath sounds normal.  Abdominal: Soft. Bowel sounds are normal. She exhibits no distension and no mass. There is tenderness (mild, only in suprapubic area). There is no rebound and no guarding.  Genitourinary: There is no rash or tenderness on the right labia. There is no rash or tenderness on the left labia. Uterus is not tender. Cervix exhibits no motion tenderness and no discharge. Right adnexum  displays no mass and no tenderness. Left adnexum displays no mass and no tenderness. There is bleeding around the vagina.  Musculoskeletal: Normal range of motion. She exhibits no edema.  Lymphadenopathy:       Right: No inguinal adenopathy present.       Left: No inguinal adenopathy present.  Neurological: She is alert and oriented to person, place, and time. She has normal strength.  Skin: Skin is warm and dry. No rash noted. She is not diaphoretic.  Psychiatric: She has a normal mood and affect.    ED Course  Procedures (including critical care time) Labs Review Labs Reviewed  BASIC METABOLIC PANEL - Abnormal; Notable for the following:    Potassium 2.8 (*)    Chloride 95 (*)    Glucose, Bld 109 (*)    All other components within normal limits  WET PREP, GENITAL  GC/CHLAMYDIA PROBE AMP  URINALYSIS, ROUTINE W REFLEX MICROSCOPIC  CBC WITH DIFFERENTIAL  LACTIC ACID, PLASMA  RPR  POCT PREGNANCY, URINE   Imaging Review No results found.  MDM   37 y F here with vomiting.  Mild tachycardia.  Abd exam soft, but with mild lower abdominal TTP.    Diff Dx: PID, pregnancy, UTI, TOA, gastritis, med rxn.  Doubt appendicitis given lack of reported abd pain, relatively benign abd exam and ability to tolerate PO.  Doubt RUQ pathology as she has no TTP there.   Will perform a pelvic.  10:48 AM Pelvic exam with blood, otherwise normal.  12:14 PM Pt feels better.  Repeat abd exam benign - soft, nontender, no rebound/guarding.  She was counseled to double her potassium from 20 to 40 daily for the next three days and to f/u with her PCP for further instruction.  It is felt the pt is stable for discharge with PCP f/u.  Return precautions reviewed.  Rx for Zofran.   Best contact number: [phone] cell phone, ok to leave messages  Clinical Impression: 1. Vomiting     Disposition: Discharge  Condition: Good  I have discussed the results, Dx and Tx plan. They expressed understanding and  agree with the plan and were told to return to ED with any worsening of condition or concern.    New Prescriptions   ONDANSETRON (ZOFRAN) 4 MG TABLET    Take 1 tablet (4 mg total) by mouth every 8 (eight) hours as needed for nausea.    Follow Up: Rocco Serene, MD [address] [place] [address] [phone]  Schedule an appointment as soon as possible for a visit    Pt seen in conjunction with Dr. Manus Gunning.  Reine Just. Beverely Pace, MD Emergency Medicine PGY-III [phone]    Oleh Genin, MD 05/12/13 [phone]

## 2013-05-13 LAB — GC/CHLAMYDIA PROBE AMP: GC Probe RNA: NEGATIVE

## 2013-05-16 ENCOUNTER — Ambulatory Visit: Payer: No Typology Code available for payment source | Admitting: Internal Medicine

## 2013-05-17 ENCOUNTER — Encounter: Payer: Self-pay | Admitting: Internal Medicine

## 2013-05-18 ENCOUNTER — Telehealth: Payer: Self-pay | Admitting: Internal Medicine

## 2013-05-18 NOTE — Telephone Encounter (Signed)
Called Dr. Allayne Stack office.  Patient kept appointment on 04/27/2013 and notes will be faxed to our office.

## 2013-05-20 ENCOUNTER — Ambulatory Visit: Payer: Self-pay | Admitting: Cardiovascular Disease

## 2013-05-24 ENCOUNTER — Ambulatory Visit: Payer: No Typology Code available for payment source | Admitting: Audiology

## 2013-05-27 ENCOUNTER — Ambulatory Visit (INDEPENDENT_AMBULATORY_CARE_PROVIDER_SITE_OTHER): Payer: No Typology Code available for payment source | Admitting: Internal Medicine

## 2013-05-27 ENCOUNTER — Ambulatory Visit: Payer: No Typology Code available for payment source | Admitting: Audiology

## 2013-05-27 ENCOUNTER — Encounter: Payer: Self-pay | Admitting: Internal Medicine

## 2013-05-27 VITALS — BP 120/82 | HR 99 | Temp 98.4°F | Ht 63.0 in | Wt 210.1 lb

## 2013-05-27 DIAGNOSIS — E876 Hypokalemia: Secondary | ICD-10-CM

## 2013-05-27 DIAGNOSIS — R1110 Vomiting, unspecified: Secondary | ICD-10-CM

## 2013-05-27 DIAGNOSIS — Z0000 Encounter for general adult medical examination without abnormal findings: Secondary | ICD-10-CM

## 2013-05-27 DIAGNOSIS — E039 Hypothyroidism, unspecified: Secondary | ICD-10-CM

## 2013-05-27 LAB — BASIC METABOLIC PANEL WITH GFR
BUN: 10 mg/dL (ref 6–23)
CO2: 28 mEq/L (ref 19–32)
Chloride: 97 mEq/L (ref 96–112)
Glucose, Bld: 79 mg/dL (ref 70–99)
Potassium: 3.5 mEq/L (ref 3.5–5.3)

## 2013-05-27 NOTE — Patient Instructions (Signed)
1. Please do not miss your appointment with your ENT doctor, Dr. Haroldine Laws.   2. Please take all medications as prescribed.  3. If you have worsening of your symptoms or new symptoms arise, please call the clinic ([phone]), or go to the ER immediately if symptoms are severe.  You have done great job in taking all your medications. I appreciate it very much. Please continue doing that.

## 2013-05-27 NOTE — Assessment & Plan Note (Signed)
Patient's recent symptoms of emesis was most likely caused by her Mnire's disease. The other possibility was ruled out by negative tests. Currently patient is doing good. She does not have nausea, vomiting. She still has some residual symptoms for Mnire's disease. She will have a followup appointment with her ENT doctor., Dr. Haroldine Laws in next week. Today BMP is normal.  -We continue diuretics chlorthalidone 25 mg daily, with potassium supplement, sodium chloride 40 mEq daily.

## 2013-05-27 NOTE — Assessment & Plan Note (Signed)
Patient is currently taking Synthroid, 100 mg daily. Her recent TSH was normal 0.513 on 02/04/13. Will continue current regimen.

## 2013-05-27 NOTE — Progress Notes (Signed)
[name], female   DOB: [birth year], 27 y.o.   MRN: [id number]  Subjective:   [name]   DOB: 06/19/1986 26 y.o.   MRN: [id number]  CC:   ED follow up visit.  HPI:  Alyssa Hall is a 27 y.o. lady with past medical history as outlined below, who presents for a followup visit today  Patient visited ED on 05/12/13 because of emesis. Patient was tested for pregnancy, UTI, sexually-transmitted disease, which were all negative. The negative tests include pregnancy test, RPR, GC/chlamydia, wet prep for trichomonas/yeast/clue cell, HIV antibody, urinalysis and CBC. Patient was treated symptomatically in ED and discharged home. Her vomiting resolved spontaneously. Of note patient was diagnosed with Mnire's disease by ENT, Dr. Haroldine Hall on 8/21. She was given prescription of chlorthalidone 25 mg daily. She was found to have low potassium in ED, potassium was 2.8. She was instructed to take 40 mEq of potassium daily. Today she reports that she still has mildly year ringing and dizziness, but they have improved significantly than before.  Patient does not have nausea, vomiting or abdominal pain. She does not have weakness, numbness or decreased sensations in her extremities. She has an appointment with Dr. Esmeralda Hall in next week. Stat BMP is normal today.  ROS:  Denies fever, chills, fatigue, headaches,  cough, chest pain, SOB, abdominal pain, diarrhea, constipation, dysuria, urgency, frequency, hematuria, joint pain or leg swelling.  Past Medical History  Diagnosis Date  . Thyrotoxicosis   . Atrial tachycardia   . Dizziness   . Hypothyroid   . Depression   . Anxiety   . Meniere's disease    Current Outpatient Prescriptions  Medication Sig Dispense Refill  . chlorthalidone (HYGROTON) 25 MG tablet Take 25 mg by mouth daily.      Marland Kitchen levothyroxine (SYNTHROID, LEVOTHROID) 100 MCG tablet Take 100 mcg by mouth daily.      . Multiple Vitamin (MULTIVITAMIN WITH  MINERALS) TABS Take 1 tablet by mouth daily.      . ondansetron (ZOFRAN) 4 MG tablet Take 1 tablet (4 mg total) by mouth every 8 (eight) hours as needed for nausea.  10 tablet  0  . potassium chloride (K-DUR) 10 MEQ tablet Take 40 mEq by mouth daily.      . [DISCONTINUED] atenolol (TENORMIN) 100 MG tablet Take 100 mg by mouth 2 (two) times daily as needed. For  tremors, palpitations, anxiety.       No current facility-administered medications for this visit.   No family history on file. History   Social History  . Marital Status: Single    Spouse Name: N/A    Number of Children: N/A  . Years of Education: N/A   Social History Main Topics  . Smoking status: Never Smoker   . Smokeless tobacco: Never Used  . Alcohol Use: No  . Drug Use: No  . Sexual Activity: Not Currently    Birth Control/ Protection: None   Other Topics Concern  . None   Social History Narrative   Uninsured.   Lives in [place].          Review of Systems: Full 14-point review of systems otherwise negative. See HPI.   Objective:  Physical Exam: Filed Vitals:   05/27/13 1418  BP: 120/82  Pulse: 99  Temp: 98.4 F (36.9 C)  TempSrc: Oral  Height: 5\' 3"  (1.6 m)  Weight: 210 lb 1.6 oz (95.301 kg)  SpO2: 100%   Constitutional: Vital signs reviewed.  Patient is a well-developed and well-nourished, in no acute distress and cooperative with exam.   HEENT:  Head: Normocephalic and atraumatic Mouth: no erythema or exudates, MMM Eyes: PERRL, EOMI, conjunctivae normal, No scleral icterus.  Neck: Supple, Trachea midline normal ROM, No JVD  Cardiovascular: RRR, S1 normal, S2 normal, no MRG, pulses symmetric and intact bilaterally Pulmonary/Chest: CTAB, no wheezes, rales, or rhonchi Abdominal: Soft. Non-tender, non-distended, bowel sounds are normal, no masses, organomegaly, or guarding present.  GU: no CVA tenderness Musculoskeletal: No joint deformities, erythema, or stiffness, ROM full and  non-tender Extremities: No leg edema Hematology: no cervical, inginal, or axillary adenopathy.  Neurological: A&O x3, Strength is normal and symmetric bilaterally, cranial nerve II-XII are grossly intact, no focal motor deficit, sensory intact to light touch bilaterally.  Skin: Warm, dry and intact. No rash, cyanosis, or clubbing.  Psychiatric: Normal mood and affect. No suicidal or homicidal ideation.  Assessment & Plan:

## 2013-05-27 NOTE — Assessment & Plan Note (Signed)
-  Patient refused flu shot today. Will postpone.  

## 2013-06-03 ENCOUNTER — Ambulatory Visit: Payer: No Typology Code available for payment source | Attending: Audiology | Admitting: Audiology

## 2013-06-03 DIAGNOSIS — H8393 Unspecified disease of inner ear, bilateral: Secondary | ICD-10-CM

## 2013-06-03 DIAGNOSIS — H903 Sensorineural hearing loss, bilateral: Secondary | ICD-10-CM | POA: Insufficient documentation

## 2013-06-03 NOTE — Patient Instructions (Addendum)
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  Her speech understanding, even in a quiet listening environment with no background noise is also impaired.  Middle ear function is normal as type A tympanograms were obtained on both sides.  Acoustic reflexes are absent with ipsilateral stimulation from 500Hz  - 8000Hz .  Distortion Product Otoacoustic Emissions were tested from 2000Hz  - 10000Hz  and were absent bilaterally indicative of poor outer hair cell function within the inner ear.  Recommendations:  1.  Please follow up with Dr. Brion Aliment for medical management of your vertigo. 2.  Alyssa Hall is a hearing aid candidate and a trial fitting is recommended.  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 3.  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  An Assistive Listening Device (FM system)  will be critical in her success in a classroom setting and should be made available to her though the college for use during classroom work.   4.  Other Classroom modifications for the hearing impaired student include:  a. Listening to clear speech  b. Slightly slower speech with appropriate pauses c. Compliment with visual information to help fill in missing auditory information write new vocabulary on chalkboard d. Prior knowledge of new vocabulary and new/complex concepts.  Allow access to new information prior to it being presented in class.  Providing notes, power point slides or overhead projector sheets the day before the class in which they will be presented will be of significant benefit. e. Preferential seating is a must and is usually considered to be within 10 feet from where the teacher generally speaks.  -  as much as possible this should be away from noise sources, such as hall or street noise,  ventilation fans or overhead projector noise etc.   f. Please allow the student to tape record classes for review later at home or to use a "smart pen" for note taking. This is essential for the child with an auditory processing deficit.  Many colleges has "smart pens" available for loan.  Alyssa Hall Sheila Oats  Doctor of Audiology 06/03/13 11:06 AM

## 2013-06-03 NOTE — Procedures (Signed)
[facility] [address] [place], [place]  [address] [phone]  AUDIOLOGICAL EVALUATION  Patient Name: Alyssa Hall  Medical Record Number:  [id number] Date of Birth:  10-20-1985    Date of Test:  06/03/2013  HISTORY:  Alyssa Hall, a 27 y.o. old was seen for audiological evaluation upon referral of Rocco Serene, MD.   The patient reported vertigo accompanied by fullness, tinnitus, nausea and hearing loss that began in December 2013 on the left side and has worsened to the other ear as well.  Further, she describes a nystagmus of the left eye.  She was seen recently by Dr. Brion Aliment ENT who has prescribed Hygroton and Potassium with a slight improvement reported.  Significant Illness: Graves, hypothyroid (acquired) Medications:  Synthroid, Hygroton, Pottasium Noise Exposure:  No Middle Ear Disease: No Headaches: No Facial Numbness:  No Vertigo: Yes Tinnitus: Yes Family History Of Hearing Loss:  No Communication Difficulties: Yes  REPORT OF PAIN:  None  EVALUATION:   Air and bone conduction audiometry from 500Hz  - 8000Hz  utilizing standard  earphones revealed moderate to mild sensory neural loss bilaterally.   Speech reception thresholds were consistent with the pure tone results indicative of good test reliability.  Speech recognition testing was conducted in each ear independently, at a comfortable listening level 65dBHL and indicated 84% and 92% in the right and left ears respectively.  Impedance audiometry was utilized and Type A was obtained on the right side and a Type A was obtained on the left side suggesting normal middle ear functioning.   Acoustic reflexes were tested from 500Hz  - 4000Hz  and were absent on the right side and absent on the left side.  Distortion Product Otoacoustic Emissions were tested from 2000Hz  - 10000Hz  and were absent on the right side and absnet on the left side, indicative of poor outer hair cell  function within the inner ear.  CONCLUSION: 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  Her speech understanding, even in a quiet listening environment with no background noise is also impaired.  Middle ear function is normal as type A tympanograms were obtained on both sides.  Acoustic reflexes are absent with ipsilateral stimulation from 500Hz  - 8000Hz .  Distortion Product Otoacoustic Emissions were tested from 2000Hz  - 10000Hz  and were absent bilaterally indicative of poor outer hair cell function within the inner ear.  Her vertigo, tinnitus and upward slopping audiogram with sensory neural hearing loss are consistent with Meniere's Disease.  RECOMMENDATIONS: 1.  Please follow up with Dr. Brion Aliment for medical management of your vertigo. 2.  [name] is a hearing aid candidate and a trial fitting is recommended.  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 3.  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  An Assistive Listening Device (FM system)  will be critical in her success in a classroom setting and should be made available to her though the college for use during classroom work.   4.  Other Classroom modifications for the hearing impaired student include:  a. Listening to clear speech  b. Slightly slower speech with appropriate pauses c. Compliment with visual information to help fill in missing auditory information write new vocabulary on chalkboard d. Prior knowledge of new vocabulary and new/complex concepts.  Allow access to new  information prior to it being presented in class.  Providing notes, power point slides or overhead projector sheets the day before the class in which they will be presented will be of significant benefit. e. Preferential seating is a must and is  usually considered to be within 10 feet from where the teacher generally speaks.  -  as much as possible this should be away from noise sources, such as hall or street noise, ventilation fans or overhead projector noise etc.   f. Please allow the student to tape record classes for review later at home or to use a "smart pen" for note taking. This is essential for the child with an auditory processing deficit.  Many colleges has "smart pens" available for loan.  Allyn Kenner Sheila Oats  Doctor of Audiology 06/03/13  cc: Rocco Serene, MD

## 2013-06-15 ENCOUNTER — Other Ambulatory Visit: Payer: Self-pay | Admitting: *Deleted

## 2013-06-15 ENCOUNTER — Telehealth: Payer: Self-pay | Admitting: *Deleted

## 2013-06-15 MED ORDER — LEVOTHYROXINE SODIUM 100 MCG PO TABS: 100.0000 ug | ORAL_TABLET | Freq: Every day | ORAL | Status: DC

## 2013-06-15 NOTE — Telephone Encounter (Signed)
Received call from [facility] with [facility], she wanted to inform our office that pt was a no show for an eye appt scheduled on Sept 29th with [facility].  Pt called [facility] today (10/8) to inform them that she was unable to keep appt and asked to be rescheduled.  Per Maye Hides , this is the office second attempt to get her an appt that she was unable to keep. The office will not be able to reschedule her at this time. Pt aware.Kingsley Spittle Cassady10/8/201411:13 AM

## 2013-06-20 NOTE — Telephone Encounter (Signed)
Called to pharm 

## 2013-07-12 ENCOUNTER — Encounter: Payer: Self-pay | Admitting: Internal Medicine

## 2013-07-12 ENCOUNTER — Telehealth: Payer: Self-pay | Admitting: *Deleted

## 2013-07-12 ENCOUNTER — Ambulatory Visit (INDEPENDENT_AMBULATORY_CARE_PROVIDER_SITE_OTHER): Payer: No Typology Code available for payment source | Admitting: Internal Medicine

## 2013-07-12 VITALS — BP 117/79 | HR 93 | Temp 97.8°F | Ht 63.0 in | Wt 214.1 lb

## 2013-07-12 DIAGNOSIS — R109 Unspecified abdominal pain: Secondary | ICD-10-CM | POA: Insufficient documentation

## 2013-07-12 DIAGNOSIS — N730 Acute parametritis and pelvic cellulitis: Secondary | ICD-10-CM

## 2013-07-12 LAB — URINALYSIS, ROUTINE W REFLEX MICROSCOPIC
Bilirubin Urine: NEGATIVE
Glucose, UA: NEGATIVE mg/dL
Specific Gravity, Urine: 1.022 (ref 1.005–1.030)
pH: 6.5 (ref 5.0–8.0)

## 2013-07-12 NOTE — Telephone Encounter (Signed)
Pt walked into clinic with c/o abd pain 5 days post cycle.  Pain is over entire abd,  pain is on and off, sharp and she rates it 5/10.  Non radiating. No other c/o  Same type pain,after cycle about once a year.  Tylenol will help some.  Pain last several days.   She did stating she has spotted a little . Hx: uterine fibroids  Will see today

## 2013-07-12 NOTE — Assessment & Plan Note (Signed)
Patient intermittent abdominal pain on and off for 4 years they usually coincides with her menstrual periods. Patient has been sexually active with the same partner for 7 months though does not use any form of contraception which puts her at risk for multiple STDs as well as pregnancy. Given her abdominal pain and recent spotting I think it is important to rule out ectopic pregnancy with a urine pregnancy test. I would also like to examine her urine for possible UTI. She has not exhibited any signs or symptoms of systemic infection such as CVAT, fevers, chills; therefore, I do not believe pyelonephritis is likely. Additionally patient does not have signs of peritonitis on exam, nor did she have a concerning pelvic exam-no cervical motion tenderness or copious discharge, therefore I believe that PID or ruptured ectopic pregnancy is unlikely. Patient has a history of fibroids and ovarian cysts seen on pelvic ultrasound in 2013, therefore I believe that either of these may be the cause of her recurrent abdominal pain. I ordered a transvaginal ultrasound to be done as an outpatient to evaluate for changes to her fibroids, enlargement of her previously known cysts, or other cyst such as a corpus luteum cyst. She has been worked up multiple times in the past for sexually transmitted diseases, however am not able to see where she has tested positive for any in the past. She has had a positive wet prep for clue cells as well as pronounced Gardnerella, but no positive trichomoniasis, GC, chlamydia that I am aware of. I offered to prescribe pain medication for patient's abdominal pain, however patient thinks that her pain is well-controlled on ibuprofen at home. -Ibuprofen prn pain  -Sent GC, Chlamydia, wet prep -Ordered transvaginal ultrasound -I discussed at length with the patient importance of using contraception not just to prevent pregnancy but also to prevent STDs. She is not interested in becoming pregnant at this  time in fact she really does not want to be pregnant. I told her that by not using any contraception she is at high risk for becoming pregnant given that she is sexually active. She understands and is willing to discuss contraception options at her next visit. -Patient to followup later this week to assess resolution of symptoms and discuss contraception options  ADDENDUM UPT negative, UA clean

## 2013-07-12 NOTE — Progress Notes (Signed)
[name], female   DOB: 11/29/1985, 27 y.o.   MRN: [id number] HPI The patient is a [id number] 27 y.o. female with a history of hypothyroidism, Mnire's disease, generalized anxiety disorder, DUB,? PID who presents for an acute visit.  Patient reports having intermittent sharp lower abdominal pain for the past 5 days. She reports the pain comes on and lasts 2-3 minutes, occurs every hour. Patient reports that she has had this same pain associated with her menstrual periods for the past 4 years, the pain today is the same as her usual pain. Patient reports having her last menstrual period beginning last week and ended approximately 5 days ago. She reports that after her period ended she began having this pain. This is atypical in the sense that patient usually has pain associated with her periods. She reports that her vaginal bleeding had completely stopped until today when she noticed a small amount of blood on her underwear. No other vaginal discharge, unusual or foul odor, vaginal itching, dysuria. She is sexually active and has been with the same partner for 7 months, last sexual activity was 3 days ago. Patient reports having intermittent dyspareunia and did have some pain with intercourse 3 days ago. Patient does not use any form of contraception. Patient's problem list does include PID, but I do not see any positive tests indicating infection during previous workups. She did however test positive for clue cells as well as gardnerella in the past. She also has had a transvaginal ultrasound in 2013 which showed multiple uterine fibroids as well as bilateral ovarian cysts. Patient was seen in the emergency department on 05/12/2013 at which time she had a negative UA, UPT, GC chlamydia, wet prep and HIV antibody. She reports her periods usually last approximately 6 days and come at regular intervals approximately every 4 weeks. This has been unchanged recently.  ROS: General: no fevers, chills,  changes in weight, changes in appetite Skin: no rash HEENT: no blurry vision, hearing changes, sore throat Pulm: no dyspnea, coughing, wheezing CV: no chest pain, palpitations, shortness of breath Abd: See history of present illness GU: See history of present illness  Ext: no arthralgias, myalgias Neuro: no weakness, numbness, or tingling  Filed Vitals:   07/12/13 1104  BP: 117/79  Pulse: 93  Temp: 97.8 F (36.6 C)   Physical Exam General: alert, cooperative, and in no apparent distress HEENT: pupils equal round and reactive to light, vision grossly intact, oropharynx clear and non-erythematous  Neck: supple Lungs: clear to ascultation bilaterally, normal work of respiration, no wheezes, rales, ronchi Heart: regular rate and rhythm, no murmurs, gallops, or rubs Abdomen: overweight, soft, TTP right middle abdomen and suprapubic region, non-distended, normal bowel sounds; no CVAT; no rebound or guarding GU: Normal-appearing external genitalia, normal, pink vaginal rugae; cervix is normal appearing, cervical os with mild clear to yellow discharge; no cervical motion tenderness; no palpable masses on bimanual exam Extremities: no BLE edema, warm extremities Neurologic: alert & oriented X3, strength and strength grossly intact  Current Outpatient Prescriptions on File Prior to Visit  Medication Sig Dispense Refill  . chlorthalidone (HYGROTON) 25 MG tablet Take 25 mg by mouth daily.      Marland Kitchen levothyroxine (SYNTHROID, LEVOTHROID) 100 MCG tablet Take 1 tablet (100 mcg total) by mouth daily.  90 tablet  0  . Multiple Vitamin (MULTIVITAMIN WITH MINERALS) TABS Take 1 tablet by mouth daily.      . ondansetron (ZOFRAN) 4 MG tablet Take 1 tablet (4  mg total) by mouth every 8 (eight) hours as needed for nausea.  10 tablet  0  . potassium chloride (K-DUR) 10 MEQ tablet Take 40 mEq by mouth daily.      . [DISCONTINUED] atenolol (TENORMIN) 100 MG tablet Take 100 mg by mouth 2 (two) times daily as  needed. For  tremors, palpitations, anxiety.       No current facility-administered medications on file prior to visit.    Assessment/Plan

## 2013-07-12 NOTE — Patient Instructions (Signed)
Due for your appointment. Please follow up with me or another physician in our clinic by Friday this week. If your symptoms worsen or you have increased vaginal bleeding or discharge you should be seen immediately by a provider either at the Emergency Department or here in this clinic.  Today we checked for various sexually transmitted diseases (gonorrhea, Chlamydia, bacterial vaginosis, Trichomonas). I also checked your urine for infection and pregnancy. I will call you with these results. I also ordered a transvaginal ultrasound for you to have done as an outpatient. Please have this done as soon as possible.  I recommend that you start using some form of contraception (condoms, oral contraceptives) as you're at high risk for pregnancy and sexually transmitted diseases without it. I'm happy to discuss this with you at your next visit if you decide you want to pursue this option.

## 2013-07-13 NOTE — Progress Notes (Signed)
I saw and evaluated the patient.  I personally confirmed the key portions of the history and exam documented by Dr. Chikowski and I reviewed pertinent patient test results.  The assessment, diagnosis, and plan were formulated together and I agree with the documentation in the resident's note. 

## 2013-07-14 ENCOUNTER — Telehealth: Payer: Self-pay | Admitting: Internal Medicine

## 2013-07-14 ENCOUNTER — Other Ambulatory Visit: Payer: Self-pay

## 2013-07-14 NOTE — Telephone Encounter (Signed)
I spoke with patient and told her of her UA, UPT, wet prep, GC/Chlamydia results. All tests were negative except she was +Gardnerella, which she has been positive for in the past. No treatment indicated at this time. Patient asked if she would benefit from going ahead and obtaining the vaginal ultrasound. I told her I thought it would be a good idea to do the ultrasound given her history of recurrent abdominal cramping in the setting of known uterine fibroids and ovarian cysts. She agrees to have the ultrasound performed. All of her questions were answered.

## 2013-07-18 ENCOUNTER — Other Ambulatory Visit (HOSPITAL_COMMUNITY): Payer: No Typology Code available for payment source

## 2013-07-20 ENCOUNTER — Telehealth: Payer: Self-pay | Admitting: Internal Medicine

## 2013-07-20 ENCOUNTER — Ambulatory Visit (HOSPITAL_COMMUNITY)
Admission: RE | Admit: 2013-07-20 | Discharge: 2013-07-20 | Disposition: A | Discharge status: Home / Self Care | Payer: No Typology Code available for payment source | Source: Ambulatory Visit | Attending: Internal Medicine | Admitting: Internal Medicine

## 2013-07-20 DIAGNOSIS — D259 Leiomyoma of uterus, unspecified: Secondary | ICD-10-CM | POA: Insufficient documentation

## 2013-07-20 DIAGNOSIS — R109 Unspecified abdominal pain: Secondary | ICD-10-CM

## 2013-07-20 DIAGNOSIS — N898 Other specified noninflammatory disorders of vagina: Secondary | ICD-10-CM | POA: Insufficient documentation

## 2013-07-20 NOTE — Telephone Encounter (Signed)
Attempted to call [name] to discuss pelvic ultrasound results today at [phone], left a message that I would call her back later this week on Mobile number.   Debe Coder, MD

## 2013-07-22 ENCOUNTER — Telehealth: Payer: Self-pay | Admitting: Internal Medicine

## 2013-07-22 DIAGNOSIS — N938 Other specified abnormal uterine and vaginal bleeding: Secondary | ICD-10-CM

## 2013-07-22 NOTE — Telephone Encounter (Signed)
Called and spoke with [name] regarding her [place] results.  Confirmed identity with birthdate.    I advised her about the findings including the fibroids and endometrial stripe and recommendation to repeat the scan in 6-8 weeks after menses.    [name] was concerned about fertility problems in women with fibroids and we talked about options for treatment and possibility that a fibroid could limit her ability to get pregnant.  I offered a referral to a gynecologist to discuss further options for fibroid therapy and a more expert opinion about her fertility options.  She agreed to this plan.  Will place referral.  I am not sure what nurse was working with [name] and Dr. Darci Needle at her appointment.  Will forward to [facility] to request her assistance in this referral.   Debe Coder, MD

## 2013-08-03 ENCOUNTER — Ambulatory Visit (INDEPENDENT_AMBULATORY_CARE_PROVIDER_SITE_OTHER): Payer: No Typology Code available for payment source | Admitting: Internal Medicine

## 2013-08-03 ENCOUNTER — Encounter: Payer: Self-pay | Admitting: Internal Medicine

## 2013-08-03 VITALS — BP 112/76 | HR 118 | Temp 98.6°F | Ht 64.0 in | Wt 213.5 lb

## 2013-08-03 DIAGNOSIS — H538 Other visual disturbances: Secondary | ICD-10-CM

## 2013-08-03 DIAGNOSIS — H579 Unspecified disorder of eye and adnexa: Secondary | ICD-10-CM

## 2013-08-03 DIAGNOSIS — Z0000 Encounter for general adult medical examination without abnormal findings: Secondary | ICD-10-CM

## 2013-08-03 MED ORDER — CARBOXYMETHYLCELL-HYPROMELLOSE 0.25-0.3 % OP GEL: 1.0000 [drp] | OPHTHALMIC | Status: DC | PRN

## 2013-08-03 NOTE — Progress Notes (Signed)
[name], female   DOB: 1986/08/19, 27 y.o.   MRN: [id number]   Subjective:   [name]   DOB: [birth year] 27 y.o.   MRN: [id number]  HPI: [name] is a 27 y.o. with PMH of menierres dx, hypothyroidism- status post ablation for Graves, and anxiety.   Presented today with complaints of pressure in her eyes with blurred vision over the past month. Also complaints of some itching of her eyes, and increased tearing when starring at bright lights. No double vision,  No headaches, no nasal congestion, or runny nose, no hx of bulging eyes or eye problems when she had graves dx, before the ablation. . Pt has had similar complaints in the past for which she saw an optometrist, and was given prescription eye glassess, with complete resolution of her symptoms. Pt has not worn her eye glasses since January this year, but says there is no reason why she has not been wearing them.   Past Medical History  Diagnosis Date  . Thyrotoxicosis   . Atrial tachycardia   . Dizziness   . Hypothyroid   . Depression   . Anxiety   . Meniere's disease    Current Outpatient Prescriptions  Medication Sig Dispense Refill  . Carboxymethylcell-Hypromellose 0.25-0.3 % GEL Apply 1 drop to eye as needed.  1 Bottle  0  . chlorthalidone (HYGROTON) 25 MG tablet Take 25 mg by mouth daily.      Marland Kitchen levothyroxine (SYNTHROID, LEVOTHROID) 100 MCG tablet Take 1 tablet (100 mcg total) by mouth daily.  90 tablet  0  . Multiple Vitamin (MULTIVITAMIN WITH MINERALS) TABS Take 1 tablet by mouth daily.      . ondansetron (ZOFRAN) 4 MG tablet Take 1 tablet (4 mg total) by mouth every 8 (eight) hours as needed for nausea.  10 tablet  0  . potassium chloride (K-DUR) 10 MEQ tablet Take 40 mEq by mouth daily.      . [DISCONTINUED] atenolol (TENORMIN) 100 MG tablet Take 100 mg by mouth 2 (two) times daily as needed. For  tremors, palpitations, anxiety.       No current facility-administered  medications for this visit.   No family history on file. History   Social History  . Marital Status: Single    Spouse Name: N/A    Number of Children: N/A  . Years of Education: N/A   Social History Main Topics  . Smoking status: Never Smoker   . Smokeless tobacco: Never Used  . Alcohol Use: No  . Drug Use: No  . Sexual Activity: None   Other Topics Concern  . None   Social History Narrative   Uninsured.   Lives in [place].         Review of Systems: CONSTITUTIONAL- No  Fever, weightloss, night sweat,or change in appetite. SKIN- No Rash, colour changes, itching. HEAD- No Headache, or dizziness. Mouth/throat- Sorethroat, dentures, bleeding gums. RESPIRATORY- Cough, SOB. CARDIAC- Palpitations, DOE, PND, chest pain. GI- Dysphagia, nausea, vomiting, diarrhoea, constipation, abd pain, jaundice, last colonoscopy. URINARY- Frequency, polyuria, nocturia, hesitancy, urgency,incontinence NEUROLOGIC- Numbness, syncope, burning. PYSCH- Mood, depression, anxiety.    Objective:  Physical Exam: Filed Vitals:   08/03/13 1403  BP: 112/76  Pulse: 118  Temp: 98.6 F (37 C)  TempSrc: Oral  Height: 5\' 4"  (1.626 m)  Weight: 213 lb 8 oz (96.843 kg)  SpO2: 100%   GENERAL- alert, co-operative, appears as stated age, not in any distress. HEENT- Atraumatic, normocephalic, PERRL,  EOMI, sclera appear a bit red, oral mucosa appears moist, no cervical LN enlargement, thyroid does not appear enlarged. CARDIAC- RRR, no murmurs, rubs or gallops. RESP- Moving equal volumes of air, and clear to auscultation bilaterally. ABDOMEN- Soft, nontender, no palpable masses or organomegaly, bowel sounds present. NEURO- No Cr N 2-12 abnormality, strenght equal and present in all extremities. EXTREMITIES- pulse 2+, symmetric, no pedal edema.Marland Kitchen SKIN- Warm, dry, No rash or lesion. PSYCH- Normal mood and affect, appropriate thought content and speech.  Assessment & Plan:  The patient's case and plan  of care was discussed with attending physician, Dr. Irine Seal.  Please see problem based chatting for assessment and plan.

## 2013-08-03 NOTE — Assessment & Plan Note (Signed)
Declined the flu shot.

## 2013-08-03 NOTE — Assessment & Plan Note (Addendum)
Blurred vision, can not see objects at a distance, also complaining of pressure in her eyes today. Previously wore glasses. Asking for refferral today to have her eyes checked. Pt has the orange card.  Plan- Optometry refferal today. - For itching and tearing eyes while starring at [facility] light, possible dry eyes, she says she was given artioficail tears by her previous optometrist, but this did not help, will prescribe- Genteal gel- 0.25-0.3%  to help with lubrication.

## 2013-08-03 NOTE — Patient Instructions (Signed)
We will be getting you an Optometry refferal, but it would be next month.   We also prescribed a medication which you can put into your eyes, to help with the tearing and itching.    Continue with all your Medications as prescribed. Also continue taking your iron pills.

## 2013-08-03 NOTE — Progress Notes (Signed)
I saw and evaluated the patient.  I personally confirmed the key portions of the history and exam documented by Dr. Mariea Hall and I reviewed pertinent patient test results.  The assessment, diagnosis, and plan were formulated together and I agree with the documentation in the resident's note. [name] has no proptosis on exam.

## 2013-08-09 ENCOUNTER — Encounter: Payer: Self-pay | Admitting: Obstetrics and Gynecology

## 2013-09-10 ENCOUNTER — Emergency Department (HOSPITAL_COMMUNITY)
Admission: EM | Admit: 2013-09-10 | Discharge: 2013-09-10 | Disposition: A | Discharge status: Home / Self Care | Payer: Self-pay | Attending: Emergency Medicine | Admitting: Emergency Medicine

## 2013-09-10 ENCOUNTER — Emergency Department (HOSPITAL_COMMUNITY): Payer: Self-pay

## 2013-09-10 ENCOUNTER — Encounter (HOSPITAL_COMMUNITY): Payer: Self-pay | Admitting: Emergency Medicine

## 2013-09-10 DIAGNOSIS — H748X9 Other specified disorders of middle ear and mastoid, unspecified ear: Secondary | ICD-10-CM | POA: Insufficient documentation

## 2013-09-10 DIAGNOSIS — Z8669 Personal history of other diseases of the nervous system and sense organs: Secondary | ICD-10-CM | POA: Insufficient documentation

## 2013-09-10 DIAGNOSIS — E039 Hypothyroidism, unspecified: Secondary | ICD-10-CM | POA: Insufficient documentation

## 2013-09-10 DIAGNOSIS — Z8659 Personal history of other mental and behavioral disorders: Secondary | ICD-10-CM | POA: Insufficient documentation

## 2013-09-10 DIAGNOSIS — J069 Acute upper respiratory infection, unspecified: Secondary | ICD-10-CM | POA: Insufficient documentation

## 2013-09-10 DIAGNOSIS — R000 Tachycardia, unspecified: Secondary | ICD-10-CM | POA: Insufficient documentation

## 2013-09-10 DIAGNOSIS — Z79899 Other long term (current) drug therapy: Secondary | ICD-10-CM | POA: Insufficient documentation

## 2013-09-10 MED ORDER — HYDROCODONE-HOMATROPINE 5-1.5 MG/5ML PO SYRP: 5.0000 mL | ORAL_SOLUTION | Freq: Four times a day (QID) | ORAL | Status: DC | PRN

## 2013-09-10 MED ORDER — ACETAMINOPHEN 325 MG PO TABS
650.0000 mg | ORAL_TABLET | Freq: Once | ORAL | Status: AC
  Administered 2013-09-10: 650 mg via ORAL
  Filled 2013-09-10: qty 2

## 2013-09-10 NOTE — ED Notes (Addendum)
Reports having a cough and congestion x 3 weeks, yellow sputum production. Airway intact, no acute distress noted at triage.HR 120 at triage.

## 2013-09-10 NOTE — ED Provider Notes (Addendum)
CSN: [id number]     Arrival date & time 09/10/13  1437 History   First MD Initiated Contact with Patient 09/10/13 1553     Chief Complaint  Patient presents with  . Cough   (Consider location/radiation/quality/duration/timing/severity/associated sxs/prior Treatment) Patient is a 28 y.o. female presenting with cough. The history is provided by the patient.  Cough Cough characteristics:  Productive Sputum characteristics:  Yellow Severity:  Severe Onset quality:  Gradual Duration: states had URI sx 3 weeks ago that went away and then returned about 1 week ago. 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 Thyrotoxicosis   . Atrial tachycardia   . Dizziness   . Hypothyroid   . Depression   . Anxiety   . Meniere's disease    History reviewed. No pertinent past surgical history. History reviewed. No pertinent family history. History  Substance Use Topics  . Smoking status: Never Smoker   . Smokeless tobacco: Never Used  . Alcohol Use: No   OB History   Grav Para Term Preterm Abortions TAB SAB Ect Mult Living   1     0         Review of Systems  Constitutional: Positive for fever.  HENT: Positive for rhinorrhea and sore throat.   Respiratory: Positive for cough. Negative for shortness of breath and wheezing.   Cardiovascular: Negative for chest pain.  All other systems reviewed and are negative.    Allergies  Review of patient's allergies indicates no known  allergies.  Home Medications   Current Outpatient Rx  Name  Route  Sig  Dispense  Refill  . Carboxymethylcell-Hypromellose 0.25-0.3 % GEL   Ophthalmic   Apply 1 drop to eye as needed.   1 Bottle   0   . chlorthalidone (HYGROTON) 25 MG tablet   Oral   Take 25 mg by mouth daily.         Marland Kitchen levothyroxine (SYNTHROID, LEVOTHROID) 100 MCG tablet   Oral   Take 1 tablet (100 mcg total) by mouth daily.   90 tablet   0   . Multiple Vitamin (MULTIVITAMIN WITH MINERALS) TABS   Oral   Take 1 tablet by mouth daily.         . ondansetron (ZOFRAN) 4 MG tablet   Oral   Take 1 tablet (4 mg total) by mouth every 8 (eight) hours as needed for nausea.   10 tablet   0   . potassium chloride (K-DUR) 10 MEQ tablet   Oral   Take 40 mEq by mouth daily.          BP 127/75  Pulse 120  Temp(Src) 100.4 F (38 C) (Oral)  Resp 22  SpO2 99%  LMP 08/24/2013 Physical Exam  Nursing note and vitals reviewed. Constitutional: She is oriented to person, place, and time. She appears well-developed and well-nourished. No distress.  HENT:  Head: Normocephalic and atraumatic.  Right Ear: A middle ear effusion is present.  Left  Ear: A middle ear effusion is present.  Nose: Mucosal edema and rhinorrhea present.  Mouth/Throat: Posterior oropharyngeal erythema present. No oropharyngeal exudate or posterior oropharyngeal edema.  Eyes: Conjunctivae and EOM are normal. Pupils are equal, round, and reactive to light.  Neck: Normal range of motion. Neck supple.  Cardiovascular: Regular rhythm and intact distal pulses.  Tachycardia present.   No murmur heard. Pulmonary/Chest: Effort normal and breath sounds normal. No respiratory distress. She has no wheezes. She has no rales.  Abdominal: Soft. She exhibits no distension. There is no tenderness. There is no rebound and no guarding.  Musculoskeletal: Normal range of motion. She exhibits no edema and no tenderness.  Lymphadenopathy:    She has no cervical  adenopathy.  Neurological: She is alert and oriented to person, place, and time.  Skin: Skin is warm and dry. No rash noted. No erythema.  Psychiatric: She has a normal mood and affect. Her behavior is normal.    ED Course  Procedures (including critical care time) Labs Review Labs Reviewed - No data to display Imaging Review Dg Chest 2 View  09/10/2013   CLINICAL DATA:  Cough and fever.  EXAM: CHEST  2 VIEW  COMPARISON:  01/21/2012  FINDINGS: The heart size and mediastinal contours are within normal limits. Both lungs are clear. The visualized skeletal structures are unremarkable.  IMPRESSION: No active cardiopulmonary disease.   Electronically Signed   By: Earle Gell M.D.   On: 09/10/2013 18:45    EKG Interpretation   None       MDM   1. URI, acute     Pt with symptoms consistent with influenza.  Normal exam here but is febrile.  No signs of breathing difficulty  No signs of strep pharyngitis, otitis or abnormal abdominal findings.   CXR wnl. Will continue antipyretica and rest and fluids and return for any further problems.  Pt not high risk candidate for tamiflu.  6:49 PM Pt d/ced home with supportive care.   Blanchie Dessert, MD 09/10/13 [place], MD 09/10/13 [phone]

## 2013-09-10 NOTE — Discharge Instructions (Signed)
Also you can use mucinex and benadryl for symptoms

## 2013-09-22 ENCOUNTER — Ambulatory Visit (INDEPENDENT_AMBULATORY_CARE_PROVIDER_SITE_OTHER): Payer: Self-pay | Admitting: Obstetrics and Gynecology

## 2013-09-22 ENCOUNTER — Encounter: Payer: Self-pay | Admitting: Obstetrics and Gynecology

## 2013-09-22 VITALS — BP 134/86 | HR 105 | Temp 98.2°F | Ht 63.0 in | Wt 215.5 lb

## 2013-09-22 DIAGNOSIS — N949 Unspecified condition associated with female genital organs and menstrual cycle: Secondary | ICD-10-CM

## 2013-09-22 DIAGNOSIS — N925 Other specified irregular menstruation: Secondary | ICD-10-CM

## 2013-09-22 DIAGNOSIS — N938 Other specified abnormal uterine and vaginal bleeding: Secondary | ICD-10-CM

## 2013-09-22 DIAGNOSIS — D259 Leiomyoma of uterus, unspecified: Secondary | ICD-10-CM | POA: Insufficient documentation

## 2013-09-22 NOTE — Progress Notes (Signed)
Pt reports clots with period and extreme cramping from third day to seventh of period.

## 2013-09-22 NOTE — Progress Notes (Signed)
[name], female   DOB: 01-07-1986, 28 y.o.   MRN: [id number] 28 yo G0 presenting today for evaluation of fibroid uterus and excessive vaginal bleeding. Patient reports monthly 7-day period which are heavy with passage of clots, accompanied by dysmenorrhea. Patient was prescribed birth control to help manage the bleeding but has not taken them due to fear of weight gain. Patient is concerned of growing fibroids and their impact on her fertility. Patient is currently sexually active and is not actively seeking pregnancy.  Past Medical History  Diagnosis Date  . Thyrotoxicosis   . Atrial tachycardia   . Dizziness   . Hypothyroid   . Depression   . Anxiety   . Meniere's disease    No past surgical history on file. No family history on file. History  Substance Use Topics  . Smoking status: Never Smoker   . Smokeless tobacco: Never Used  . Alcohol Use: No      GENERAL: Well-developed, well-nourished female in no acute distress.  ABDOMEN: Soft, nontender, nondistended. Obese PELVIC: Normal external female genitalia. Vagina is pink and rugated.  Normal discharge. Normal appearing cervix. Bimanual exam limited secondary to body habitus.No adnexal mass or tenderness. EXTREMITIES: No cyanosis, clubbing, or edema, 2+ distal pulses.   07/2013 ultrasound: IMPRESSION:  Enlarged uterus with enlarging uterine fibroids, the largest 3.7 cm  in the anterior fundus.  Thickened endometrium, most pronounced in the mid portion of the  uterus measuring up to 19 mm. This is similar to prior study.  Endometrial thickness is considered abnormal. Consider follow-up by  [place] in 6-8 weeks, during the week immediately following menses (exam  timing is critical).  2.8 cm simple appearing right adnexal cyst, likely paraovarian cyst.    A/P 27 yo with fibroid uterus and menorrhagia - Reassurance provided regarding fibroids - Advised to start prescribed birth control to help control excessive  flow and dysmenorrhea - Advised to return if symptoms persists despite birth control for further management - At this time patient left reassured that no surgical interventions were necessary. - RTC prn

## 2013-09-23 ENCOUNTER — Ambulatory Visit (INDEPENDENT_AMBULATORY_CARE_PROVIDER_SITE_OTHER): Payer: Self-pay | Admitting: Internal Medicine

## 2013-09-23 ENCOUNTER — Encounter: Payer: Self-pay | Admitting: Internal Medicine

## 2013-09-23 VITALS — BP 118/83 | HR 108 | Temp 97.4°F | Ht 64.0 in | Wt 217.8 lb

## 2013-09-23 DIAGNOSIS — D649 Anemia, unspecified: Secondary | ICD-10-CM

## 2013-09-23 DIAGNOSIS — N949 Unspecified condition associated with female genital organs and menstrual cycle: Secondary | ICD-10-CM

## 2013-09-23 DIAGNOSIS — E039 Hypothyroidism, unspecified: Secondary | ICD-10-CM

## 2013-09-23 DIAGNOSIS — H8109 Meniere's disease, unspecified ear: Secondary | ICD-10-CM

## 2013-09-23 DIAGNOSIS — H04123 Dry eye syndrome of bilateral lacrimal glands: Secondary | ICD-10-CM | POA: Insufficient documentation

## 2013-09-23 DIAGNOSIS — H8102 Meniere's disease, left ear: Secondary | ICD-10-CM

## 2013-09-23 DIAGNOSIS — N925 Other specified irregular menstruation: Secondary | ICD-10-CM

## 2013-09-23 DIAGNOSIS — N938 Other specified abnormal uterine and vaginal bleeding: Secondary | ICD-10-CM

## 2013-09-23 DIAGNOSIS — H04129 Dry eye syndrome of unspecified lacrimal gland: Secondary | ICD-10-CM

## 2013-09-23 DIAGNOSIS — Z0000 Encounter for general adult medical examination without abnormal findings: Secondary | ICD-10-CM

## 2013-09-23 MED ORDER — CARBOXYMETHYLCELL-HYPROMELLOSE 0.25-0.3 % OP GEL: 1.0000 [drp] | Freq: Three times a day (TID) | OPHTHALMIC | Status: DC | PRN

## 2013-09-23 MED ORDER — NORETHIN-ETH ESTRAD-FE BIPHAS 1 MG-10 MCG / 10 MCG PO TABS: 1.0000 | ORAL_TABLET | Freq: Every day | ORAL | Status: DC

## 2013-09-23 NOTE — Assessment & Plan Note (Addendum)
Patient reporting symptoms concerning for anemia with weakness, fatigue, pica.  She has heavy menstrual periods with a history of iron deficiency anemia.  Last stopped iron supplementation in 01/2013.  Starting OCP today which will hopefully improve excessive flow.   -anemia panel today

## 2013-09-23 NOTE — Assessment & Plan Note (Signed)
Patient has history of fibroids and DUB.  Saw OBGYN yesterday who recommended that she start OCP.  Patient is amenable, not interested in becoming pregnant at this time.  Explained importance of not missing doses and taking medication at same time each day.  Also reiterated that this will not protect her against STDs, and she should also use barrier method.    -Loestrin Fe; follow-up in 1 month to see how she is tolerating

## 2013-09-23 NOTE — Assessment & Plan Note (Signed)
Stable, no recent attacks.  She is followed by ENT.

## 2013-09-23 NOTE — Progress Notes (Signed)
[name], female   DOB: 25-May-1986, 28 y.o.   MRN: [id number]   Subjective:   [name]   DOB: 14-May-1986 28 y.o.   MRN: [id number]  HPI: [name] is a 28 y.o. woman with history of Meniere's disease, hypothyroidism, DUB who presents for routine follow-up.   Patient states that she is worried she has iron deficiency anemia again because she has been feeling weak and tired as well as having some tingling in her fingertips for the past month.  She has also been craving ice for the past week.  These are similar symptoms to when she was diagnosed with iron deficiency anemia previously.  She reports continued heavy 7 day menstrual periods.   She is also complaining of dry eye.  She often feels like she has sand in her eyes which is associated with watering, causing her to blink and squint frequently.  She does not wear contacts lenses; has been told she needs glasses but does not wear them either.  She has tried using Systane with only temporary relief.  She never received Genteal gel that was ordered at her last visit.  She was referred to ophthalmology but did not show for her appointment; explained that we would not be able to refer her again for another 6 months.   Finally, she is now interested in taking an oral contraceptive.  She saw OBGYN yesterday who advised her to start OCP to help control excessive flow.     Past Medical History  Diagnosis Date  . Thyrotoxicosis   . Atrial tachycardia   . Dizziness   . Hypothyroid   . Depression   . Anxiety   . Meniere's disease    Current Outpatient Prescriptions  Medication Sig Dispense Refill  . chlorthalidone (HYGROTON) 25 MG tablet Take 25 mg by mouth daily.      Marland Kitchen HYDROcodone-homatropine (HYCODAN) 5-1.5 MG/5ML syrup Take 5 mLs by mouth every 6 (six) hours as needed for cough.  120 mL  0  . levothyroxine (SYNTHROID, LEVOTHROID) 88 MCG tablet Take 88 mcg by mouth daily before breakfast.      .  potassium chloride (K-DUR) 10 MEQ tablet Take 20 mEq by mouth daily.       . [DISCONTINUED] atenolol (TENORMIN) 100 MG tablet Take 100 mg by mouth 2 (two) times daily as needed. For  tremors, palpitations, anxiety.       No current facility-administered medications for this visit.   No family history on file. History   Social History  . Marital Status: Single    Spouse Name: N/A    Number of Children: N/A  . Years of Education: N/A   Social History Main Topics  . Smoking status: Never Smoker   . Smokeless tobacco: Never Used  . Alcohol Use: No  . Drug Use: No  . Sexual Activity: None   Other Topics Concern  . None   Social History Narrative   Uninsured.   Lives in [place].         Review of Systems: Review of Systems  Constitutional: Negative for fever, chills, weight loss and malaise/fatigue.  Eyes: Positive for discharge. Negative for blurred vision and redness.  Respiratory: Negative for cough and shortness of breath.   Cardiovascular: Negative for chest pain and leg swelling.  Gastrointestinal: Negative for heartburn, nausea, vomiting, abdominal pain, diarrhea, constipation and blood in stool.  Genitourinary: Negative for dysuria.  Neurological: Positive for tingling and weakness. Negative for dizziness,  focal weakness, loss of consciousness and headaches.    Objective:  Physical Exam: Filed Vitals:   09/23/13 1429  BP: 118/83  Pulse: 108  Temp: 97.4 F (36.3 C)  TempSrc: Oral  Height: 5\' 4"  (1.626 m)  Weight: 217 lb 12.8 oz (98.793 kg)  SpO2: 99%   General: alert, cooperative, NAD HEENT: NCAT, pupils equal round and reactive to light, vision grossly intact, oropharynx clear and non-erythematous  Neck: supple, no lymphadenopathy Lungs: clear to ascultation bilaterally, normal work of respiration, no wheezes, rales, ronchi Heart: regular rate and rhythm, no murmurs, gallops, or rubs Abdomen: soft, non-tender, non-distended, normal bowel  sounds Extremities: 2+ DP/PT pulses bilaterally, no cyanosis, clubbing, or edema Neurologic: alert & oriented X3, cranial nerves II-XII intact, strength grossly intact, sensation intact to light touch  Assessment & Plan:  Patient discussed with Dr. Daryll Drown.  Please see problem-based assessment and plan.

## 2013-09-23 NOTE — Assessment & Plan Note (Signed)
Refused flu shot

## 2013-09-23 NOTE — Patient Instructions (Addendum)
Please follow-up with Dr. Stann Mainland in 1-2 months (ideally end of February).  We are checking a blood test today to see if your blood counts and/or iron are low.  I will call you if the results are abnormal and if you will need iron supplementation.   We are prescribing you a birth control pill today that should also help with your heavy periods.  You should take this at the [facility] every day and try hard not to miss any doses.  After 1 month, please let us know how you are doing with it.   We have to wait several months to send you back to the eye doctor since you were not able to make your last appointment.  In the meantime, we are prescribing you an eye gel that you should use at night and also as needed throughout the day to help with dry eye.     Ethinyl Estradiol; Norethindrone Acetate tablets (contraception) What is this medicine? ETHINYL ESTRADIOL; NORETHINDRONE ACETATE (ETH in il es tra DYE ole; nor eth IN drone AS e tate) is an oral contraceptive. The products combine two types of female hormones, an estrogen and a progestin. They are used to prevent ovulation and pregnancy. This medicine may be used for other purposes; ask your health care provider or pharmacist if you have questions. COMMON BRAND NAME(S): Estrostep Fe, Gildess Fe 1.5/30, Gildess Fe 1/20, Gildess, Junel 1.5/30, Junel 1/20, Junel Fe 1.5/30, Junel Fe 1/20, Larin Fe, [place], Lo Loestrin Fe, Loestrin 1.5/30, Loestrin 1/20, Loestrin 24 Fe, Loestrin FE 1.5/30, Loestrin FE 1/20, Lomedia 24 Fe, Microgestin 1.5/30, Microgestin 1/20, Microgestin Fe 1.5/30, Microgestin Fe 1/20, Tilia Fe, Tri-Legest Fe What should I tell my health care provider before I take this medicine? They need to know if you have or ever had any of these conditions: -abnormal vaginal bleeding -blood vessel disease or blood clots -breast, cervical, endometrial, ovarian, liver, or uterine cancer -diabetes -gallbladder disease -heart disease or recent heart  attack -high blood pressure -high cholesterol -kidney disease -liver disease -migraine headaches -stroke -systemic lupus erythematosus (SLE) -tobacco smoker -an unusual or allergic reaction to estrogens, progestins, other medicines, foods, dyes, or preservatives -pregnant or trying to get pregnant -breast-feeding How should I use this medicine? Take this medicine by mouth. To reduce nausea, this medicine may be taken with food. Follow the directions on the prescription label. Take this medicine at the same time each day and in the order directed on the package. Do not take your medicine more often than directed. Contact your pediatrician regarding the use of this medicine in children. Special care may be needed. This medicine has been used in female children who have started having menstrual periods. A patient package insert for the product will be given with each prescription and refill. Read this sheet carefully each time. The sheet may change frequently. Overdosage: If you think you have taken too much of this medicine contact a poison control center or emergency room at once. NOTE: This medicine is only for you. Do not share this medicine with others. What if I miss a dose? If you miss a dose, refer to the patient information sheet you received with your medicine for direction. If you miss more than one pill, this medicine may not be as effective and you may need to use another form of birth control. What may interact with this medicine? -acetaminophen -antibiotics or medicines for infections, especially rifampin, rifabutin, rifapentine, and griseofulvin, and possibly penicillins or tetracyclines -aprepitant -  ascorbic acid (vitamin C) -atorvastatin -barbiturate medicines, such as phenobarbital -bosentan -carbamazepine -caffeine -clofibrate -cyclosporine -dantrolene -doxercalciferol -felbamate -grapefruit juice -hydrocortisone -medicines for anxiety or sleeping problems, such  as diazepam or temazepam -medicines for diabetes, including pioglitazone -mineral oil -modafinil -mycophenolate -nefazodone -oxcarbazepine -phenytoin -prednisolone -ritonavir or other medicines for HIV infection or AIDS -rosuvastatin -selegiline -soy isoflavones supplements -St. John's wort -tamoxifen or raloxifene -theophylline -thyroid hormones -topiramate -warfarin This list may not describe all possible interactions. Give your health care provider a list of all the medicines, herbs, non-prescription drugs, or dietary supplements you use. Also tell them if you smoke, drink alcohol, or use illegal drugs. Some items may interact with your medicine. What should I watch for while using this medicine? Visit your doctor or health care professional for regular checks on your progress. You will need a regular breast and pelvic exam and Pap smear while on this medicine. Use an additional method of contraception during the first cycle that you take these tablets. If you have any reason to think you are pregnant, stop taking this medicine right away and contact your doctor or health care professional. If you are taking this medicine for hormone related problems, it may take several cycles of use to see improvement in your condition. Smoking increases the risk of getting a blood clot or having a stroke while you are taking birth control pills, especially if you are more than 27 years old. You are strongly advised not to smoke. This medicine can make your body retain fluid, making your fingers, hands, or ankles swell. Your blood pressure can go up. Contact your doctor or health care professional if you feel you are retaining fluid. This medicine can make you more sensitive to the sun. Keep out of the sun. If you cannot avoid being in the sun, wear protective clothing and use sunscreen. Do not use sun lamps or tanning beds/booths. If you wear contact lenses and notice visual changes, or if the  lenses begin to feel uncomfortable, consult your eye care specialist. In some women, tenderness, swelling, or minor bleeding of the gums may occur. Notify your dentist if this happens. Brushing and flossing your teeth regularly may help limit this. See your dentist regularly and inform your dentist of the medicines you are taking. If you are going to have elective surgery, you may need to stop taking this medicine before the surgery. Consult your health care professional for advice. This medicine does not protect you against HIV infection (AIDS) or any other sexually transmitted diseases. What side effects may I notice from receiving this medicine? Side effects that you should report to your doctor or health care professional as soon as possible: -breast tissue changes or discharge -changes in vaginal bleeding during your period or between your periods -chest pain -coughing up blood -dizziness or fainting spells -headaches or migraines -leg, arm or groin pain -severe or sudden headaches -stomach pain (severe) -sudden shortness of breath -sudden loss of coordination, especially on one side of the body -speech problems -symptoms of vaginal infection like itching, irritation or unusual discharge -tenderness in the upper abdomen -vomiting -weakness or numbness in the arms or legs, especially on one side of the body -yellowing of the eyes or skin Side effects that usually do not require medical attention (report to your doctor or health care professional if they continue or are bothersome): -breakthrough bleeding and spotting that continues beyond the 3 initial cycles of pills -breast tenderness -mood changes, anxiety, depression, frustration, anger, or emotional  outbursts -increased sensitivity to sun or ultraviolet light -nausea -skin rash, acne, or brown spots on the skin -weight gain (slight) This list may not describe all possible side effects. Call your doctor for medical advice about  side effects. You may report side effects to FDA at 1-800-FDA-1088. Where should I keep my medicine? Keep out of the reach of children. Store at room temperature between 15 and 30 degrees C (59 and 86 degrees F). Throw away any unused medicine after the expiration date. NOTE: This sheet is a summary. It may not cover all possible information. If you have questions about this medicine, talk to your doctor, pharmacist, or health care provider.  2014, Elsevier/Gold Standard. (2012-12-31 15:35:20)   Keratoconjunctivitis Sicca Keratoconjunctivitis sicca (dry eye) is dryness of the membranes surrounding the eye. It is caused by inadequate production of natural tears. The eyes must remain lubricated at all times. This creates a smooth surface of the cornea (clear covering at the front of the eye), which allows the eyelids to slide over the eye, without causing soreness and irritation. A small amount of tears are constantly produced by the tear glands (lacrimal glands). These glands are located under the outside part of the upper eyelids. Dry eyes are often caused by decreased tear production. This condition is called aqueous tear-deficient dry eyes. The tear glands do not produce enough tears to keep the tissues surrounding the eye moist. This condition is common in postmenopausal women. Dry eyes may also be caused by an abnormality of how the tears are made. This can result in faster evaporation of the tears. It is called evaporative dry eyes. Although the tear glands produce enough tears, the rate of evaporation is so fast that the entire eye surface cannot be kept covered with a complete layer of tears. The condition can occur during certain activities or in unusually dry surroundings.  Sometimes dry eyes are symptoms of other diseases that can affect the eyes. Some examples are:   Rheumatoid arthritis.  Systemic lupus erythematosus (lupus). Chronic inflammatory disease, causing the body to attack its  own tissue.  Sjgren's syndrome. Dryness of the eyes and mouth, sometimes associated with a form of rheumatoid arthritis or lupus. SYMPTOMS  Symptoms of dry eyes include:  Irritation.  Itching.  Redness.  Burning and feeling as though there is something gritty in the eye. If the surface of the eye becomes damaged, sensitivity may increase, along with increased discomfort and sensitivity to bright light. Symptoms are made worse by activities with decreased blinking, such as staring at a television, book, or computer screen.  Symptoms are also worse in dusty or smoky areas and in dry environments. Certain drugs can make symptoms worse, including antihistamines, tranquilizers, diuretics, anti-hypertensives (blood pressure medicine), and oral contraceptives. Symptoms tend to improve during cool, rainy, or foggy weather and in humid places, such as in the shower. DIAGNOSIS  Dry eyes are usually diagnosed by symptoms alone. Sometimes a Schirmer test is done, in which a strip of filter paper is placed at the edge of the eyelid. The amount of moisture bathing the eye is measured. This test is done by measuring how far the tears go up a strip of paper applied to the eye, in a set amount of time. Your caregiver will use a special microscope to examine the surface of the eye and the cornea, to see if there are signs of dryness. TREATMENT  Artificial tears (eye drops made with substances that simulate real tears) applied every few  hours can generally control the problem. Lubricating ointments may help more severe cases. Avoiding dry, drafty environments and using humidifiers may also help. Minor surgery can be done to block the flow of tears into the nose, so that more tears are available to bathe the eyes. Patients with evaporative dry eyes may also benefit from treatment of the inflammation (redness and soreness) of the eyelids, which often occurs with the dry eyes. This treatment may include warm  compresses, eyelid margin scrubs, or oral medicines. PROGNOSIS  Even with severe dry eyes, it is uncommon to lose vision. At times, it may become difficult to see. Rarely, scarring and ulcers may occur, and blood vessels can grow across the cornea. Scarring and blood vessel growth can impair vision. In severe cases, the cornea may become damaged or infected. Ulcers or infections of the cornea are serious complications. PREVENTION  There is no way to prevent getting keratoconjunctivitis sicca. However, complications can be prevented by keeping the eyes as moist as possible with artificial drops, as needed. SEEK IMMEDIATE MEDICAL CARE IF:   Your eye pain gets worse.  You develop a pus-like drainage from the eye.  The eye drops or medicines prescribed by your caregiver are not helping.  You have dry eyes and a sudden increase in discomfort or redness.  You have a sudden decrease in vision.  You have any other questions or concerns. Document Released: 07/12/2004 Document Revised: 11/17/2011 Document Reviewed: 07/16/2009 [facility] Patient Information 2014 [place], [place].

## 2013-09-23 NOTE — Assessment & Plan Note (Signed)
Continue Synthroid 100 mcg daily.  No symptoms of hyperthyroidism reported.  Check TSH later this year.

## 2013-09-23 NOTE — Assessment & Plan Note (Signed)
Patient reporting symptoms of dry eye.  She has been using Systane drops with only temporary relief.  She was prescribed Genteal gel at last visit but never got prescription.  Would like to try this again today.  She was referred to ophthalmology but missed her appointment.  Explained to her that we will not be able to refer her again until 01/2014.   -rx sent for Genteal gel

## 2013-09-24 ENCOUNTER — Other Ambulatory Visit: Payer: Self-pay | Admitting: Internal Medicine

## 2013-09-24 LAB — ANEMIA PANEL
%SAT: 5 % — AB (ref 20–55)
ABS RETIC: 67.6 10*3/uL (ref 19.0–186.0)
FERRITIN: 8 ng/mL — AB (ref 10–291)
FOLATE: 9.6 ng/mL
Iron: 22 ug/dL — ABNORMAL LOW (ref 42–145)
RBC.: 4.83 MIL/uL (ref 3.87–5.11)
Retic Ct Pct: 1.4 % (ref 0.4–2.3)
TIBC: 448 ug/dL (ref 250–470)
UIBC: 426 ug/dL — ABNORMAL HIGH (ref 125–400)
VITAMIN B 12: 885 pg/mL (ref 211–911)

## 2013-09-24 MED ORDER — FERROUS SULFATE 325 (65 FE) MG PO TABS: 325.0000 mg | ORAL_TABLET | Freq: Every day | ORAL | Status: DC

## 2013-09-24 NOTE — Progress Notes (Signed)
Spoke to patient on phone about her lab results.  Informed her that I have called in iron supplementation.  She will pick up at her pharmacy today and start taking.

## 2013-09-28 NOTE — Progress Notes (Signed)
Case discussed with Dr. Rogers at the time of the visit.  We reviewed the resident's history and exam and pertinent patient test results.  I agree with the assessment, diagnosis, and plan of care documented in the resident's note. 

## 2013-10-11 ENCOUNTER — Ambulatory Visit (INDEPENDENT_AMBULATORY_CARE_PROVIDER_SITE_OTHER): Payer: Self-pay | Admitting: Internal Medicine

## 2013-10-11 ENCOUNTER — Encounter: Payer: Self-pay | Admitting: Internal Medicine

## 2013-10-11 VITALS — BP 141/100 | HR 105 | Temp 98.7°F | Wt 217.2 lb

## 2013-10-11 DIAGNOSIS — I10 Essential (primary) hypertension: Secondary | ICD-10-CM | POA: Insufficient documentation

## 2013-10-11 DIAGNOSIS — N949 Unspecified condition associated with female genital organs and menstrual cycle: Secondary | ICD-10-CM

## 2013-10-11 DIAGNOSIS — H04129 Dry eye syndrome of unspecified lacrimal gland: Secondary | ICD-10-CM

## 2013-10-11 DIAGNOSIS — R21 Rash and other nonspecific skin eruption: Secondary | ICD-10-CM | POA: Insufficient documentation

## 2013-10-11 DIAGNOSIS — K5900 Constipation, unspecified: Secondary | ICD-10-CM

## 2013-10-11 DIAGNOSIS — N938 Other specified abnormal uterine and vaginal bleeding: Secondary | ICD-10-CM

## 2013-10-11 DIAGNOSIS — D649 Anemia, unspecified: Secondary | ICD-10-CM

## 2013-10-11 DIAGNOSIS — L259 Unspecified contact dermatitis, unspecified cause: Secondary | ICD-10-CM

## 2013-10-11 DIAGNOSIS — L239 Allergic contact dermatitis, unspecified cause: Secondary | ICD-10-CM

## 2013-10-11 DIAGNOSIS — H04123 Dry eye syndrome of bilateral lacrimal glands: Secondary | ICD-10-CM

## 2013-10-11 DIAGNOSIS — N925 Other specified irregular menstruation: Secondary | ICD-10-CM

## 2013-10-11 DIAGNOSIS — Z0000 Encounter for general adult medical examination without abnormal findings: Secondary | ICD-10-CM

## 2013-10-11 MED ORDER — TRIAMCINOLONE ACETONIDE 0.1 % EX CREA: 1.0000 "application " | TOPICAL_CREAM | Freq: Two times a day (BID) | CUTANEOUS | Status: DC

## 2013-10-11 NOTE — Progress Notes (Addendum)
[name], female   DOB: 1986-08-17, 28 y.o.   MRN: [id number]   Subjective:   [name]   DOB: 05/25/1986 28 y.o.   MRN: [id number]  HPI: Alyssa Hall is a 28 y.o. woman with history of HTN, hypothyroidism, Meniere's disease, DUB who presents for skin rash.   Patient states she started noticing rash in early January but forgot to mention it at last visit and now it is getting worse.  She has 4 troubling spots, one in her elbow crease on each arm, one on her chest, and one on her right buttock.  She states the areas are somewhat itchy, not red or draining.  She has not had fever at home.    Patient states she changed her soap in late December and changed her laundry detergent in January.  She has noticed worsening of her skin lesions especially since changing her laundry detergent.  She is not sure of the names of the soap or laundry detergent she is currently using.    She is doing well on OCPs and iron supplementation we recently prescribed.  No constipation.  She also reports improvement of her dry eye with using Genteal gel.    Past Medical History  Diagnosis Date  . Thyrotoxicosis   . Atrial tachycardia   . Dizziness   . Hypothyroid   . Depression   . Anxiety   . Meniere's disease    Current Outpatient Prescriptions  Medication Sig Dispense Refill  . Carboxymethylcell-Hypromellose 0.25-0.3 % GEL Apply 1 drop to eye 3 (three) times daily as needed.  15 mL  0  . chlorthalidone (HYGROTON) 25 MG tablet Take 25 mg by mouth daily.      . ferrous sulfate 325 (65 FE) MG tablet Take 1 tablet (325 mg total) by mouth daily.  30 tablet  3  . levothyroxine (SYNTHROID, LEVOTHROID) 88 MCG tablet Take 88 mcg by mouth daily before breakfast.      . Norethindrone-Ethinyl Estradiol-Fe Biphas (LO LOESTRIN FE) 1 MG-10 MCG / 10 MCG tablet Take 1 tablet by mouth daily.  1 Package  1  . potassium chloride (K-DUR) 10 MEQ tablet Take 20 mEq by mouth daily.         . [DISCONTINUED] atenolol (TENORMIN) 100 MG tablet Take 100 mg by mouth 2 (two) times daily as needed. For  tremors, palpitations, anxiety.       No current facility-administered medications for this visit.   No family history on file. History   Social History  . Marital Status: Single    Spouse Name: N/A    Number of Children: N/A  . Years of Education: N/A   Social History Main Topics  . Smoking status: Never Smoker   . Smokeless tobacco: Never Used  . Alcohol Use: No  . Drug Use: No  . Sexual Activity: Not on file   Other Topics Concern  . Not on file   Social History Narrative   Uninsured.   Lives in [place].         Review of Systems: Review of Systems  Constitutional: Negative for fever, chills and malaise/fatigue.  Eyes: Negative for blurred vision.  Respiratory: Negative for cough and shortness of breath.   Cardiovascular: Negative for chest pain and palpitations.  Gastrointestinal: Negative for nausea, vomiting, abdominal pain, diarrhea, constipation and blood in stool.  Genitourinary: Negative for dysuria.  Musculoskeletal: Negative for falls.  Skin: Positive for rash.  Neurological: Negative for dizziness, loss  of consciousness, weakness and headaches.    Objective:  Physical Exam: Filed Vitals:   10/11/13 1525  BP: 141/100  Pulse: 105  Temp: 98.7 F (37.1 C)  TempSrc: Oral  Weight: 217 lb 3.2 oz (98.521 kg)  SpO2: 99%   General: alert, cooperative, and in no apparent distress HEENT: NCAT, vision grossly intact, oropharynx clear and non-erythematous  Neck: supple, no lymphadenopathy Lungs: clear to ascultation bilaterally, normal work of respiration, no wheezes, rales, ronchi Heart: regular rate and rhythm, no murmurs, gallops, or rubs Abdomen: soft, non-tender, non-distended, normal bowel sounds Extremities: 2+ DP/PT pulses bilaterally, no cyanosis, clubbing, or edema Neurologic: alert & oriented X3, cranial nerves II-XII intact, strength  grossly intact, sensation intact to light touch Skin: 1-2 cm areas of hyperpigmented plaques on each forearm flexor surface, in between breasts, and on upper right buttocks; no signs of infection  Assessment & Plan:  Patient discussed with Dr. Murlean Caller.  Please see problem-based assessment and plan.

## 2013-10-11 NOTE — Assessment & Plan Note (Signed)
Patient has not been constipated even on iron supplementation for past 2 weeks.

## 2013-10-11 NOTE — Assessment & Plan Note (Signed)
Anemia panel at last visit showed Fe 22 (low), ferritin 8 (low); B12, folate, retic count within normal limits.  Started patient on iron supplementation which she has been taking without problems.

## 2013-10-11 NOTE — Assessment & Plan Note (Signed)
Patient recently changed soap and laundry detergent.  She is unsure of the names of either of the ones she is currently using.  On exam, one hyperpigmented somewhat itchy plaque in each of the forearm flexor surfaces as well as one lesion on upper right buttocks.    Prescribed TRIAMCINOLONE 0.1% cream to affected areas 2-3 times daily for up to 2 weeks.  Instructed patient to decrease to once daily if she sees improvement and to let us know if cream worsens lesions.  Also advised her to go back to using the soap and laundry detergents that were not irritating her skin, and if she can't remember the names of them to go with [employer] and Dreft laundry detergent.

## 2013-10-11 NOTE — Assessment & Plan Note (Signed)
Patient doing well thus far on OCPs.

## 2013-10-11 NOTE — Assessment & Plan Note (Signed)
BP Readings from Last 3 Encounters:  10/11/13 141/100  09/23/13 118/83  09/22/13 134/86    Lab Results  Component Value Date   NA 136 05/27/2013   K 3.5 05/27/2013   CREATININE 0.73 05/27/2013    Assessment: Blood pressure control:  slightly elevated Progress toward BP goal:   deteriorated Comments: BP likely elevated due to anxiety over skin rash today.    Plan: Medications:  continue current medications Self management tools provided:  BP log  Other plans: Follow-up in 1 month.

## 2013-10-11 NOTE — Patient Instructions (Signed)
Please follow-up with Dr. Stann Mainland in 1 month.   Keep up the good work taking your medicines as prescribed!  We are prescribing you a steroid cream called TRIAMCINOLONE today, it should be waiting on you at [facility].  Be sure to ask for the [employer] $4 list price match in order to get this medicine for as cheap as possible.  You may apply this cream to affected areas 2-3 times daily for up to 2 weeks.  You can go down to once daily if you see improvement.  Be sure to let us know if this cream makes the areas worse.   You should also go back to using the soap and laundry detergent you had been using that did not irritate your skin.  Dove soap and Dreft laundry detergent are always good choices for sensitive skin.    We are giving you a blood pressure log today.  If you can check your own blood pressure every couple of days and record it in the log then bring it to your next visit, that would be helpful!    Eczema Eczema, also called atopic dermatitis, is a skin disorder that causes inflammation of the skin. It causes a red rash and dry, scaly skin. The skin becomes very itchy. Eczema is generally worse during the cooler winter months and often improves with the warmth of summer. Eczema usually starts showing signs in infancy. Some children outgrow eczema, but it may last through adulthood.  CAUSES  The exact cause of eczema is not known, but it appears to run in families. People with eczema often have a family history of eczema, allergies, asthma, or hay fever. Eczema is not contagious. Flare-ups of the condition may be caused by:   Contact with something you are sensitive or allergic to.   Stress. SIGNS AND SYMPTOMS  Dry, scaly skin.   Red, itchy rash.   Itchiness. This may occur before the skin rash and may be very intense.  DIAGNOSIS  The diagnosis of eczema is usually made based on symptoms and medical history. TREATMENT  Eczema cannot be cured, but symptoms usually can be  controlled with treatment and other strategies. A treatment plan might include:  Controlling the itching and scratching.   Use over-the-counter antihistamines as directed for itching. This is especially useful at night when the itching tends to be worse.   Use over-the-counter steroid creams as directed for itching.   Avoid scratching. Scratching makes the rash and itching worse. It may also result in a skin infection (impetigo) due to a break in the skin caused by scratching.   Keeping the skin well moisturized with creams every day. This will seal in moisture and help prevent dryness. Lotions that contain alcohol and water should be avoided because they can dry the skin.   Limiting exposure to things that you are sensitive or allergic to (allergens).   Recognizing situations that cause stress.   Developing a plan to manage stress.  HOME CARE INSTRUCTIONS   Only take over-the-counter or prescription medicines as directed by your health care provider.   Do not use anything on the skin without checking with your health care provider.   Keep baths or showers short (5 minutes) in warm (not hot) water. Use mild cleansers for bathing. These should be unscented. You may add nonperfumed bath oil to the bath water. It is best to avoid soap and bubble bath.   Immediately after a bath or shower, when the skin is still damp,  apply a moisturizing ointment to the entire body. This ointment should be a petroleum ointment. This will seal in moisture and help prevent dryness. The thicker the ointment, the better. These should be unscented.   Keep fingernails cut short. Children with eczema may need to wear soft gloves or mittens at night after applying an ointment.   Dress in clothes made of cotton or cotton blends. Dress lightly, because heat increases itching.   A child with eczema should stay away from anyone with fever blisters or cold sores. The virus that causes fever blisters  (herpes simplex) can cause a serious skin infection in children with eczema. SEEK MEDICAL CARE IF:   Your itching interferes with sleep.   Your rash gets worse or is not better within 1 week after starting treatment.   You see pus or soft yellow scabs in the rash area.   You have a fever.   You have a rash flare-up after contact with someone who has fever blisters.  Document Released: 08/22/2000 Document Revised: 06/15/2013 Document Reviewed: 03/28/2013 [facility] Patient Information 2014 [place].

## 2013-10-11 NOTE — Assessment & Plan Note (Signed)
Declined flu shot again today.

## 2013-10-11 NOTE — Assessment & Plan Note (Signed)
Improved with Genteal gel.

## 2013-10-12 ENCOUNTER — Ambulatory Visit: Payer: Self-pay

## 2013-10-12 NOTE — Progress Notes (Signed)
INTERNAL MEDICINE TEACHING ATTENDING ADDENDUM - Dru Laurel, DO: I reviewed and discussed at the time of visit with the resident Dr. Rogers, the patient's medical history, physical examination, diagnosis and results of tests and treatment and I agree with the patient's care as documented.  

## 2013-10-14 ENCOUNTER — Ambulatory Visit: Payer: Self-pay

## 2013-10-25 ENCOUNTER — Ambulatory Visit: Payer: Self-pay | Admitting: Internal Medicine

## 2013-10-26 ENCOUNTER — Ambulatory Visit: Payer: Self-pay

## 2013-11-03 ENCOUNTER — Ambulatory Visit: Payer: Self-pay | Admitting: Internal Medicine

## 2013-11-03 ENCOUNTER — Ambulatory Visit: Payer: Self-pay

## 2013-11-11 ENCOUNTER — Ambulatory Visit: Payer: Self-pay

## 2013-11-17 ENCOUNTER — Ambulatory Visit (INDEPENDENT_AMBULATORY_CARE_PROVIDER_SITE_OTHER): Payer: Self-pay | Admitting: Internal Medicine

## 2013-11-17 ENCOUNTER — Encounter: Payer: Self-pay | Admitting: Internal Medicine

## 2013-11-17 VITALS — BP 117/76 | HR 100 | Temp 97.1°F | Ht 63.0 in | Wt 218.1 lb

## 2013-11-17 DIAGNOSIS — I10 Essential (primary) hypertension: Secondary | ICD-10-CM

## 2013-11-17 DIAGNOSIS — R21 Rash and other nonspecific skin eruption: Secondary | ICD-10-CM

## 2013-11-17 MED ORDER — HYDROCHLOROTHIAZIDE 25 MG PO TABS: 25.0000 mg | ORAL_TABLET | Freq: Every day | ORAL | Status: DC

## 2013-11-17 MED ORDER — HYDROXYZINE HCL 25 MG PO TABS: 25.0000 mg | ORAL_TABLET | Freq: Three times a day (TID) | ORAL | Status: DC | PRN

## 2013-11-17 NOTE — Assessment & Plan Note (Addendum)
BP Readings from Last 3 Encounters:  11/17/13 117/76  10/11/13 141/100  09/23/13 118/83    Lab Results  Component Value Date   NA 136 05/27/2013   K 3.5 05/27/2013   CREATININE 0.73 05/27/2013    Assessment: Blood pressure control:   Progress toward BP goal:    Comments:   Plan: Medications:  Cont diuretic but will change to hctz Educational resources provided:   Self management tools provided:   Other plans: start hctz 25 mg qd as chlorthalidone has been associated with scaly eczema rarely

## 2013-11-17 NOTE — Progress Notes (Signed)
   Subjective:    [name], female    DOB: [birth year], 28 y.o.   MRN: [id number]  HPI  Pt presents for recheck of rash.  Her hx is significant for hypothyroidism, hypertension, and recent rash which began over a month ago as several plaques located on her antecubital fossa, right breast, and left buttock.  She was treated for likely eczema with triamcinolone cream.  Pt states rash has worsened and now with severe pruritis.  Has been using olive oil as a moisturizer which also makes it worse.  Two sisters with remote hx of skin rashes.  Last TSH within normal limits, bp well controlled today on chlorthalidone 25 mg.  Review of Systems  Constitutional: Negative for fever and fatigue.  HENT: Negative.   Eyes: Negative.   Respiratory: Negative for shortness of breath.   Cardiovascular: Negative for chest pain, palpitations and leg swelling.  Gastrointestinal: Negative.   Endocrine: Negative for polyuria.  Genitourinary: Negative for dysuria.  Musculoskeletal: Negative.   Skin: Positive for rash.  Allergic/Immunologic: Negative for environmental allergies, food allergies and immunocompromised state.  Neurological: Negative for dizziness and numbness.  Hematological: Negative.   Psychiatric/Behavioral: Negative.        Objective:   Physical Exam  Constitutional: She is oriented to person, place, and time. She appears well-developed and well-nourished. She appears distressed.  African female, mild distress from pruritis  HENT:  Head: Normocephalic and atraumatic.  Eyes: Conjunctivae and EOM are normal. Pupils are equal, round, and reactive to light.  Neck: Normal range of motion. Neck supple. No thyromegaly present.  Cardiovascular: Normal rate, regular rhythm and normal heart sounds.   Mildly tachycardic  Pulmonary/Chest: Effort normal and breath sounds normal.  Abdominal: Soft. Bowel sounds are normal.  Musculoskeletal: She exhibits edema. She exhibits no tenderness.   Neurological: She is alert and oriented to person, place, and time.  Skin: Skin is warm and dry. Rash noted. Rash is macular and urticarial. Rash is not maculopapular, not pustular and not vesicular. No erythema.     Rash has a somewhat scaly component but difficult to determine if a silver sheen is present which would be consistent with psoriatic lesions vs eczematous vs fungal  Psychiatric: She has a normal mood and affect.          Assessment & Plan:  See separate problem list charting:

## 2013-11-17 NOTE — Assessment & Plan Note (Addendum)
Worsened in degree of pruritis and distribution since initial evaluation a month ago despite triamcinolone cream.  Covers near entire back, buttocks, inner thighs, a few plaques on antecubital fossa, and post patellar bilaterally.  Unclear if this is eczematous vs psoriatic vs fungal in nature.   -Rx hydroxyzine 25 mg qhs. 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 -appt with [facility] Mar 17th -appt with [facility] May 2015

## 2013-11-17 NOTE — Patient Instructions (Addendum)
  General Instructions:  We have referred you to a skin specialist for your rash. Please keep your appointment on March 17th. Take the anti-itch pill as tolerated.  You may start at bed time.  It may be taken up to every 8 hours. There are rare cases when chlorthalidone can cause eczema so we have changed your blood pressure medicine from chlorthalidone to hydrochlorathiazide (HCTZ) 25 mg daily.  Follow-up with your Primary Doctor in 1 month.  Please bring your medicines with you each time you come.   Medicines may be:  Eye drops  Herbal   Vitamins  Pills  Seeing these help [place] take care of you.  Treatment Goals:  Goals (1 Years of Data) as of 11/17/13         As of Today 10/11/13 09/23/13 09/22/13 09/10/13     Blood Pressure    . Blood Pressure < 140/90  117/76 141/100 118/83 134/86 116/69      Progress Toward Treatment Goals:  No flowsheet data found.  Self Care Goals & Plans:  No flowsheet data found.  No flowsheet data found.   Care Management & Community Referrals:  No flowsheet data found.

## 2013-11-21 NOTE — Progress Notes (Signed)
Case discussed with Dr. Schooler at the time of the visit.  We reviewed the resident's history and exam and pertinent patient test results.  I agree with the assessment, diagnosis, and plan of care documented in the resident's note.     

## 2013-12-05 ENCOUNTER — Ambulatory Visit: Payer: Self-pay

## 2013-12-20 ENCOUNTER — Ambulatory Visit: Payer: No Typology Code available for payment source

## 2013-12-27 ENCOUNTER — Other Ambulatory Visit: Payer: Self-pay | Admitting: Physician Assistant

## 2014-02-13 ENCOUNTER — Emergency Department (HOSPITAL_COMMUNITY)
Admission: EM | Admit: 2014-02-13 | Discharge: 2014-02-13 | Disposition: A | Discharge status: Home / Self Care | Payer: No Typology Code available for payment source | Attending: Emergency Medicine | Admitting: Emergency Medicine

## 2014-02-13 ENCOUNTER — Encounter (HOSPITAL_COMMUNITY): Payer: Self-pay | Admitting: Emergency Medicine

## 2014-02-13 DIAGNOSIS — N898 Other specified noninflammatory disorders of vagina: Secondary | ICD-10-CM | POA: Insufficient documentation

## 2014-02-13 DIAGNOSIS — Z8669 Personal history of other diseases of the nervous system and sense organs: Secondary | ICD-10-CM | POA: Insufficient documentation

## 2014-02-13 DIAGNOSIS — E039 Hypothyroidism, unspecified: Secondary | ICD-10-CM | POA: Insufficient documentation

## 2014-02-13 DIAGNOSIS — R42 Dizziness and giddiness: Secondary | ICD-10-CM | POA: Insufficient documentation

## 2014-02-13 DIAGNOSIS — F3289 Other specified depressive episodes: Secondary | ICD-10-CM | POA: Insufficient documentation

## 2014-02-13 DIAGNOSIS — N939 Abnormal uterine and vaginal bleeding, unspecified: Secondary | ICD-10-CM

## 2014-02-13 DIAGNOSIS — E0590 Thyrotoxicosis, unspecified without thyrotoxic crisis or storm: Secondary | ICD-10-CM | POA: Insufficient documentation

## 2014-02-13 DIAGNOSIS — F411 Generalized anxiety disorder: Secondary | ICD-10-CM | POA: Insufficient documentation

## 2014-02-13 DIAGNOSIS — Z79899 Other long term (current) drug therapy: Secondary | ICD-10-CM | POA: Insufficient documentation

## 2014-02-13 DIAGNOSIS — F329 Major depressive disorder, single episode, unspecified: Secondary | ICD-10-CM | POA: Insufficient documentation

## 2014-02-13 LAB — CBC
HEMATOCRIT: 39.7 % (ref 36.0–46.0)
Hemoglobin: 13.2 g/dL (ref 12.0–15.0)
MCH: 26.9 pg (ref 26.0–34.0)
MCHC: 33.2 g/dL (ref 30.0–36.0)
MCV: 80.9 fL (ref 78.0–100.0)
PLATELETS: 384 10*3/uL (ref 150–400)
RBC: 4.91 MIL/uL (ref 3.87–5.11)
RDW: 14.3 % (ref 11.5–15.5)
WBC: 6 10*3/uL (ref 4.0–10.5)

## 2014-02-13 LAB — PREGNANCY, URINE: PREG TEST UR: NEGATIVE

## 2014-02-13 MED ORDER — ESTROGENS CONJUGATED 1.25 MG PO TABS: 2.5000 mg | ORAL_TABLET | Freq: Every day | ORAL | Status: DC

## 2014-02-13 MED ORDER — ESTROGENS CONJUGATED 1.25 MG PO TABS
2.5000 mg | ORAL_TABLET | Freq: Every day | ORAL | Status: DC
  Administered 2014-02-13: 2.5 mg via ORAL
  Filled 2014-02-13: qty 2

## 2014-02-13 NOTE — ED Notes (Addendum)
Patient reports she has been bleeding for days. History of fibroids.

## 2014-02-13 NOTE — ED Notes (Signed)
Pt reports menstrual cycle since 5/22, bleeding has slowed down but is still present. Reports weakness, dizziness. Denies pain.

## 2014-02-13 NOTE — ED Notes (Signed)
Called pharmacy to have estrogen med sent down so patient may discharge.

## 2014-02-13 NOTE — ED Provider Notes (Signed)
CSN: [id number]     Arrival date & time 02/13/14  1604 History   First MD Initiated Contact with Patient 02/13/14 2023     Chief Complaint  Patient presents with  . Vaginal Bleeding     (Consider location/radiation/quality/duration/timing/severity/associated sxs/prior Treatment) Patient is a 28 y.o. female presenting with vaginal bleeding. The history is provided by the patient.  Vaginal Bleeding Quality:  Bright red and clots Severity:  Mild Onset quality:  Gradual Duration:  2 weeks Timing:  Constant Progression:  Improving Chronicity:  New Menstrual history:  Irregular Possible pregnancy: no   Context: spontaneously   Context: not after intercourse   Relieved by:  Nothing Worsened by:  Nothing tried Associated symptoms: dizziness   Associated symptoms: no abdominal pain, no dysuria, no fever, no nausea and no vaginal discharge     Past Medical History  Diagnosis Date  . Thyrotoxicosis   . Atrial tachycardia   . Dizziness   . Hypothyroid   . Depression   . Anxiety   . Meniere's disease    History reviewed. No pertinent past surgical history. No family history on file. History  Substance Use Topics  . Smoking status: Never Smoker   . Smokeless tobacco: Never Used  . Alcohol Use: No   OB History   Grav Para Term Preterm Abortions TAB SAB Ect Mult Living   1     0         Review of Systems  Constitutional: Negative for fever.  Gastrointestinal: Negative for nausea and abdominal pain.  Genitourinary: Positive for vaginal bleeding. Negative for dysuria and vaginal discharge.  Neurological: Positive for dizziness.  All other systems reviewed and are negative.     Allergies  Review of patient's allergies indicates no known allergies.  Home Medications   Prior to Admission medications   Medication Sig Start Date End Date Taking? Authorizing Provider  Carboxymethylcell-Hypromellose 0.25-0.3 % GEL Apply 1 drop to eye 3 (three) times daily as needed. 09/23/13    Ivin Poot, MD  ferrous sulfate 325 (65 FE) MG tablet Take 1 tablet (325 mg total) by mouth daily. 09/24/13 09/24/14  Ivin Poot, MD  hydrochlorothiazide (HYDRODIURIL) 25 MG tablet Take 1 tablet (25 mg total) by mouth daily. 11/17/13   Jeralene Huff, MD  hydrOXYzine (ATARAX/VISTARIL) 25 MG tablet Take 1 tablet (25 mg total) by mouth 3 (three) times daily as needed for itching. 11/17/13   Jeralene Huff, MD  levothyroxine (SYNTHROID, LEVOTHROID) 88 MCG tablet Take 88 mcg by mouth daily before breakfast.    Historical Provider, MD  Norethindrone-Ethinyl Estradiol-Fe Biphas (LO LOESTRIN FE) 1 MG-10 MCG / 10 MCG tablet Take 1 tablet by mouth daily. 09/23/13   Ivin Poot, MD  potassium chloride (K-DUR) 10 MEQ tablet Take 20 mEq by mouth daily.     Historical Provider, MD  triamcinolone cream (KENALOG) 0.1 % Apply 1 application topically 2 (two) times daily. 10/11/13   Ivin Poot, MD   BP 129/83  Pulse 95  Temp(Src) 98.4 F (36.9 C) (Oral)  Resp 16  Wt 216 lb 3 oz (98.062 kg)  SpO2 99%  LMP 01/27/2014 Physical Exam  Nursing note and vitals reviewed. Constitutional: She is oriented to person, place, and time. She appears well-developed and well-nourished. No distress.  HENT:  Head: Normocephalic and atraumatic.  Eyes: EOM are normal. Pupils are equal, round, and reactive to light.  Neck: Normal range of motion. Neck supple.  Cardiovascular: Normal rate and regular rhythm.  Exam  reveals no friction rub.   No murmur heard. Pulmonary/Chest: Effort normal and breath sounds normal. No respiratory distress. She has no wheezes. She has no rales.  Abdominal: Soft. She exhibits no distension. There is no tenderness. There is no rebound.  Genitourinary: Cervix exhibits no motion tenderness, no discharge and no friability. Right adnexum displays no mass, no tenderness and no fullness. Left adnexum displays no mass, no tenderness and no fullness.  Mild vaginal bleeding, os closed.   Musculoskeletal: Normal range of motion. She exhibits no edema.  Neurological: She is alert and oriented to person, place, and time.  Skin: She is not diaphoretic.    ED Course  Procedures (including critical care time) Labs Review Labs Reviewed  PREGNANCY, URINE  CBC    Imaging Review No results found.   EKG Interpretation None      MDM   Final diagnoses:  Vaginal bleeding    27F here with vaginal bleeding. LMP 5/22, continued since then. Bleeding slowing down, but still happening. Feel dizzy/lightheaded - normal for her period. No syncope or falls. No SOB or CP. Here vitals stable. Normal Hgb. Pelvic with mild bleeding, otherwise normal. Will place on Premarin, first dose given here.    Osvaldo Shipper, MD 02/13/14 2111

## 2014-02-13 NOTE — ED Notes (Signed)
Dr. Mingo Amber and pelvic cart at the bedside.

## 2014-02-13 NOTE — Discharge Instructions (Signed)
Abnormal Uterine Bleeding Abnormal uterine bleeding means bleeding from the vagina that is not your normal menstrual period. This can be:  Bleeding or spotting between periods.  Bleeding after sex (sexual intercourse).  Bleeding that is heavier or more than normal.  Periods that last longer than usual.  Bleeding after menopause. There are many problems that may cause this. Treatment will depend on the cause of the bleeding. Any kind of bleeding that is not normal should be reviewed by your doctor.  HOME CARE Watch your condition for any changes. These actions may lessen any discomfort you are having:  Do not use tampons or douches as told by your doctor.  Change your pads often. You should get regular pelvic exams and Pap tests. Keep all appointments for tests as told by your doctor. GET HELP IF:  You are bleeding for more than 1 week.  You feel dizzy at times. GET HELP RIGHT AWAY IF:   You pass out.  You have to change pads every 15 to 30 minutes.  You have belly pain.  You have a fever.  You become sweaty or weak.  You are passing large blood clots from the vagina.  You feel sick to your stomach (nauseous) and throw up (vomit). MAKE SURE YOU:  Understand these instructions.  Will watch your condition.  Will get help right away if you are not doing well or get worse. Document Released: 06/22/2009 Document Revised: 06/15/2013 Document Reviewed: 03/24/2013 [facility] Patient Information 2014 [place], [place].

## 2014-02-15 ENCOUNTER — Ambulatory Visit: Payer: No Typology Code available for payment source | Admitting: Internal Medicine

## 2014-02-15 ENCOUNTER — Encounter: Payer: Self-pay | Admitting: Internal Medicine

## 2014-03-02 ENCOUNTER — Ambulatory Visit (INDEPENDENT_AMBULATORY_CARE_PROVIDER_SITE_OTHER): Payer: No Typology Code available for payment source | Admitting: Internal Medicine

## 2014-03-02 ENCOUNTER — Encounter: Payer: Self-pay | Admitting: Internal Medicine

## 2014-03-02 VITALS — BP 114/79 | HR 109 | Temp 97.4°F | Ht 63.0 in | Wt 218.6 lb

## 2014-03-02 DIAGNOSIS — E876 Hypokalemia: Secondary | ICD-10-CM

## 2014-03-02 DIAGNOSIS — E039 Hypothyroidism, unspecified: Secondary | ICD-10-CM

## 2014-03-02 DIAGNOSIS — R109 Unspecified abdominal pain: Secondary | ICD-10-CM

## 2014-03-02 DIAGNOSIS — K5900 Constipation, unspecified: Secondary | ICD-10-CM

## 2014-03-02 NOTE — Progress Notes (Addendum)
Attending physician note: Presenting problems, physical findings, medications, reviewed with resident physician Dr. Nora Sadek and I concur with her management. Christianne Zacher, M.D., FACP 

## 2014-03-02 NOTE — Assessment & Plan Note (Addendum)
Continued same lower pelvic pain that coincides with mensturation. Pt was evaluated by OB/GYN but didn't start taking OCP perscribed given fear of "gaining weight" in light of her upcoming marriage. She has resolution of the pain with tylenol that she has been taking. She had a pregnancy test which was negative in the ED and her CBC was normal. She has not been sexually active and has no other red flag symptoms or signs to warrant need for emergent imaging today.  -f/u with OB/GYN -tsh -treat constipation

## 2014-03-02 NOTE — Patient Instructions (Addendum)
General Instructions: For your belly pain [facility] about starting your pills again and maybe needing some more pics of your uterus.   For the constipation. Start with plenty of fluids, prune juice, OTC miralax or senna-s.   You can continue to take ibuprophen and tylenol for pain.   Please bring your medicines with you each time you come to clinic.  Medicines may include prescription medications, over-the-counter medications, herbal remedies, eye drops, vitamins, or other pills.   Progress Toward Treatment Goals:  No flowsheet data found.  Self Care Goals & Plans:  Self Care Goal 03/02/2014  Manage my medications take my medicines as prescribed; bring my medications to every visit; refill my medications on time  Eat healthy foods drink diet soda or water instead of juice or soda; eat more vegetables; eat foods that are low in salt; eat baked foods instead of fried foods; eat fruit for snacks and desserts    No flowsheet data found.   Care Management & Community Referrals:  No flowsheet data found.     Uterine Fibroid A uterine fibroid is a growth (tumor) that occurs in your uterus. This type of tumor is not cancerous and does not spread out of the uterus. You can have one or many fibroids. Fibroids can vary in size, weight, and where they grow in the uterus. Some can become quite large. Most fibroids do not require medical treatment, but some can cause pain or heavy bleeding during and between periods. CAUSES  A fibroid is the result of a single uterine cell that keeps growing (unregulated), which is different than most cells in the human body. Most cells have a control mechanism that keeps them from reproducing without control.  SIGNS AND SYMPTOMS   Bleeding.  Pelvic pain and pressure.  Bladder problems due to the size of the fibroid.  Infertility and miscarriages depending on the size and location of the fibroid. DIAGNOSIS  Uterine  fibroids are diagnosed through a physical exam. Your health care provider may feel the lumpy tumors during a pelvic exam. Ultrasonography may be done to get information regarding size, location, and number of tumors.  TREATMENT   Your health care provider may recommend watchful waiting. This involves getting the fibroid checked by your health care provider to see if it grows or shrinks.   Hormone treatment or an intrauterine device (IUD) may be prescribed.   Surgery may be needed to remove the fibroids (myomectomy) or the uterus (hysterectomy). This depends on your situation. When fibroids interfere with fertility and a woman wants to become pregnant, a health care provider may recommend having the fibroids removed.  [place] care depends on how you were treated. In general:   Keep all follow-up appointments with your health care provider.   Only take over-the-counter or prescription medicines as directed by your health care provider. If you were prescribed a hormone treatment, take the hormone medicines exactly as directed. Do not take aspirin. It can cause bleeding.   Talk to your health care provider about taking iron pills.  If your periods are troublesome but not so heavy, lie down with your feet raised slightly above your heart. Place cold packs on your lower abdomen.   If your periods are heavy, write down the number of pads or tampons you use per month. Bring this information to your health care provider.   Include green vegetables in your diet.  [place]  IF:  You have pelvic pain or cramps not controlled with medicines.   You have a sudden increase in pelvic pain.   You have an increase in bleeding between and during periods.   You have excessive periods and soak tampons or pads in a half hour or less.  You feel lightheaded or have fainting episodes. Document Released: 08/22/2000 Document Revised: 06/15/2013 Document  Reviewed: 03/24/2013 [facility] Patient Information 2015 [place], [place]. This information is not intended to replace advice given to you by your health care provider. Make sure you discuss any questions you have with your health care provider. Constipation Constipation is when a person:  Poops (has a bowel movement) less than 3 times a week.  Has a hard time pooping.  Has poop that is dry, hard, or bigger than normal. HOME CARE   Eat foods with a lot of fiber in them. This includes fruits, vegetables, beans, and whole grains such as brown rice.  Avoid fatty foods and foods with a lot of sugar. This includes french fries, hamburgers, cookies, candy, and soda.  If you are not getting enough fiber from food, take products with added fiber in them (supplements).  Drink enough fluid to keep your pee (urine) clear or pale yellow.  Exercise on a regular basis, or as told by your doctor.  Go to the restroom when you feel like you need to poop. Do not hold it.  Only take medicine as told by your doctor. Do not take medicines that help you poop (laxatives) without talking to your doctor first. GET HELP RIGHT AWAY IF:   You have bright red blood in your poop (stool).  Your constipation lasts more than 4 days or gets worse.  You have belly (abdominal) or butt (rectal) pain.  You have thin poop (as thin as a pencil).  You lose weight, and it cannot be explained. MAKE SURE YOU:   Understand these instructions.  Will watch your condition.  Will get help right away if you are not doing well or get worse. Document Released: 02/11/2008 Document Revised: 08/30/2013 Document Reviewed: 06/06/2013 [facility] Patient Information 2015 [place], [place]. This information is not intended to replace advice given to you by your health care provider. Make sure you discuss any questions you have with your health care provider.

## 2014-03-02 NOTE — Assessment & Plan Note (Signed)
Will provide symptomatic management as pt did have BM this AM and has no signs or symptoms to suggest impaction.  -pt didn't want medications  -plenty of fluids, prune juice, OTC miralax and senna-s recommended

## 2014-03-02 NOTE — Progress Notes (Signed)
Subjective:    [name], female    DOB: 10/30/1985, 28 y.o.   MRN: [id number]  HPI Alyssa Hall 28 yo female pmh as listed below presents acutely for abdominal pain.   Pt states that pain started 2 days ago and is crampy in nature. Her period exacerbates the pain and tylenol reliefs the pain. She has tried just tylenol for relief. She has been diagnosed with uterine fibroids by transvaginal ultrasound back in 2013 and that was repeated in 2014 that confirmed this along with a thickened endometrium. The patient was originally prescribed OCPs but she refused to take them with fear that she "might gain weight" as she read about online. Her greatest concern is her upcoming wedding this year and therefore she doesn't want to gain weight before. She does have a very strong desire to have children and therefore also feared that taking the OCPs would prevent that. Her period has stopped for the past 2 days although she had continuous flow from 01/27/14 until 03/01/14. This has been pretty normal for the patient recently.the patient has also had new constipation but had a small BM this morning but was hard in nature and the patient had strain. The patient has not tried anything over-the-counter for relief  The patient denied any shortness of breath, chest pain, tachycardia, lightheadedness, or lower extremity edema, any other sites of gross bleeding such as hematuria or beat or BRBPR, no black tarry stools.  Past Medical History  Diagnosis Date  . Thyrotoxicosis   . Atrial tachycardia   . Dizziness   . Hypothyroid   . Depression   . Anxiety   . Meniere's disease    Current Outpatient Prescriptions on File Prior to Visit  Medication Sig Dispense Refill  . Carboxymethylcell-Hypromellose 0.25-0.3 % GEL Apply 1 drop to eye 3 (three) times daily as needed.  15 mL  0  . estrogens, conjugated, (PREMARIN) 1.25 MG tablet Take 2 tablets (2.5 mg total) by mouth daily.  60 tablet  0  . ferrous  sulfate 325 (65 FE) MG tablet Take 1 tablet (325 mg total) by mouth daily.  30 tablet  3  . hydrochlorothiazide (HYDRODIURIL) 25 MG tablet Take 1 tablet (25 mg total) by mouth daily.  30 tablet  3  . hydrOXYzine (ATARAX/VISTARIL) 25 MG tablet Take 1 tablet (25 mg total) by mouth 3 (three) times daily as needed for itching.  30 tablet  1  . levothyroxine (SYNTHROID, LEVOTHROID) 88 MCG tablet Take 88 mcg by mouth daily before breakfast.      . Norethindrone-Ethinyl Estradiol-Fe Biphas (LO LOESTRIN FE) 1 MG-10 MCG / 10 MCG tablet Take 1 tablet by mouth daily.  1 Package  1  . potassium chloride (K-DUR) 10 MEQ tablet Take 20 mEq by mouth daily.       Marland Kitchen triamcinolone cream (KENALOG) 0.1 % Apply 1 application topically 2 (two) times daily.  30 g  0  . [DISCONTINUED] atenolol (TENORMIN) 100 MG tablet Take 100 mg by mouth 2 (two) times daily as needed. For  tremors, palpitations, anxiety.       No current facility-administered medications on file prior to visit.   Social, surgical, family history reviewed with patient and updated in appropriate chart locations.   Review of Systems Negative except as listed in HPI     Objective:   Physical Exam Filed Vitals:   03/02/14 1329  BP: 114/79  Pulse: 109  Temp: 97.4 F (36.3 C)   General:  sitting in chair, NAD HEENT: PERRL, EOMI, no scleral icterus Cardiac: RRR, no rubs, murmurs or gallops Pulm: clear to auscultation bilaterally, moving normal volumes of air Abd: soft, nontender, nondistended, BS present Ext: warm and well perfused, no pedal edema Neuro: alert and oriented X3, cranial nerves II-XII grossly intact    Assessment & Plan:  Please see problem oriented charting  Pt discussed with Dr. Beryle Beams

## 2014-03-02 NOTE — Assessment & Plan Note (Signed)
Pt appears to have a hx of hypokalemia that doesn't appear to have been investigated. Therefore will repeat today and see if this continues as pt has d/c her KDUR at this time.  -cmet  -pt has a hx of HTN (although not taking meds for HTN and not HTN today) therefore maybe aldo and renin levels should be addressed at followup

## 2014-03-02 NOTE — Assessment & Plan Note (Signed)
Given last check of TSH was in 2014 and normal will recheck in light of continued bleeding.  -TSH

## 2014-03-03 LAB — COMPREHENSIVE METABOLIC PANEL
ALBUMIN: 4.2 g/dL (ref 3.5–5.2)
ALK PHOS: 49 U/L (ref 39–117)
ALT: 13 U/L (ref 0–35)
AST: 15 U/L (ref 0–37)
BUN: 5 mg/dL — AB (ref 6–23)
CALCIUM: 9.7 mg/dL (ref 8.4–10.5)
CO2: 25 mEq/L (ref 19–32)
Chloride: 102 mEq/L (ref 96–112)
Creat: 0.78 mg/dL (ref 0.50–1.10)
Glucose, Bld: 93 mg/dL (ref 70–99)
POTASSIUM: 3.6 meq/L (ref 3.5–5.3)
SODIUM: 137 meq/L (ref 135–145)
Total Bilirubin: 0.4 mg/dL (ref 0.2–1.2)
Total Protein: 7.2 g/dL (ref 6.0–8.3)

## 2014-03-03 LAB — TSH: TSH: 0.649 u[IU]/mL (ref 0.350–4.500)

## 2014-03-24 ENCOUNTER — Ambulatory Visit: Payer: No Typology Code available for payment source | Admitting: Obstetrics and Gynecology

## 2014-03-24 ENCOUNTER — Telehealth: Payer: Self-pay | Admitting: *Deleted

## 2014-03-24 NOTE — Telephone Encounter (Signed)
Attempted to contact patient, no answer, left message for patient to contact clinic and reschedule appointment.

## 2014-04-12 ENCOUNTER — Ambulatory Visit: Payer: No Typology Code available for payment source | Admitting: Internal Medicine

## 2014-04-12 ENCOUNTER — Encounter: Payer: Self-pay | Admitting: Internal Medicine

## 2014-04-12 ENCOUNTER — Ambulatory Visit (INDEPENDENT_AMBULATORY_CARE_PROVIDER_SITE_OTHER): Payer: No Typology Code available for payment source | Admitting: Internal Medicine

## 2014-04-12 VITALS — BP 135/83 | HR 99 | Temp 98.3°F | Resp 20 | Ht 65.0 in | Wt 219.1 lb

## 2014-04-12 DIAGNOSIS — I10 Essential (primary) hypertension: Secondary | ICD-10-CM

## 2014-04-12 DIAGNOSIS — Z202 Contact with and (suspected) exposure to infections with a predominantly sexual mode of transmission: Secondary | ICD-10-CM | POA: Insufficient documentation

## 2014-04-12 LAB — POCT URINE PREGNANCY: PREG TEST UR: NEGATIVE

## 2014-04-12 MED ORDER — AZITHROMYCIN 1 G PO PACK: 1.0000 g | PACK | Freq: Once | ORAL | Status: DC

## 2014-04-12 MED ORDER — CEFTRIAXONE SODIUM 1 G IJ SOLR
250.0000 mg | Freq: Once | INTRAMUSCULAR | Status: AC
  Administered 2014-04-12: 250 mg via INTRAMUSCULAR

## 2014-04-12 MED ORDER — LIDOCAINE HCL 1 % IJ SOLN: 250.0000 mg | Freq: Once | INTRAMUSCULAR | Status: DC

## 2014-04-12 NOTE — Assessment & Plan Note (Addendum)
Pt reports yellowish, watery vaginal discharge for the last month.  Also has an odor that she cannot describe.  Denies any fever/chills, itching, burning, frequency of urination, dysuria, or abdominal pain.  Denies rash or sores.  No abdominal pain.  No history of STIs, but reports that she was questionably diagnosed with PID at a prior OV.  She is concerned because her boyfriend tested positive for chlamydia 2 weeks ago and is concerned based on her symptoms.  She is currently on her menstrual period and has been for the past 1.5 weeks (she has a h/o uterine fibroids and has heavy menses).  She does not use protection and we had a long discussion about the benefits of using protection.  I told her where she could get free condoms.  She reports not using protection because she "gets caught up in the moment."  Denies not using protection for religious beliefs or any other reason.  Would like to be tested for STIs. -HIV, RPR, GC/chlamydia (urine cytology), urine pregnancy -provided several condoms -advised on places she could obtain free condoms such as the health department -counseled on safe sex practices  -follow-up in 6 months or as needed

## 2014-04-12 NOTE — Progress Notes (Signed)
[name], female   DOB: 1985/10/24, 28 y.o.   MRN: [id number]    Subjective:   [name]    DOB: 01-Jun-1986 28 y.o.    MRN: [id number] Health Maintenance Due: Health Maintenance Due  Topic Date Due  . Influenza Vaccine  04/08/2014    _________________________________________________  HPI: Alyssa Hall is a 28 y.o. female here for an acute visit.  Pt has a PMH outlined below.  Please see problem-based charting assessment and plan note for further details of medical issues addressed at today's visit.  PMH: Past Medical History  Diagnosis Date  . Thyrotoxicosis   . Atrial tachycardia   . Dizziness   . Hypothyroid   . Depression   . Anxiety   . Meniere's disease     Medications: Current Outpatient Prescriptions on File Prior to Visit  Medication Sig Dispense Refill  . Carboxymethylcell-Hypromellose 0.25-0.3 % GEL Apply 1 drop to eye 3 (three) times daily as needed.  15 mL  0  . estrogens, conjugated, (PREMARIN) 1.25 MG tablet Take 2 tablets (2.5 mg total) by mouth daily.  60 tablet  0  . ferrous sulfate 325 (65 FE) MG tablet Take 1 tablet (325 mg total) by mouth daily.  30 tablet  3  . hydrochlorothiazide (HYDRODIURIL) 25 MG tablet Take 1 tablet (25 mg total) by mouth daily.  30 tablet  3  . hydrOXYzine (ATARAX/VISTARIL) 25 MG tablet Take 1 tablet (25 mg total) by mouth 3 (three) times daily as needed for itching.  30 tablet  1  . levothyroxine (SYNTHROID, LEVOTHROID) 88 MCG tablet Take 88 mcg by mouth daily before breakfast.      . Norethindrone-Ethinyl Estradiol-Fe Biphas (LO LOESTRIN FE) 1 MG-10 MCG / 10 MCG tablet Take 1 tablet by mouth daily.  1 Package  1  . potassium chloride (K-DUR) 10 MEQ tablet Take 20 mEq by mouth daily.       Marland Kitchen triamcinolone cream (KENALOG) 0.1 % Apply 1 application topically 2 (two) times daily.  30 g  0  . [DISCONTINUED] atenolol (TENORMIN) 100 MG tablet Take 100 mg by mouth 2 (two) times daily as needed.  For  tremors, palpitations, anxiety.       No current facility-administered medications on file prior to visit.    Allergies: No Known Allergies  FH: No family history on file.  SH: History   Social History  . Marital Status: Single    Spouse Name: N/A    Number of Children: N/A  . Years of Education: N/A   Social History Main Topics  . Smoking status: Never Smoker   . Smokeless tobacco: Never Used  . Alcohol Use: No  . Drug Use: No  . Sexual Activity: None   Other Topics Concern  . None   Social History Narrative   Uninsured.   Lives in [place].          Review of Systems: Constitutional: Negative for fever, chills and weight loss.  Eyes: Negative for blurred vision.  Respiratory: Negative for cough and shortness of breath.  Cardiovascular: Negative for chest pain, palpitations and leg swelling.  Gastrointestinal: Negative for nausea, vomiting, abdominal pain, diarrhea, constipation and blood in stool.  Genitourinary: Negative for dysuria, urgency and frequency.  Musculoskeletal: Negative for myalgias and back pain.  Neurological: Negative for dizziness, weakness and headaches.  Skin: +rash     Objective:   Vital Signs: Filed Vitals:   04/12/14 1435  BP: 135/83  Pulse:  99  Temp: 98.3 F (36.8 C)  TempSrc: Oral  Resp: 20  Height: 5\' 5"  (1.651 m)  Weight: 219 lb 1.6 oz (99.383 kg)  SpO2: 98%      BP Readings from Last 3 Encounters:  04/12/14 135/83  03/02/14 114/79  02/13/14 112/75    Physical Exam: Constitutional: Vital signs reviewed.  Patient is well-developed and well-nourished in NAD and cooperative with exam.  Head: Normocephalic and atraumatic. Eyes: PERRL, EOMI, conjunctivae nl, no scleral icterus.  Neck: Supple. Cardiovascular: RRR, no MRG. Pulmonary/Chest: normal effort, non-tender to palpation, CTAB, no wheezes, rales, or rhonchi. Abdominal: Soft. NT/ND +BS. Neurological: A&O x3, cranial nerves II-XII are grossly intact,  moving all extremities. Extremities: 2+DP b/l; no pitting edema. Skin: Warm, dry and intact. Macular rash on trunk and lower extremities.    Assessment & Plan:   Assessment and plan was discussed and formulated with my attending.

## 2014-04-12 NOTE — Assessment & Plan Note (Signed)
BP good control today.BP: 135/83 mmHg. -continue current regimen of HCTZ 25mg  daily

## 2014-04-12 NOTE — Patient Instructions (Signed)
Thank you for your visit today.   Please return to the internal medicine clinic in 6 month(s) or sooner if needed.     Your current medical regimen is effective;  continue present plan and take all medications as prescribed.    I have made the following additions/changes to your medications: I have sent the following medications to your pharmacy--azithromycin, please take 2 500mg  tablets once today.  I have also given you a shot of ceftriaxone.   Please be sure to bring all of your medications with you to every visit; this includes herbal supplements, vitamins, eye drops, and any over-the-counter medications.   Should you have any questions regarding your medications and/or any new or worsening symptoms, please be sure to call the clinic at [phone].   If you believe that you are suffering from a life threatening condition or one that may result in the loss of limb or function, then you should call 911 or proceed to the nearest Emergency Department.  Please use methods that will protect you from STDs.      A healthy lifestyle and preventative care can promote health and wellness.   Maintain regular health, dental, and eye exams.  Eat a healthy diet. Foods like vegetables, fruits, whole grains, low-fat dairy products, and lean protein foods contain the nutrients you need without too many calories. Decrease your intake of foods high in solid fats, added sugars, and salt. Get information about a proper diet from your caregiver, if necessary.  Regular physical exercise is one of the most important things you can do for your health. Most adults should get at least 150 minutes of moderate-intensity exercise (any activity that increases your heart rate and causes you to sweat) each week. In addition, most adults need muscle-strengthening exercises on 2 or more days a week.   Maintain a healthy weight. The body mass index (BMI) is a screening tool to identify possible weight problems. It  provides an estimate of body fat based on height and weight. Your caregiver can help determine your BMI, and can help you achieve or maintain a healthy weight. For adults 20 years and older:  A BMI below 18.5 is considered underweight.  A BMI of 18.5 to 24.9 is normal.  A BMI of 25 to 29.9 is considered overweight.  A BMI of 30 and above is considered obese.

## 2014-04-13 LAB — HIV ANTIBODY (ROUTINE TESTING W REFLEX): HIV 1&2 Ab, 4th Generation: NONREACTIVE

## 2014-04-14 NOTE — Progress Notes (Signed)
Case discussed with Dr. Gill at the time of the visit.  We reviewed the resident's history and exam and pertinent patient test results.  I agree with the assessment, diagnosis, and plan of care documented in the resident's note. 

## 2014-04-28 ENCOUNTER — Ambulatory Visit (INDEPENDENT_AMBULATORY_CARE_PROVIDER_SITE_OTHER): Payer: No Typology Code available for payment source | Admitting: Internal Medicine

## 2014-04-28 ENCOUNTER — Ambulatory Visit: Payer: No Typology Code available for payment source | Admitting: Internal Medicine

## 2014-04-28 DIAGNOSIS — H8102 Meniere's disease, left ear: Secondary | ICD-10-CM

## 2014-04-28 DIAGNOSIS — H8109 Meniere's disease, unspecified ear: Secondary | ICD-10-CM

## 2014-04-28 NOTE — Patient Instructions (Signed)
Thank you for your visit today.   Please return to the internal medicine clinic as needed and if your symptoms are not improving.   I think this is an episode of your Meniere's disease and will refer you to Dr. Redmond Baseman.  Your current medical regimen is effective;  continue present plan and take all medications as prescribed.   Try to limit your salt intake to 2000mg  daily.  Also limit caffeine to 1 drink/day.   Please be sure to bring all of your medications with you to every visit; this includes herbal supplements, vitamins, eye drops, and any over-the-counter medications.   Should you have any questions regarding your medications and/or any new or worsening symptoms, please be sure to call the clinic at [phone].   If you believe that you are suffering from a life threatening condition or one that may result in the loss of limb or function, then you should call 911 or proceed to the nearest Emergency Department.    Meniere Disease Meniere disease is an inner ear disorder. It causes attacks of a spinning sensation (vertigo) and ringing in the ear (tinnitus). It also causes hearing loss and a sensation of fullness or pressure in your ear.  Meniere disease is lifelong. It may get worse over time. Symptoms usually begin in one ear but may eventually affect both ears.  CAUSES Meniere disease is caused by having too much of the fluid that is in your inner ear (endolymph). When endolymph builds up in your inner ear, it affects the nerves that control balance and hearing. The reason for the endolymph buildup is not known. Possible causes include:  Allergy.  An abnormal reaction of the body's defense system (autoimmune disease).  Viral infection of the inner ear.  Head injury. RISK FACTORS  Age older than 40 years.  Family history of Meniere disease.  History of autoimmune disease.  History of migraine headaches. SIGNS AND SYMPTOMS Symptoms of Meniere disease can come and go and may  last for up to 4 hours at a time. Symptoms usually start in one ear and may become more frequent and eventually involve both ears. Symptoms can include:  Fullness and pressure in your ear.  Roaring or ringing in your ear.  Vertigo and loss of balance.  Decreased hearing.  Nausea and vomiting. DIAGNOSIS Your health care provider will perform a physical exam. Tests may be done to confirm a diagnosis of Meniere disease. These tests may include:  A hearing test (audiogram).  An electronystagmogram. This tests your balance nerve (vestibular nerve).  Imaging studies, such as CT or MRI, of your inner ear. TREATMENT There is no cure for Meniere disease, but it can be managed. Management may include:  A diet that may help relieve symptoms of Meniere disease.  Use of medicines to reduce:  Vertigo.  Nausea.  Fluid retention.  Use of an air pressure pulse generator. This is a machine that sends small pressure pulses into your ear canal.  Inner ear surgery (rare). When you experience symptoms, it can be helpful to lie down on a flat surface and focus your eyes on one object that does not move. Try to stay in that position until your symptoms go away.  HOME CARE INSTRUCTIONS   Take medicines only as directed by your health care provider.  Eat the same amount of food at the same time every day, including snacks.  Do not skip meals.  Limit the salt in your diet to 1,000 mg a day.  Avoid caffeine.  Limit alcoholic drinks to one drink a day.  Do not eat foods containing monosodium glutamate (MSG).  Drink enough fluids to keep your urine clear or pale yellow.  Do not use any tobacco products including cigarettes, chewing tobacco, or electronic cigarettes. If you need help quitting, ask your health care provider.  Find ways to reduce or avoid stress. SEEK MEDICAL CARE IF:   You have symptoms that last longer than 4 hours.  You have new or more severe symptoms. SEEK IMMEDIATE  MEDICAL CARE IF:   You have been vomiting for 24 hours.  You are not able to keep fluids down.  You have chest pain or trouble breathing. Document Released: 08/22/2000 Document Revised: 01/09/2014 Document Reviewed: 08/08/2013 [facility] Patient Information 2015 [place], [place]. This information is not intended to replace advice given to you by your health care provider. Make sure you discuss any questions you have with your health care provider.

## 2014-04-28 NOTE — Progress Notes (Signed)
[name], female   DOB: 1986-01-04, 28 y.o.   MRN: [id number]    Subjective:   [name]    DOB: 12-08-1985 28 y.o.    MRN: [id number] Health Maintenance Due: Health Maintenance Due  Topic Date Due  . Influenza Vaccine  04/08/2014    _________________________________________________  HPI: [name] is a 28 y.o. female here for an acute visit. Pt has a PMH outlined below.  Please see problem-based charting assessment and plan note for further details of medical issues addressed at today's visit.  PMH: Past Medical History  Diagnosis Date  . Thyrotoxicosis   . Atrial tachycardia   . Dizziness   . Hypothyroid   . Depression   . Anxiety   . Meniere's disease     Medications: Current Outpatient Prescriptions on File Prior to Visit  Medication Sig Dispense Refill  . levothyroxine (SYNTHROID, LEVOTHROID) 88 MCG tablet Take 88 mcg by mouth daily before breakfast.      . [DISCONTINUED] atenolol (TENORMIN) 100 MG tablet Take 100 mg by mouth 2 (two) times daily as needed. For  tremors, palpitations, anxiety.       No current facility-administered medications on file prior to visit.    Allergies: No Known Allergies  FH: No family history on file.  SH: History   Social History  . Marital Status: Single    Spouse Name: N/A    Number of Children: N/A  . Years of Education: N/A   Social History Main Topics  . Smoking status: Never Smoker   . Smokeless tobacco: Never Used  . Alcohol Use: No  . Drug Use: No  . Sexual Activity: None   Other Topics Concern  . None   Social History Narrative   Uninsured.   Lives in [place].          Review of Systems: Constitutional: Negative for fever, chills and weight loss.  Eyes: Negative for blurred vision.  Respiratory: Negative for cough and shortness of breath.  Cardiovascular: Negative for chest pain, palpitations and leg swelling.  Gastrointestinal: +nausea, +vomiting,  abdominal pain, diarrhea, constipation and blood in stool.  Genitourinary: Negative for dysuria, urgency and frequency.  Musculoskeletal: Negative for myalgias and back pain.  Neurological: +dizziness, weakness and headaches.     Objective:   Vital Signs: There were no vitals filed for this visit.    BP Readings from Last 3 Encounters:  04/12/14 135/83  03/02/14 114/79  02/13/14 112/75    Physical Exam: Constitutional: Vital signs reviewed.  Patient is well-developed and well-nourished in NAD and cooperative with exam.  Head: Normocephalic and atraumatic. Eyes: PERRL, EOMI, conjunctivae nl, no scleral icterus.  Neck: Supple. Cardiovascular: RRR, no MRG. Pulmonary/Chest: normal effort, non-tender to palpation, CTAB, no wheezes, rales, or rhonchi. Abdominal: Obese. Soft. NT/ND +BS. Neurological: A&O x3, cranial nerves II-XII are grossly intact, moving all extremities. Extremities: 2+DP b/l; no pitting edema. Skin: Warm, dry and intact. Rash.    Assessment & Plan:   Assessment and plan was discussed and formulated with my attending.

## 2014-04-30 ENCOUNTER — Encounter: Payer: Self-pay | Admitting: Internal Medicine

## 2014-04-30 MED ORDER — POTASSIUM CHLORIDE ER 20 MEQ PO TBCR: 20.0000 meq | EXTENDED_RELEASE_TABLET | Freq: Every day | ORAL | Status: DC

## 2014-04-30 MED ORDER — HYDROCHLOROTHIAZIDE 12.5 MG PO TABS: 12.5000 mg | ORAL_TABLET | Freq: Every day | ORAL | Status: DC

## 2014-04-30 NOTE — Assessment & Plan Note (Addendum)
Pt reports having symptoms c/w Meniere's disease for the past week.  She describes vertigo, N/V, hearing loss (left ear), fullness in the ears, and tinnitus (left ear).  She denies any recent URI symptoms to suggest acute labyrinthitis.  Denies any h/o allergic rhinitis and denies HA, rhinorrhea, watery/itchy eyes, post-nasal drip or cough.  No fever/chills.  No focal weakness to suggest TIA.  Has a h/o Meniere's disease previously treated with meclizine which she cannot tolerate (ended up in the ED).  Has seen ENT in the past and diagnosed with this--was prescribed chlorthalidone.  She states the attacks have been occuring several times daily for the past week.  Not related to standing--also occurs while sitting.  Orthostatics neg.  Physical exam unremarkable.  Of note, she also quit taking HCTZ during the time frame that her symptoms began (for unknown reasons).  Also describes consuming more salt recently and been under more stress which can be triggers for Meniere's.   -referral to ENT (Dr. Redmond Baseman) -will call pt to inquire as to why she quit taking HCTZ and will ask her to resume a smaller dose of HCTZ 12.5mg  daily with K+ 58mEq -reminded to avoid possible triggers including stress, caffeine, excess sodium (limit to 2000mg /d) -consider referral for vestibular rehab

## 2014-05-02 NOTE — Progress Notes (Signed)
Internal Medicine Clinic Attending Date of visit: 04/28/2014   Case discussed with Dr. Gordy Levan soon after the resident saw the patient.  We reviewed the resident's history and exam and pertinent patient test results.  I agree with the assessment, diagnosis, and plan of care documented in the resident's note.

## 2014-05-08 ENCOUNTER — Other Ambulatory Visit: Payer: Self-pay

## 2014-05-10 ENCOUNTER — Ambulatory Visit: Payer: No Typology Code available for payment source | Admitting: Internal Medicine

## 2014-05-16 ENCOUNTER — Ambulatory Visit: Payer: No Typology Code available for payment source | Admitting: Internal Medicine

## 2014-05-24 ENCOUNTER — Ambulatory Visit (INDEPENDENT_AMBULATORY_CARE_PROVIDER_SITE_OTHER): Payer: No Typology Code available for payment source | Admitting: Obstetrics and Gynecology

## 2014-05-24 ENCOUNTER — Telehealth: Payer: Self-pay | Admitting: General Practice

## 2014-05-24 ENCOUNTER — Encounter: Payer: Self-pay | Admitting: Obstetrics and Gynecology

## 2014-05-24 VITALS — BP 122/86 | HR 122 | Ht 63.0 in | Wt 211.2 lb

## 2014-05-24 DIAGNOSIS — N925 Other specified irregular menstruation: Secondary | ICD-10-CM

## 2014-05-24 DIAGNOSIS — N949 Unspecified condition associated with female genital organs and menstrual cycle: Secondary | ICD-10-CM

## 2014-05-24 DIAGNOSIS — N938 Other specified abnormal uterine and vaginal bleeding: Secondary | ICD-10-CM

## 2014-05-24 MED ORDER — NORGESTIMATE-ETH ESTRADIOL 0.25-35 MG-MCG PO TABS: 1.0000 | ORAL_TABLET | Freq: Every day | ORAL | Status: DC

## 2014-05-24 NOTE — Telephone Encounter (Signed)
Opened in error

## 2014-05-24 NOTE — Progress Notes (Signed)
Patient never took the birth control pills. She is still having bleeding states that periods last 2+ weeks.

## 2014-05-24 NOTE — Progress Notes (Signed)
[name], female   DOB: 1986-03-22, 28 y.o.   MRN: [id number] 28 yo [id number] presenting today for follow up on menorrhagia. Patient has never taken the prescribed birth control pill again for fear of weight gain. I had a long discussion with her regarding the different birth control options available. Patient declined LARC and is not interested in depo-provera injections. Patient opted to take birth control pills. New prescription provided. Again reassurance was provided regarding weight gain.  RTC prn A total of 20 minutes was spent with counseling the patient

## 2014-07-07 ENCOUNTER — Ambulatory Visit: Payer: Self-pay | Admitting: Internal Medicine

## 2014-07-10 ENCOUNTER — Ambulatory Visit (INDEPENDENT_AMBULATORY_CARE_PROVIDER_SITE_OTHER): Payer: Self-pay | Admitting: Internal Medicine

## 2014-07-10 ENCOUNTER — Ambulatory Visit (INDEPENDENT_AMBULATORY_CARE_PROVIDER_SITE_OTHER): Payer: Self-pay | Admitting: *Deleted

## 2014-07-10 ENCOUNTER — Encounter: Payer: Self-pay | Admitting: Internal Medicine

## 2014-07-10 VITALS — BP 122/77 | HR 109 | Temp 98.0°F | Ht 64.5 in | Wt 212.5 lb

## 2014-07-10 DIAGNOSIS — H8102 Meniere's disease, left ear: Secondary | ICD-10-CM

## 2014-07-10 DIAGNOSIS — E876 Hypokalemia: Secondary | ICD-10-CM

## 2014-07-10 DIAGNOSIS — R002 Palpitations: Secondary | ICD-10-CM

## 2014-07-10 DIAGNOSIS — Z23 Encounter for immunization: Secondary | ICD-10-CM

## 2014-07-10 DIAGNOSIS — D509 Iron deficiency anemia, unspecified: Secondary | ICD-10-CM

## 2014-07-10 DIAGNOSIS — I10 Essential (primary) hypertension: Secondary | ICD-10-CM

## 2014-07-10 LAB — CBC WITH DIFFERENTIAL/PLATELET
BASOS ABS: 0 10*3/uL (ref 0.0–0.1)
BASOS PCT: 0 % (ref 0–1)
EOS PCT: 2 % (ref 0–5)
Eosinophils Absolute: 0.1 10*3/uL (ref 0.0–0.7)
HEMATOCRIT: 32.6 % — AB (ref 36.0–46.0)
HEMOGLOBIN: 10.9 g/dL — AB (ref 12.0–15.0)
Lymphocytes Relative: 28 % (ref 12–46)
Lymphs Abs: 1.6 10*3/uL (ref 0.7–4.0)
MCH: 23.9 pg — ABNORMAL LOW (ref 26.0–34.0)
MCHC: 33.4 g/dL (ref 30.0–36.0)
MCV: 71.3 fL — AB (ref 78.0–100.0)
MONO ABS: 0.7 10*3/uL (ref 0.1–1.0)
MONOS PCT: 12 % (ref 3–12)
NEUTROS ABS: 3.3 10*3/uL (ref 1.7–7.7)
Neutrophils Relative %: 58 % (ref 43–77)
Platelets: 360 10*3/uL (ref 150–400)
RBC: 4.57 MIL/uL (ref 3.87–5.11)
RDW: 14.4 % (ref 11.5–15.5)
WBC: 5.7 10*3/uL (ref 4.0–10.5)

## 2014-07-10 MED ORDER — FERROUS SULFATE 325 (65 FE) MG PO TABS: 325.0000 mg | ORAL_TABLET | Freq: Three times a day (TID) | ORAL | Status: DC

## 2014-07-10 NOTE — Assessment & Plan Note (Signed)
Chronic, tachycardia, feeling today more than usual along with fatigue and pica. Likely multifactorial in setting of hypothyroidism (TSH 0.649 02/2014) and iron deficiency anemia. Appears to be sinus tachycardia. If still persistent despite iron supplementation, consider further work up on follow up visit including repeat EKG, if BP elevated, could consider BB. Does not appear to have any pulmonary complaints,  ?echo  -check cbc and bmet today -restart iron supplementation

## 2014-07-10 NOTE — Assessment & Plan Note (Signed)
Recommend getting ent notes for pcp to review  Was apparently started on diuretic by ent to help with symptoms, however, she takes on and off and says not really helping.  -will stop hctz today and monitor -gave her information on epley maneuver per her request today -if no improvement or worsening, consider vestibular rehab consult on next visit

## 2014-07-10 NOTE — Patient Instructions (Signed)
General Instructions:  Thank you for bringing your medicines today. This helps [place] keep you safe from mistakes.  Lets stop your HCTZ and potassium today and return to clinic in 2 weeks before you leave to see if we need to restart the medication  Please restart iron tablets 325mg  three times a day  If you have worsening of your heart rate beating fast, or chest pain or sob, feeling like you will faint call us right away [phone]   Progress Toward Treatment Goals:  Treatment Goal 07/10/2014  Blood pressure at goal    Self Care Goals & Plans:  Self Care Goal 07/10/2014  Manage my medications take my medicines as prescribed; bring my medications to every visit  Eat healthy foods eat more vegetables; eat foods that are low in salt; eat baked foods instead of fried foods  Be physically active find an activity I enjoy; take a walk every day    No flowsheet data found.   Care Management & Community Referrals:  No flowsheet data found.   Epley Maneuver Self-Care WHAT IS THE EPLEY MANEUVER? The Epley maneuver is an exercise you can do to relieve symptoms of benign paroxysmal positional vertigo (BPPV). This condition is often just referred to as vertigo. BPPV is caused by the movement of tiny crystals (canaliths) inside your inner ear. The accumulation and movement of canaliths in your inner ear causes a sudden spinning sensation (vertigo) when you move your head to certain positions. Vertigo usually lasts about 30 seconds. BPPV usually occurs in just one ear. If you get vertigo when you lie on your left side, you probably have BPPV in your left ear. Your health care provider can tell you which ear is involved.  BPPV may be caused by a head injury. Many people older than 50 get BPPV for unknown reasons. If you have been diagnosed with BPPV, your health care provider may teach you how to do this maneuver. BPPV is not life threatening (benign) and usually goes away in time.  WHEN SHOULD I  PERFORM THE EPLEY MANEUVER? You can do this maneuver at home whenever you have symptoms of vertigo. You may do the Epley maneuver up to 3 times a day until your symptoms of vertigo go away. HOW SHOULD I DO THE EPLEY MANEUVER? 1. Sit on the edge of a bed or table with your back straight. Your legs should be extended or hanging over the edge of the bed or table.  2. Turn your head halfway toward the affected ear.  3. Lie backward quickly with your head turned until you are lying flat on your back. You may want to position a pillow under your shoulders.  4. Hold this position for 30 seconds. You may experience an attack of vertigo. This is normal. Hold this position until the vertigo stops. 5. Then turn your head to the opposite direction until your unaffected ear is facing the floor.  6. Hold this position for 30 seconds. You may experience an attack of vertigo. This is normal. Hold this position until the vertigo stops. 7. Now turn your whole body to the same side as your head. Hold for another 30 seconds.  8. You can then sit back up. ARE THERE RISKS TO THIS MANEUVER? In some cases, you may have other symptoms (such as changes in your vision, weakness, or numbness). If you have these symptoms, stop doing the maneuver and call your health care provider. Even if doing these maneuvers relieves your vertigo, you  may still have dizziness. Dizziness is the sensation of light-headedness but without the sensation of movement. Even though the Epley maneuver may relieve your vertigo, it is possible that your symptoms will return within 5 years. WHAT SHOULD I DO AFTER THIS MANEUVER? After doing the Epley maneuver, you can return to your normal activities. Ask your doctor if there is anything you should do at home to prevent vertigo. This may include:  Sleeping with two or more pillows to keep your head elevated.  Not sleeping on the side of your affected ear.  Getting up slowly from bed.  Avoiding  sudden movements during the day.  Avoiding extreme head movement, like looking up or bending over.  Wearing a cervical collar to prevent sudden head movements. WHAT SHOULD I DO IF MY SYMPTOMS GET WORSE? Call your health care provider if your vertigo gets worse. Call your provider right way if you have other symptoms, including:   Nausea.  Vomiting.  Headache.  Weakness.  Numbness.  Vision changes. Document Released: 08/30/2013 Document Reviewed: 08/30/2013 [facility] Patient Information 2015 [place], [place]. This information is not intended to replace advice given to you by your health care provider. Make sure you discuss any questions you have with your health care provider.

## 2014-07-10 NOTE — Progress Notes (Signed)
Ferrous sulfate rx called to [facility] on [address] per pt's request.

## 2014-07-10 NOTE — Assessment & Plan Note (Addendum)
Will hold HCTZ and kdur today; could be 2/2 to diuretic  Check bmet Recheck 2 weeks

## 2014-07-10 NOTE — Progress Notes (Signed)
Subjective:   [name]   DOB: 27-May-1986 27 y.o.   MRN: [id number]  HPI: [name] is a 28 y.o. female with PMH as listed below presenting to opc today for acute visit.   Palpitations-endorses always having a first HR but lately feels it more and craving ice. She says she has felt this way when her iron is low and she is also tired lately and light-headed.  Has not taken iron supplements for at least one month. LMP ~2 weeks ago, irregular, heavy, lasts approximately 2 weeks, and has hx of fibroids. Was recommended to start BCPs in September but has not picked them up yet. HR initially 109 and down to <100 when checking orthostatics. Denies chest pain or SOB. Did randomly have one episode of emesis yesterday. No nausea, fever, or chills. Noted to be in sinus tachycardia 01/2012 EKG and 01/2013 ?ectopic atrial tachycardia. Has been on BB in the past (atenolol and metoprolol).   HTN--well controlled. Taking HCTZ 12.5mg  daily.   Iron deficiency anemia--has not taken iron supplements for at least one month. Last Hb 13.2 02/2014 but Iron panel from 09/2013 with iron 22, ferritin 8. Craving ice.   Meniere's disease--reports going to ENT recently who apparently told her ears are okay. She still has some occasional tinnitus and feeling dizzy. Asking about epley maneuver today.   Plans to go back to [place] at the end of this month  Past Medical History  Diagnosis Date  . Thyrotoxicosis   . Atrial tachycardia   . Dizziness   . Hypothyroid   . Depression   . Anxiety   . Meniere's disease    Current Outpatient Prescriptions  Medication Sig Dispense Refill  . levothyroxine (SYNTHROID, LEVOTHROID) 88 MCG tablet Take 88 mcg by mouth daily before breakfast.    . ferrous sulfate 325 (65 FE) MG tablet Take 1 tablet (325 mg total) by mouth 3 (three) times daily with meals. 90 tablet 3  . norgestimate-ethinyl estradiol (ORTHO-CYCLEN,SPRINTEC,PREVIFEM) 0.25-35 MG-MCG tablet Take  1 tablet by mouth daily. 1 Package 11  . [DISCONTINUED] atenolol (TENORMIN) 100 MG tablet Take 100 mg by mouth 2 (two) times daily as needed. For  tremors, palpitations, anxiety.     No current facility-administered medications for this visit.   No family history on file. History   Social History  . Marital Status: Single    Spouse Name: N/A    Number of Children: N/A  . Years of Education: N/A   Social History Main Topics  . Smoking status: Never Smoker   . Smokeless tobacco: Never Used  . Alcohol Use: No  . Drug Use: No  . Sexual Activity: None   Other Topics Concern  . None   Social History Narrative   Uninsured.   Lives in [place].         Review of Systems:  Constitutional:  Denies fever, chills, diaphoresis, appetite change and fatigue.   HEENT:  Denies congestion, sore throat, rhinorrhea, sneezing, mouth sores, trouble swallowing, neck pain   Respiratory:  Denies SOB, DOE, cough, and wheezing.   Cardiovascular:  Denies chest pain, palpitations, and leg swelling.   Gastrointestinal:  Denies nausea, vomiting, abdominal pain, diarrhea, constipation, blood in stool and abdominal distention.   Genitourinary:  Denies dysuria, urgency, frequency, hematuria, flank pain and difficulty urinating.   Musculoskeletal:  Denies gait problem.   Skin:  Denies pallor, rash and wound.   Neurological:  Dizziness at times   Objective:  Physical Exam: Filed Vitals:   07/10/14 0954  BP: 122/77  Pulse: 109  Temp: 98 F (36.7 C)  TempSrc: Oral  Height: 5' 4.5" (1.638 m)  Weight: 212 lb 8 oz (96.389 kg)  SpO2: 100%  not orthostatic Vitals reviewed. General: sitting in chair, NAD HEENT: EOMI, occasional tinnitus Cardiac: Tachycardia Pulm: clear to auscultation bilaterally, no wheezes, rales, or rhonchi Abd: soft, nontender, BS present Ext: warm and well perfused, no pedal edema, +2DP B/L Neuro: alert and oriented X3, cranial nerves II-XII grossly intact, strength and  sensation to light touch equal in bilateral upper and lower extremities, finger to nose testing intact, -rombergs, gait stead  Assessment & Plan:  Discussed with Dr. Lynnae January Cbc today  Restart iron Hold hctz and K RTC 2 weeks

## 2014-07-10 NOTE — Assessment & Plan Note (Signed)
Chronic. Has not been on iron supplementation for at least 1 month. Reports having fibroids and menorrhagia. Recommended to start birth control to help with bleeding but has not started yet, however, plans to get her prescription.  Could explain her symptoms today with palpitations, light-headed, fatigue, and pica.   -check cbc today -restart iron supplementation

## 2014-07-10 NOTE — Assessment & Plan Note (Addendum)
BP Readings from Last 3 Encounters:  07/10/14 122/77  05/24/14 122/86  04/12/14 135/83   Lab Results  Component Value Date   NA 137 03/02/2014   K 3.6 03/02/2014   CREATININE 0.78 03/02/2014   Assessment: Blood pressure control: controlled Progress toward BP goal:  at goal Comments: on minimal dose of HCTZ  Plan: Medications:  will hold HCTZ for now and reassess in 2 weeks was apparently on diuretic to help with menier's disease but says it has not helped much Educational resources provided:   Self management tools provided:   Other plans: if BP remains wnl in 2 weeks, would favor off all medications and monitor K as well. If elevated, would consider alternate agent given her feeling light-headed and hypokalemia (could be the iron deficiency) but consider ACEi.

## 2014-07-11 LAB — BASIC METABOLIC PANEL WITH GFR
BUN: 12 mg/dL (ref 6–23)
CO2: 24 mEq/L (ref 19–32)
CREATININE: 0.51 mg/dL (ref 0.50–1.10)
Calcium: 9.4 mg/dL (ref 8.4–10.5)
Chloride: 98 mEq/L (ref 96–112)
GFR, Est African American: >89 mL/min
GFR, Est Non African American: >89 mL/min
GLUCOSE: 79 mg/dL (ref 70–99)
POTASSIUM: 3.9 meq/L (ref 3.5–5.3)
Sodium: 133 mEq/L — ABNORMAL LOW (ref 135–145)

## 2014-07-11 NOTE — Progress Notes (Signed)
Internal Medicine Clinic Attending  Case discussed with Dr. Qureshi soon after the resident saw the patient.  We reviewed the resident's history and exam and pertinent patient test results.  I agree with the assessment, diagnosis, and plan of care documented in the resident's note. 

## 2014-07-20 ENCOUNTER — Encounter: Payer: Self-pay | Admitting: Internal Medicine

## 2014-07-26 ENCOUNTER — Ambulatory Visit (HOSPITAL_COMMUNITY)
Admission: RE | Admit: 2014-07-26 | Discharge: 2014-07-26 | Disposition: A | Discharge status: Home / Self Care | Payer: Self-pay | Source: Ambulatory Visit | Attending: Family Medicine | Admitting: Family Medicine

## 2014-07-26 ENCOUNTER — Encounter: Payer: Self-pay | Admitting: Internal Medicine

## 2014-07-26 ENCOUNTER — Ambulatory Visit (INDEPENDENT_AMBULATORY_CARE_PROVIDER_SITE_OTHER): Payer: Self-pay | Admitting: Internal Medicine

## 2014-07-26 VITALS — BP 160/78 | HR 128 | Temp 98.3°F | Ht 64.5 in | Wt 211.3 lb

## 2014-07-26 DIAGNOSIS — H8109 Meniere's disease, unspecified ear: Secondary | ICD-10-CM

## 2014-07-26 DIAGNOSIS — R000 Tachycardia, unspecified: Secondary | ICD-10-CM | POA: Insufficient documentation

## 2014-07-26 DIAGNOSIS — I10 Essential (primary) hypertension: Secondary | ICD-10-CM | POA: Insufficient documentation

## 2014-07-26 DIAGNOSIS — H8102 Meniere's disease, left ear: Secondary | ICD-10-CM

## 2014-07-26 DIAGNOSIS — E039 Hypothyroidism, unspecified: Secondary | ICD-10-CM

## 2014-07-26 DIAGNOSIS — R002 Palpitations: Secondary | ICD-10-CM | POA: Insufficient documentation

## 2014-07-26 LAB — TSH: TSH: 0.01 u[IU]/mL — AB (ref 0.350–4.500)

## 2014-07-26 MED ORDER — HYDROCHLOROTHIAZIDE 12.5 MG PO TABS: 12.5000 mg | ORAL_TABLET | Freq: Every day | ORAL | Status: DC

## 2014-07-26 MED ORDER — POTASSIUM CHLORIDE ER 10 MEQ PO TBCR: 10.0000 meq | EXTENDED_RELEASE_TABLET | Freq: Every day | ORAL | Status: DC

## 2014-07-26 NOTE — Patient Instructions (Signed)
General Instructions:   Please bring your medicines with you each time you come to clinic.  Medicines may include prescription medications, over-the-counter medications, herbal remedies, eye drops, vitamins, or other pills.   Progress Toward Treatment Goals:  Treatment Goal 07/10/2014  Blood pressure at goal    Self Care Goals & Plans:  Self Care Goal 07/26/2014  Manage my medications take my medicines as prescribed; bring my medications to every visit; refill my medications on time  Eat healthy foods eat more vegetables; eat foods that are low in salt; eat baked foods instead of fried foods  Be physically active take the stairs instead of the elevator; take a walk every day    Please continue taking hydrochlorothiazide 12.5 mg (1 tablet) once a day and potassium 10 mEq (1 pill) once a day. We will have you follow up in the clinic when you return from [place] in January. I have also ordered an echo which you can have performed before or after your trip. If you feel unwell during your trip, please visit a physician there.   We will also make you a referral to see Vestibular Rehab.  Nonspecific Tachycardia Tachycardia is a faster than normal heartbeat (more than 100 beats per minute). In adults, the heart normally beats between 60 and 100 times a minute. A fast heartbeat may be a normal response to exercise or stress. It does not necessarily mean that something is wrong. However, sometimes when your heart beats too fast it may not be able to pump enough blood to the rest of your body. This can result in chest pain, shortness of breath, dizziness, and even fainting. Nonspecific tachycardia means that the specific cause or pattern of your tachycardia is unknown. CAUSES  Tachycardia may be harmless or it may be due to a more serious underlying cause. Possible causes of tachycardia include:  Exercise or exertion.  Fever.  Pain or injury.  Infection.  Loss of body fluids  (dehydration).  Overactive thyroid.  Lack of red blood cells (anemia).  Anxiety and stress.  Alcohol.  Caffeine.  Tobacco products.  Diet pills.  Illegal drugs.  Heart disease. SYMPTOMS  Rapid or irregular heartbeat (palpitations).  Suddenly feeling your heart beating (cardiac awareness).  Dizziness.  Tiredness (fatigue).  Shortness of breath.  Chest pain.  Nausea.  Fainting. DIAGNOSIS  Your caregiver will perform a physical exam and take your medical history. In some cases, a heart specialist (cardiologist) may be consulted. Your caregiver may also order:  Blood tests.  Electrocardiography. This test records the electrical activity of your heart.  A heart monitoring test. TREATMENT  Treatment will depend on the likely cause of your tachycardia. The goal is to treat the underlying cause of your tachycardia. Treatment methods may include:  Replacement of fluids or blood through an intravenous (IV) tube for moderate to severe dehydration or anemia.  New medicines or changes in your current medicines.  Diet and lifestyle changes.  Treatment for certain infections.  Stress relief or relaxation methods. HOME CARE INSTRUCTIONS   Rest.  Drink enough fluids to keep your urine clear or pale yellow.  Do not smoke.  Avoid:  Caffeine.  Tobacco.  Alcohol.  Chocolate.  Stimulants such as over-the-counter diet pills or pills that help you stay awake.  Situations that cause anxiety or stress.  Illegal drugs such as marijuana, phencyclidine (PCP), and cocaine.  Only take medicine as directed by your caregiver.  Keep all follow-up appointments as directed by your caregiver. SEEK  IMMEDIATE MEDICAL CARE IF:   You have pain in your chest, upper arms, jaw, or neck.  You become weak, dizzy, or feel faint.  You have palpitations that will not go away.  You vomit, have diarrhea, or pass blood in your stool.  Your skin is cool, pale, and wet.  You  have a fever that will not go away with rest, fluids, and medicine. MAKE SURE YOU:   Understand these instructions.  Will watch your condition.  Will get help right away if you are not doing well or get worse. Document Released: 10/02/2004 Document Revised: 11/17/2011 Document Reviewed: 08/05/2011 [facility] Patient Information 2015 [place], [place]. This information is not intended to replace advice given to you by your health care provider. Make sure you discuss any questions you have with your health care provider.  Meniere Disease Meniere disease is an inner ear disorder. It causes attacks of a spinning sensation (vertigo) and ringing in the ear (tinnitus). It also causes hearing loss and a sensation of fullness or pressure in your ear.  Meniere disease is lifelong. It may get worse over time. Symptoms usually begin in one ear but may eventually affect both ears.  CAUSES Meniere disease is caused by having too much of the fluid that is in your inner ear (endolymph). When endolymph builds up in your inner ear, it affects the nerves that control balance and hearing. The reason for the endolymph buildup is not known. Possible causes include:  Allergy.  An abnormal reaction of the body's defense system (autoimmune disease).  Viral infection of the inner ear.  Head injury. RISK FACTORS  Age older than 40 years.  Family history of Meniere disease.  History of autoimmune disease.  History of migraine headaches. SIGNS AND SYMPTOMS Symptoms of Meniere disease can come and go and may last for up to 4 hours at a time. Symptoms usually start in one ear and may become more frequent and eventually involve both ears. Symptoms can include:  Fullness and pressure in your ear.  Roaring or ringing in your ear.  Vertigo and loss of balance.  Decreased hearing.  Nausea and vomiting. DIAGNOSIS Your health care provider will perform a physical exam. Tests may be done to confirm a diagnosis of  Meniere disease. These tests may include:  A hearing test (audiogram).  An electronystagmogram. This tests your balance nerve (vestibular nerve).  Imaging studies, such as CT or MRI, of your inner ear. TREATMENT There is no cure for Meniere disease, but it can be managed. Management may include:  A diet that may help relieve symptoms of Meniere disease.  Use of medicines to reduce:  Vertigo.  Nausea.  Fluid retention.  Use of an air pressure pulse generator. This is a machine that sends small pressure pulses into your ear canal.  Inner ear surgery (rare). When you experience symptoms, it can be helpful to lie down on a flat surface and focus your eyes on one object that does not move. Try to stay in that position until your symptoms go away.  HOME CARE INSTRUCTIONS   Take medicines only as directed by your health care provider.  Eat the same amount of food at the same time every day, including snacks.  Do not skip meals.  Limit the salt in your diet to 1,000 mg a day.  Avoid caffeine.  Limit alcoholic drinks to one drink a day.  Do not eat foods containing monosodium glutamate (MSG).  Drink enough fluids to keep your urine  clear or pale yellow.  Do not use any tobacco products including cigarettes, chewing tobacco, or electronic cigarettes. If you need help quitting, ask your health care provider.  Find ways to reduce or avoid stress. SEEK MEDICAL CARE IF:   You have symptoms that last longer than 4 hours.  You have new or more severe symptoms. SEEK IMMEDIATE MEDICAL CARE IF:   You have been vomiting for 24 hours.  You are not able to keep fluids down.  You have chest pain or trouble breathing. Document Released: 08/22/2000 Document Revised: 01/09/2014 Document Reviewed: 08/08/2013 [facility] Patient Information 2015 [place], [place]. This information is not intended to replace advice given to you by your health care provider. Make sure you discuss any questions  you have with your health care provider.

## 2014-07-26 NOTE — Progress Notes (Signed)
Internal Medicine Clinic Attending  I saw and evaluated the patient.  I personally confirmed the key portions of the history and exam documented by Dr. Richardson and I reviewed pertinent patient test results.  The assessment, diagnosis, and plan were formulated together and I agree with the documentation in the resident's note. 

## 2014-07-26 NOTE — Progress Notes (Signed)
Subjective:     [name], female   DOB: [birth year], 28 y.o.   MRN: [id number]  HPI [name] is a 28 yo female with PMHx of HTN, Hypothyroidism s/p thyroid ablation for Grave's disease, anxiety and iron deficiency anemia who presents to clinic for follow up. Please see problem based documentation for assessment and plan.  Review of Systems General: Admits to diaphoresis. Denies fever, chills, fatigue, change in appetite.Marland Kitchen  Respiratory: Denies SOB, cough, DOE, chest tightness, and wheezing.   Cardiovascular: Admits to palpitations. Denies chest pain.  Gastrointestinal: Denies nausea, vomiting, abdominal pain, diarrhea, constipation, blood in stool and abdominal distention.  Genitourinary: Denies dysuria, urgency, frequency, hematuria, suprapubic pain and flank pain. Endocrine: Denies hot or cold intolerance, polyuria, and polydipsia. Musculoskeletal: Denies myalgias, back pain, joint swelling, arthralgias and gait problem.  Skin: Denies pallor, rash and wounds.  Neurological: Admits to dizziness. Denies headaches, weakness, lightheadedness, numbness, seizures, and syncope. Psychiatric/Behavioral: Denies mood changes, confusion, nervousness, sleep disturbance and agitation.    Objective:   Physical Exam Filed Vitals:   07/26/14 1518  BP: 160/78  Pulse: 128  Temp: 98.3 F (36.8 C)  TempSrc: Oral  Height: 5' 4.5" (1.638 m)  Weight: 211 lb 4.8 oz (95.845 kg)  SpO2: 100%   General: Vital signs reviewed.  Patient is well-developed and well-nourished, in no acute distress and cooperative with exam.  Neck: No thyromegaly or carotid bruit present.  Cardiovascular: Tachycardic, S1 normal, S2 normal, no murmurs, gallops, or rubs. Pulmonary/Chest: Clear to auscultation bilaterally, no wheezes, rales, or rhonchi. Abdominal: Soft, non-tender, non-distended, BS +, no masses, organomegaly, or guarding present.  Musculoskeletal: No joint deformities, erythema, or stiffness, ROM  full and nontender. Extremities: No lower extremity edema bilaterally,  pulses symmetric and intact bilaterally. No cyanosis or clubbing. Skin: Warm, dry and intact. No rashes or erythema. Psychiatric: Normal mood and affect. speech and behavior is normal. Cognition and memory are normal.     Assessment:         Plan:     Please see problem based assessment and plan.

## 2014-07-27 MED ORDER — LEVOTHYROXINE SODIUM 50 MCG PO TABS: 50.0000 ug | ORAL_TABLET | Freq: Every day | ORAL | Status: DC

## 2014-07-27 NOTE — Assessment & Plan Note (Addendum)
Patient is complaining of palpitaitons, tachycardia, diaphoresis and jitters. On physical exam, patient is tachycardic and hypertensive. EKG showed sinus tachycardia. Patient has been on synthroid 88 mcg daily. TSH was wnl in June 2015.   TSH level today 0.010 indicating hyperthyroidism.  Plan: Decrease synthroid to 50 mcg daily. I have called patient to update her of this change and left her a message. I changed her prescription with her pharmacy. Patient will need a TSH recheck ASAP in January when she returns back from [place].  Addendum: Due to palpitation and tachycardia, we obtained an echocardiogram which revealed: The cavity size was normal. Wall thickness was normal. Systolic function was normal. The estimated ejection fraction was in the range of 55% to 60%. Wall motion was normal; there were no regional wall motion abnormalities. Due to tachycardia, there was fusion of early and atrial contributions to ventricular filling.  Given low TSH, tachycardia and palpitations likely due to hyperthyroidism. See above for changes.   Ref Range 2wk ago    PRA LC/MS/MS 0.25 - 5.82 ng/mL/h 3.48   ALDO / PRA Ratio 0.9 - 28.9 Ratio 3.2   Aldosterone ng/dL 11

## 2014-07-27 NOTE — Assessment & Plan Note (Signed)
Patient continues to have intermittent vertigo after sitting on her couch for long periods of time and occasional vertigo and unsteadiness when walking.  Plan: Referral to Vestibular Rehab.

## 2014-07-27 NOTE — Assessment & Plan Note (Signed)
BP Readings from Last 3 Encounters:  07/26/14 160/78  07/10/14 122/77  05/24/14 122/86    Lab Results  Component Value Date   NA 133* 07/10/2014   K 3.9 07/10/2014   CREATININE 0.51 07/10/2014    Assessment: Blood pressure control:  Uncontrolled Progress toward BP goal:   Deteriorated Comments: Patient recently taken off of HCTZ 12.5 mg daily and K supplement.  Plan: Medications:  Restart HCTZ 12.5 mg daily and K supplementation Educational resources provided:  None Self management tools provided:  None Other plans: Patient will return in January when she returns from [place] for BP recheck

## 2014-07-28 ENCOUNTER — Ambulatory Visit (HOSPITAL_COMMUNITY)
Admission: RE | Admit: 2014-07-28 | Discharge: 2014-07-28 | Disposition: A | Discharge status: Home / Self Care | Payer: Self-pay | Source: Ambulatory Visit | Attending: Oncology | Admitting: Oncology

## 2014-07-28 DIAGNOSIS — R002 Palpitations: Secondary | ICD-10-CM | POA: Insufficient documentation

## 2014-07-28 DIAGNOSIS — R000 Tachycardia, unspecified: Secondary | ICD-10-CM

## 2014-07-28 DIAGNOSIS — I10 Essential (primary) hypertension: Secondary | ICD-10-CM | POA: Insufficient documentation

## 2014-07-28 NOTE — Progress Notes (Signed)
Echocardiogram 2D Echocardiogram has been performed.  Alyssa Hall 07/28/2014, 1:14 PM

## 2014-08-03 LAB — ALDOSTERONE + RENIN ACTIVITY W/ RATIO
ALDO / PRA Ratio: 3.2 Ratio (ref 0.9–28.9)
Aldosterone: 11 ng/dL
PRA LC/MS/MS: 3.48 ng/mL/h (ref 0.25–5.82)

## 2014-08-16 ENCOUNTER — Encounter: Payer: Self-pay | Admitting: Internal Medicine

## 2014-10-11 ENCOUNTER — Telehealth: Payer: Self-pay | Admitting: Internal Medicine

## 2014-10-11 NOTE — Telephone Encounter (Signed)
Called patient to inform her that her TSH was low and she was likely taking to much Synthroid which was causing her palpitations. [place] and I had sent her in a new rx for Synthroid 50 mcg instead of 88 mcg.  I left message instructing patient to call the Internal Medicine Clinic at [facility] to schedule a follow up appointment to recheck TSH.

## 2014-10-19 ENCOUNTER — Telehealth: Payer: Self-pay | Admitting: Internal Medicine

## 2014-10-19 NOTE — Telephone Encounter (Signed)
Call to patient to confirm appointment for 2/12 at 2:15 lmtcb

## 2014-10-20 ENCOUNTER — Ambulatory Visit: Payer: Self-pay | Admitting: Internal Medicine

## 2014-12-06 ENCOUNTER — Ambulatory Visit (HOSPITAL_COMMUNITY)
Admission: RE | Admit: 2014-12-06 | Discharge: 2014-12-06 | Disposition: A | Discharge status: Home / Self Care | Payer: Self-pay | Source: Ambulatory Visit | Attending: Internal Medicine | Admitting: Internal Medicine

## 2014-12-06 ENCOUNTER — Encounter: Payer: Self-pay | Admitting: Internal Medicine

## 2014-12-06 ENCOUNTER — Ambulatory Visit (INDEPENDENT_AMBULATORY_CARE_PROVIDER_SITE_OTHER): Payer: Self-pay | Admitting: Internal Medicine

## 2014-12-06 VITALS — BP 123/75 | HR 118 | Temp 98.0°F | Wt 210.5 lb

## 2014-12-06 DIAGNOSIS — D259 Leiomyoma of uterus, unspecified: Secondary | ICD-10-CM

## 2014-12-06 DIAGNOSIS — E876 Hypokalemia: Secondary | ICD-10-CM

## 2014-12-06 DIAGNOSIS — I471 Supraventricular tachycardia: Secondary | ICD-10-CM

## 2014-12-06 DIAGNOSIS — I10 Essential (primary) hypertension: Secondary | ICD-10-CM

## 2014-12-06 DIAGNOSIS — N938 Other specified abnormal uterine and vaginal bleeding: Secondary | ICD-10-CM

## 2014-12-06 DIAGNOSIS — J0110 Acute frontal sinusitis, unspecified: Secondary | ICD-10-CM

## 2014-12-06 DIAGNOSIS — D509 Iron deficiency anemia, unspecified: Secondary | ICD-10-CM

## 2014-12-06 DIAGNOSIS — J019 Acute sinusitis, unspecified: Secondary | ICD-10-CM | POA: Insufficient documentation

## 2014-12-06 DIAGNOSIS — E039 Hypothyroidism, unspecified: Secondary | ICD-10-CM

## 2014-12-06 LAB — CBC
HCT: 35.5 % — ABNORMAL LOW (ref 36.0–46.0)
HEMOGLOBIN: 11.6 g/dL — AB (ref 12.0–15.0)
MCH: 23.9 pg — AB (ref 26.0–34.0)
MCHC: 32.7 g/dL (ref 30.0–36.0)
MCV: 73.2 fL — ABNORMAL LOW (ref 78.0–100.0)
MPV: 8.4 fL — ABNORMAL LOW (ref 8.6–12.4)
Platelets: 420 10*3/uL — ABNORMAL HIGH (ref 150–400)
RBC: 4.85 MIL/uL (ref 3.87–5.11)
RDW: 14.7 % (ref 11.5–15.5)
WBC: 6.5 10*3/uL (ref 4.0–10.5)

## 2014-12-06 LAB — BASIC METABOLIC PANEL WITH GFR
BUN: 10 mg/dL (ref 6–23)
CO2: 28 meq/L (ref 19–32)
Calcium: 9.5 mg/dL (ref 8.4–10.5)
Chloride: 100 mEq/L (ref 96–112)
Creat: 0.63 mg/dL (ref 0.50–1.10)
GFR, Est African American: >89 mL/min
GFR, Est Non African American: >89 mL/min
GLUCOSE: 89 mg/dL (ref 70–99)
Potassium: 4.2 mEq/L (ref 3.5–5.3)
Sodium: 137 mEq/L (ref 135–145)

## 2014-12-06 LAB — TSH

## 2014-12-06 LAB — T4, FREE: Free T4: 2.3 ng/dL — ABNORMAL HIGH (ref 0.80–1.80)

## 2014-12-06 MED ORDER — FLUTICASONE PROPIONATE 50 MCG/ACT NA SUSP: 2.0000 | Freq: Every day | NASAL | Status: DC

## 2014-12-06 MED ORDER — LEVOTHYROXINE SODIUM 50 MCG PO TABS: 50.0000 ug | ORAL_TABLET | Freq: Every day | ORAL | Status: DC

## 2014-12-06 MED ORDER — ATENOLOL 25 MG PO TABS: 25.0000 mg | ORAL_TABLET | Freq: Every day | ORAL | Status: DC

## 2014-12-06 NOTE — Assessment & Plan Note (Signed)
Patient has persistent ectopic atrial tachycardia with HR at 118 today. This was confirmed on repeat EKG today. Patient's over suppression of TSH may be causing or exacerbating her tachycardia. She denies any chest pain, shortness of breath, palpitations, dizziness of lightheadedness. It will be important to control patient's tachycardia to prevent future complications including cardiomyopathy.   Plan: -Atenolol 25 mg daily -Decrease Synthroid to 50 mcg daily -Follow up in 2 weeks for evaluation of tachycardia

## 2014-12-06 NOTE — Progress Notes (Signed)
   Subjective:    [name], female    DOB: [birth year], 29 y.o.   MRN: [id number]  HPI [name] is a 29 yo female with PMHx of HTN, hypothyroidism (s/p ablation), Meniere's Disease, Iron-Deficiency Anemia who presents for follow up for hypertension and hypothyroidism. Please see problem based assessment and plan for more information.  Review of Systems General: Denies fatigue, change in appetite and diaphoresis.  Respiratory: Denies SOB, cough Cardiovascular: Admits to palpitations (history of, none currently). Denies chest pain.  Gastrointestinal: Denies nausea, vomiting, abdominal pain, diarrhea, constipation GU: Admits to menstrual cramps. Denies menometrorrhagia.  Neurological: Denies dizziness, headaches, weakness, lightheadedness Psychiatric/Behavioral: Denies mood changes, nervousness, sleep disturbance   Past Medical History  Diagnosis Date  . Thyrotoxicosis   . Atrial tachycardia   . Dizziness   . Hypothyroid   . Depression   . Anxiety   . Meniere's disease    Outpatient Encounter Prescriptions as of 12/06/2014  Medication Sig  . atenolol (TENORMIN) 25 MG tablet Take 1 tablet (25 mg total) by mouth daily.  . ferrous sulfate 325 (65 FE) MG tablet Take 1 tablet (325 mg total) by mouth 3 (three) times daily with meals.  . fluticasone (FLONASE) 50 MCG/ACT nasal spray Place 2 sprays into both nostrils daily.  Marland Kitchen levothyroxine (SYNTHROID, LEVOTHROID) 50 MCG tablet Take 1 tablet (50 mcg total) by mouth daily before breakfast.  . [DISCONTINUED] hydrochlorothiazide (HYDRODIURIL) 12.5 MG tablet Take 1 tablet (12.5 mg total) by mouth daily.  . [DISCONTINUED] levothyroxine (SYNTHROID, LEVOTHROID) 50 MCG tablet Take 1 tablet (50 mcg total) by mouth daily before breakfast.  . [DISCONTINUED] norgestimate-ethinyl estradiol (ORTHO-CYCLEN,SPRINTEC,PREVIFEM) 0.25-35 MG-MCG tablet Take 1 tablet by mouth daily.  . [DISCONTINUED] potassium chloride (K-DUR) 10 MEQ tablet Take 1  tablet (10 mEq total) by mouth daily.      Objective:   Physical Exam Filed Vitals:   12/06/14 1601  BP: 123/75  Pulse: 118  Temp: 98 F (36.7 C)  TempSrc: Oral  Weight: 210 lb 8 oz (95.482 kg)  SpO2: 100%   General: Vital signs reviewed.  Patient is well-developed and well-nourished, in no acute distress and cooperative with exam.  Cardiovascular: Tachycardic, regular rhythm, no murmurs, gallops, or rubs. Pulmonary/Chest: Clear to auscultation bilaterally, no wheezes, rales, or rhonchi. Abdominal: Soft, non-tender, non-distended, BS + Extremities: No lower extremity edema bilaterally Neurological: A&O x3 Skin: Warm, dry and intact. No rashes or erythema. Psychiatric: Normal mood and affect. speech and behavior is normal. Cognition and memory are normal.     Assessment & Plan:   Please see problem based assessment and plan for more information.

## 2014-12-06 NOTE — Assessment & Plan Note (Signed)
Patient continues to be on Synthroid 88 mcg daily, despite change of prescription at the pharmacy. Tachycardia may be secondary to or exacerbated by over-treatment. Last TSH was 0.01 in November 2015. Over-supression of TSH can lead to atrial fibrillation and bone disease.  Plan: -Decrease Synthroid to 50 mcg daily -Recheck TSH in 4-6 weeks to allow Synthroid to reach steady state. -Add Atenolol 25 mg daily to assist with tachycardia

## 2014-12-06 NOTE — Assessment & Plan Note (Signed)
BP Readings from Last 3 Encounters:  12/06/14 123/75  07/26/14 160/78  07/10/14 122/77    Lab Results  Component Value Date   NA 133* 07/10/2014   K 3.9 07/10/2014   CREATININE 0.51 07/10/2014    Assessment: Blood pressure control:  Controlled Progress toward BP goal:   At goal Comments: Patient has been on HCTZ 12.5 mg daily and kdur 10 mEq daily. Given tachycardia, patient would greater benefit from a beta-blocker.   Plan: Medications:  Start Atentolol 25 mg daily. Discontinue HCTZ 12.5 mg and KDur 10 mEq. Other plans: Return in 2 weeks for a follow up appointment for blood pressure and tachycardia.

## 2014-12-06 NOTE — Patient Instructions (Addendum)
General Instructions:  Please bring your medicines with you each time you come to clinic.  Medicines may include prescription medications, over-the-counter medications, herbal remedies, eye drops, vitamins, or other pills.   TAKE SYNTHROID 50 MCG DAILY. I WILL CALL YOU WITH RESULTS.  TAKE ATENOLOL 25 MG DAILY.  STOP TAKING HYDROCHLOROTHIAZIDE 25 MG DAILY. STOP TAKING POTASSIUM.  CALL AND RETURN FOR APPOINTMENT IN 1-2 WEEKS.

## 2014-12-06 NOTE — Assessment & Plan Note (Signed)
Patient complains of nasal congestion for the past 3 days. She denies any fever, chills, cough, sore throat, sinus pressure or tenderness, shortness of breath, nausea, vomiting or diarrhea. Patient is requesting something for nasal congestion. Given HTN and tachycardia, I avoided pseudoephedrine and afrin since they stimulate alpha adrenergic receptors.   Plan: -Flonase 2 sprays each nostril daily

## 2014-12-06 NOTE — Assessment & Plan Note (Signed)
Patient admits to painful cramping after periods. She has a history of fibroids and is requesting a referral back to OB/GYN. She would like her pap smear to be completed there as well. She has her menstrual cycle every month, for 5-6 denies. She denies any heavy or irregular bleeding. She is no long on norgestimate-ethinyl estradiol.  Plan: -Referral to OB/GYN for pap smear and pelvic exam and assistance with painful menses

## 2014-12-07 ENCOUNTER — Telehealth: Payer: Self-pay | Admitting: Internal Medicine

## 2014-12-07 NOTE — Progress Notes (Signed)
Case discussed with Dr. Marvel Plan at time of visit. We reviewed the resident's history and exam and pertinent patient test results. I agree with the assessment, diagnosis, and plan of care documented in the resident's note.  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  Review of current ECG confirms a negative P-wave in II with a regular tachycardia and a uniform P-wave morphology.  Review of the previous ECG also reveals the negative P-wave in II, this the morphology of a normal sinus P-wave can not be assessed.  I agree with lowering the synthroid dose and reassessing.  I also agree with initiation of beta-blocker therapy for her tachycardia to prevent a tachycardic cardiomyopathy, prevent future stimulated atrial tachycardia, and to treat her hypertension.

## 2014-12-07 NOTE — Telephone Encounter (Signed)
Called patient with lab results. She understands to take the Synthroid 50 mcg, not the 88 mcg daily. She understands to take the atenolol 25 mg daily and stop taking HCTZ and potassium. She will call for an appointment and follow up in 2 weeks.

## 2014-12-19 ENCOUNTER — Encounter: Payer: Self-pay | Admitting: Obstetrics and Gynecology

## 2014-12-21 ENCOUNTER — Telehealth: Payer: Self-pay | Admitting: Internal Medicine

## 2014-12-21 NOTE — Telephone Encounter (Signed)
Call to patient to confirm appointment for 12/22/14 at 3:00 lmtcb

## 2014-12-22 ENCOUNTER — Ambulatory Visit: Payer: Self-pay

## 2014-12-26 ENCOUNTER — Telehealth: Payer: Self-pay | Admitting: *Deleted

## 2014-12-26 NOTE — Telephone Encounter (Signed)
Pt called - starting to loose her balance. Feeling light headed for over a year. Occurs usually late in day. Pt forgot to mention to doctor last visit 11/2014.. Off on Wednesday and after 3PM on Friday. Appt made for 01/03/15 10:15AM Dr Arcelia Jew. Instructed pt to call clinic if any change.

## 2015-01-02 ENCOUNTER — Telehealth: Payer: Self-pay | Admitting: Internal Medicine

## 2015-01-02 NOTE — Telephone Encounter (Signed)
Call to patient to confirm appointment for 01/03/15 at 10:15 lmtcb

## 2015-01-03 ENCOUNTER — Ambulatory Visit (INDEPENDENT_AMBULATORY_CARE_PROVIDER_SITE_OTHER): Payer: Self-pay | Admitting: Internal Medicine

## 2015-01-03 ENCOUNTER — Encounter: Payer: Self-pay | Admitting: Internal Medicine

## 2015-01-03 ENCOUNTER — Ambulatory Visit (HOSPITAL_COMMUNITY)
Admission: RE | Admit: 2015-01-03 | Discharge: 2015-01-03 | Disposition: A | Discharge status: Home / Self Care | Payer: Self-pay | Source: Ambulatory Visit | Attending: Internal Medicine | Admitting: Internal Medicine

## 2015-01-03 VITALS — BP 131/77 | HR 114 | Temp 97.4°F | Wt 211.4 lb

## 2015-01-03 DIAGNOSIS — F411 Generalized anxiety disorder: Secondary | ICD-10-CM

## 2015-01-03 DIAGNOSIS — R000 Tachycardia, unspecified: Secondary | ICD-10-CM | POA: Insufficient documentation

## 2015-01-03 DIAGNOSIS — I10 Essential (primary) hypertension: Secondary | ICD-10-CM

## 2015-01-03 DIAGNOSIS — I471 Supraventricular tachycardia: Secondary | ICD-10-CM

## 2015-01-03 DIAGNOSIS — H938X3 Other specified disorders of ear, bilateral: Secondary | ICD-10-CM

## 2015-01-03 DIAGNOSIS — H938X9 Other specified disorders of ear, unspecified ear: Secondary | ICD-10-CM | POA: Insufficient documentation

## 2015-01-03 MED ORDER — ATENOLOL 25 MG PO TABS: 50.0000 mg | ORAL_TABLET | Freq: Every day | ORAL | Status: DC

## 2015-01-03 MED ORDER — SERTRALINE HCL 25 MG PO TABS: 25.0000 mg | ORAL_TABLET | Freq: Every day | ORAL | Status: DC

## 2015-01-03 NOTE — Assessment & Plan Note (Signed)
Likely has eustachian tube dysfunction given the ear popping sensation and the feeling of unsteadiness. No vertigo reported and no nystagmus on exam.  - Continue to monitor for now - May need to see ENT if does not resolve within the next few weeks - Recommended to stop cleaning her ears with a bobby pin and instead use warm water

## 2015-01-03 NOTE — Progress Notes (Signed)
   Subjective:    [name], female    DOB: 11-29-1985, 29 y.o.   MRN: [id number]  HPI [name] is a 30yo woman with PMHx of HTN, hypothyroidism, atrial ectopic tachycardia, and generalized anxiety disorder who presents today because her "ears feel full." She states she feels her ears have to "pop." Patient reports this started about 1 year ago and has worsened over the last month. She reports hearing a "scratching sound" in her ear last night. She also notes cleaning her ears frequently with a bobby pin. She reports feeling lightheaded, dizzy, and unsteady on her feet. She notes tenderness in her neck occasionally. She denies any vertigo, syncope, or falls.   Patient also reports increased anxiety lately. She reports having palpitations. She denies chest pain and shortness of breath. She became tearful and stated that her mother and her are not getting along. She reports feeling hopeless at times and not sleeping well.   Review of Systems General: Denies fever, chills, night sweats, changes in weight, changes in appetite HEENT: Denies headaches, ear pain, changes in vision, rhinorrhea, sore throat CV: Denies orthopnea Pulm: Denies cough, wheezing GI: Denies abdominal pain, nausea, vomiting, diarrhea, constipation, melena, hematochezia GU: Denies dysuria, hematuria, frequency Msk: Denies muscle cramps, joint pains Neuro: Denies weakness, numbness, tingling Skin: Denies rashes, bruising    Objective:   Physical Exam General: sitting up in chair, tearful at times HEENT: [place]/AT, EOMI, no nystagmus, sclera anicteric. Ear canals appear erythematous bilaterally. Tympanic membranes appear normal bilaterally. Mucus membranes moist Neck: supple, no JVD. No tenderness to palpation. No limitations in ROM.  CV: tachycardic in 110s, no m/g/r Pulm: CTA bilaterally, breaths non-labored Abd: BS+, soft, non-tender Ext: warm, no edema Neuro: alert and oriented x 3, no focal deficits. Gait  appears normal. Slight swaying during Romberg testing.   Assessment & Plan:  Please refer to A&P documentation.

## 2015-01-03 NOTE — Assessment & Plan Note (Signed)
EKG performed today confirming atrial ectropic tachycardia. Likely reason for patient's palpitations as well as her symptoms of lightheadedness and dizziness.  - Increase Atenolol to 50 mg daily - Referral to cardiology once her orange card is renewed

## 2015-01-03 NOTE — Patient Instructions (Signed)
-   Increase Atenolol to 50 mg daily - Start taking Zoloft 25 mg daily for anxiety/depression. This medication will take 2-3 weeks before it will start to help your symptoms.  - We will place the referral to cardiology once your orange card information and renewal is complete - If your ear fullness does not get better you made need to be seen by an ear, nose, and throat doctor (ENT)  General Instructions:   Please bring your medicines with you each time you come to clinic.  Medicines may include prescription medications, over-the-counter medications, herbal remedies, eye drops, vitamins, or other pills.   Progress Toward Treatment Goals:  Treatment Goal 07/10/2014  Blood pressure at goal    Self Care Goals & Plans:  Self Care Goal 12/06/2014  Manage my medications take my medicines as prescribed; bring my medications to every visit; refill my medications on time  Eat healthy foods eat more vegetables; eat foods that are low in salt; eat baked foods instead of fried foods  Be physically active find an activity I enjoy; take a walk every day    No flowsheet data found.   Care Management & Community Referrals:  No flowsheet data found.

## 2015-01-03 NOTE — Assessment & Plan Note (Signed)
BP Readings from Last 3 Encounters:  01/03/15 131/77  12/06/14 123/75  07/26/14 160/78    Lab Results  Component Value Date   NA 137 12/06/2014   K 4.2 12/06/2014   CREATININE 0.63 12/06/2014    Assessment: Blood pressure control: controlled Progress toward BP goal:  at goal Comments: BP well controlled.   Plan: Medications: Increase Atenolol to 50 mg daily due to atrial ectopic tachycardia (see separate note)

## 2015-01-03 NOTE — Assessment & Plan Note (Signed)
Patient with significant symptoms of both anxiety and depression. We discussed starting an SSRI (she has been on Prozac in the past) and has agreed to try Zoloft. I educated her on the risks and benefits of starting this medication, including the possible risk of worsening her symptoms initially and even increased risk of suicide. I informed her to stop taking the medication and to call 911 if she has feelings of harming herself or others. I also explained that it can take 2-3 weeks before the medication will take full effect. She verbalized understanding.  - Start Zoloft 25 mg daily. Titrate up as necessary in 2-3 weeks based on response.

## 2015-01-04 NOTE — Progress Notes (Signed)
INTERNAL MEDICINE TEACHING ATTENDING ADDENDUM - Blayklee Mable, MD: I reviewed and discussed at the time of visit with the resident Dr. Rivet, the patient's medical history, physical examination, diagnosis and results of pertinent tests and treatment and I agree with the patient's care as documented.  

## 2015-01-05 ENCOUNTER — Ambulatory Visit: Payer: Self-pay | Admitting: Internal Medicine

## 2015-01-09 ENCOUNTER — Ambulatory Visit: Payer: Self-pay

## 2015-01-22 ENCOUNTER — Ambulatory Visit: Payer: Self-pay

## 2015-01-23 ENCOUNTER — Telehealth: Payer: Self-pay | Admitting: Internal Medicine

## 2015-01-23 NOTE — Telephone Encounter (Signed)
Call to patient to confirm appointment for 01/24/15 at 10:30 lmtcb

## 2015-01-24 ENCOUNTER — Ambulatory Visit: Payer: Self-pay

## 2015-01-24 ENCOUNTER — Encounter: Payer: Self-pay | Admitting: Obstetrics and Gynecology

## 2015-01-30 NOTE — Addendum Note (Signed)
Addended by: Hulan Fray on: 01/30/2015 06:57 AM   Modules accepted: Orders

## 2015-02-06 ENCOUNTER — Ambulatory Visit (INDEPENDENT_AMBULATORY_CARE_PROVIDER_SITE_OTHER): Payer: Self-pay | Admitting: *Deleted

## 2015-02-06 DIAGNOSIS — Z0000 Encounter for general adult medical examination without abnormal findings: Secondary | ICD-10-CM

## 2015-02-08 ENCOUNTER — Encounter (HOSPITAL_COMMUNITY): Payer: Self-pay | Admitting: Neurology

## 2015-02-08 ENCOUNTER — Emergency Department (HOSPITAL_COMMUNITY)
Admission: EM | Admit: 2015-02-08 | Discharge: 2015-02-08 | Disposition: A | Discharge status: Home / Self Care | Payer: Self-pay | Attending: Emergency Medicine | Admitting: Emergency Medicine

## 2015-02-08 ENCOUNTER — Emergency Department (HOSPITAL_COMMUNITY): Payer: Self-pay

## 2015-02-08 ENCOUNTER — Other Ambulatory Visit: Payer: Self-pay

## 2015-02-08 DIAGNOSIS — Z3202 Encounter for pregnancy test, result negative: Secondary | ICD-10-CM | POA: Insufficient documentation

## 2015-02-08 DIAGNOSIS — F329 Major depressive disorder, single episode, unspecified: Secondary | ICD-10-CM | POA: Insufficient documentation

## 2015-02-08 DIAGNOSIS — R531 Weakness: Secondary | ICD-10-CM | POA: Insufficient documentation

## 2015-02-08 DIAGNOSIS — Z111 Encounter for screening for respiratory tuberculosis: Secondary | ICD-10-CM

## 2015-02-08 DIAGNOSIS — F419 Anxiety disorder, unspecified: Secondary | ICD-10-CM | POA: Insufficient documentation

## 2015-02-08 DIAGNOSIS — Z8679 Personal history of other diseases of the circulatory system: Secondary | ICD-10-CM | POA: Insufficient documentation

## 2015-02-08 DIAGNOSIS — R55 Syncope and collapse: Secondary | ICD-10-CM | POA: Insufficient documentation

## 2015-02-08 DIAGNOSIS — Z8669 Personal history of other diseases of the nervous system and sense organs: Secondary | ICD-10-CM | POA: Insufficient documentation

## 2015-02-08 DIAGNOSIS — E039 Hypothyroidism, unspecified: Secondary | ICD-10-CM | POA: Insufficient documentation

## 2015-02-08 DIAGNOSIS — Z79899 Other long term (current) drug therapy: Secondary | ICD-10-CM | POA: Insufficient documentation

## 2015-02-08 LAB — BASIC METABOLIC PANEL
ANION GAP: 10 (ref 5–15)
BUN: 8 mg/dL (ref 6–20)
CHLORIDE: 103 mmol/L (ref 101–111)
CO2: 22 mmol/L (ref 22–32)
CREATININE: 0.85 mg/dL (ref 0.44–1.00)
Calcium: 8.9 mg/dL (ref 8.9–10.3)
GFR calc Af Amer: >60 mL/min (ref >60)
Glucose, Bld: 88 mg/dL (ref 65–99)
POTASSIUM: 3.8 mmol/L (ref 3.5–5.1)
SODIUM: 135 mmol/L (ref 135–145)

## 2015-02-08 LAB — I-STAT BETA HCG BLOOD, ED (MC, WL, AP ONLY): I-stat hCG, quantitative: <5 m[IU]/mL (ref <5)

## 2015-02-08 LAB — I-STAT TROPONIN, ED: Troponin i, poc: 0 ng/mL (ref 0.00–0.08)

## 2015-02-08 LAB — CBC
HCT: 36 % (ref 36.0–46.0)
Hemoglobin: 11.6 g/dL — ABNORMAL LOW (ref 12.0–15.0)
MCH: 23.6 pg — ABNORMAL LOW (ref 26.0–34.0)
MCHC: 32.2 g/dL (ref 30.0–36.0)
MCV: 73.3 fL — ABNORMAL LOW (ref 78.0–100.0)
Platelets: 371 10*3/uL (ref 150–400)
RBC: 4.91 MIL/uL (ref 3.87–5.11)
RDW: 14.7 % (ref 11.5–15.5)
WBC: 6.1 10*3/uL (ref 4.0–10.5)

## 2015-02-08 LAB — TB SKIN TEST
Induration: 0 mm
TB SKIN TEST: NEGATIVE

## 2015-02-08 MED ORDER — SODIUM CHLORIDE 0.9 % IV SOLN
1000.0000 mL | Freq: Once | INTRAVENOUS | Status: AC
  Administered 2015-02-08: 1000 mL via INTRAVENOUS

## 2015-02-08 MED ORDER — SODIUM CHLORIDE 0.9 % IV SOLN: 1000.0000 mL | INTRAVENOUS | Status: DC

## 2015-02-08 MED ORDER — ACETAMINOPHEN 325 MG PO TABS
650.0000 mg | ORAL_TABLET | Freq: Once | ORAL | Status: AC
  Administered 2015-02-08: 650 mg via ORAL
  Filled 2015-02-08: qty 2

## 2015-02-08 NOTE — ED Notes (Signed)
Per EMS- pt reports today she was work at [employer] when she felt her eyes get wet and blurry. Also felt like her heart was racing. 1 month ago had thyroid medicine reduced and since then has had back pain, nausea. Has anxiety. BP 155/102, HR 104, 100% RA, CBG 104.

## 2015-02-08 NOTE — Discharge Instructions (Signed)
Near-Syncope Near-syncope (commonly known as near fainting) is sudden weakness, dizziness, or feeling like you might pass out. During an episode of near-syncope, you may also develop pale skin, have tunnel vision, or feel sick to your stomach (nauseous). Near-syncope may occur when getting up after sitting or while standing for a long time. It is caused by a sudden decrease in blood flow to the brain. This decrease can result from various causes or triggers, most of which are not serious. However, because near-syncope can sometimes be a sign of something serious, a medical evaluation is required. The specific cause is often not determined. HOME CARE INSTRUCTIONS  Monitor your condition for any changes. The following actions may help to alleviate any discomfort you are experiencing:  Have someone stay with you until you feel stable.  Lie down right away and prop your feet up if you start feeling like you might faint. Breathe deeply and steadily. Wait until all the symptoms have passed. Most of these episodes last only a few minutes. You may feel tired for several hours.   Drink enough fluids to keep your urine clear or pale yellow.   If you are taking blood pressure or heart medicine, get up slowly when seated or lying down. Take several minutes to sit and then stand. This can reduce dizziness.  Follow up with your health care provider as directed. SEEK IMMEDIATE MEDICAL CARE IF:   You have a severe headache.   You have unusual pain in the chest, abdomen, or back.   You are bleeding from the mouth or rectum, or you have black or tarry stool.   You have an irregular or very fast heartbeat.   You have repeated fainting or have seizure-like jerking during an episode.   You faint when sitting or lying down.   You have confusion.   You have difficulty walking.   You have severe weakness.   You have vision problems.  MAKE SURE YOU:   Understand these instructions.  Will  watch your condition.  Will get help right away if you are not doing well or get worse. Document Released: 08/25/2005 Document Revised: 08/30/2013 Document Reviewed: 01/28/2013 [facility] Patient Information 2015 [place], [place]. This information is not intended to replace advice given to you by your health care provider. Make sure you discuss any questions you have with your health care provider.  Holter Monitoring A Holter monitor is a small device with electrodes (small sticky patches) that attach to your chest. It records the electrical activity of your heart and is worn continuously for 24-48 hours.  A HOLTER MONITOR IS USED TO  Detect heart problems such as:  Heart arrhythmia. Is an abnormal or irregular heartbeat. With some heart arrhythmias, you may not feel or know that you have an irregular heart rhythm.  Palpitations, such as feeling your heart racing or fluttering. It is possible to have heart palpitations and not have a heart arrhythmia.  A heart rhythm that is too slow or too fast.  If you have problems fainting, near fainting or feeling light-headed, a Holter monitor may be worn to see if your heart is the cause. HOLTER MONITOR PREPARATION   Electrodes will be attached to the skin on your chest.  If you have hair on your chest, small areas may have to be shaved. This is done to help the patches stick better and make the recording more accurate.  The electrodes are attached by wires to the Holter monitor. The Holter monitor clips to  your clothing. You will wear the monitor at all times, even while exercising and sleeping. HOME CARE INSTRUCTIONS   Wear your monitor at all times.  The wires and the monitor must stay dry. Do not get the monitor wet.  Do not bathe, swim or use a hot tub with it on.  You may do a "sponge" bath while you have the monitor on.  Keep your skin clean, do not put body lotion or moisturizer on your chest.  It's possible that your skin under the  electrodes could become irritated. To keep this from happening, you may put the electrodes in slightly different places on your chest.  Your caregiver will also ask you to keep a diary of your activities, such as walking or doing chores. Be sure to note what you are doing if you experience heart symptoms such as palpitations. This will help your caregiver determine what might be contributing to your symptoms. The information stored in your monitor will be reviewed by your caregiver alongside your diary entries.  Make sure the monitor is safely clipped to your clothing or in a location close to your body that your caregiver recommends.  The monitor and electrodes are removed when the test is over. Return the monitor as directed.  Be sure to follow up with your caregiver and discuss your Holter monitor results. SEEK IMMEDIATE MEDICAL CARE IF:  You faint or feel lightheaded.  You have trouble breathing.  You get pain in your chest, upper arm or jaw.  You feel sick to your stomach and your skin is pale, cool, or damp.  You think something is wrong with the way your heart is beating. MAKE SURE YOU:   Understand these instructions.  Will watch your condition.  Will get help right away if you are not doing well or get worse. Document Released: 05/23/2004 Document Revised: 11/17/2011 Document Reviewed: 10/05/2008 [facility] Patient Information 2015 [place], [place]. This information is not intended to replace advice given to you by your health care provider. Make sure you discuss any questions you have with your health care provider.

## 2015-02-08 NOTE — ED Provider Notes (Signed)
CSN: [id number]     Arrival date & time 02/08/15  1700 History   First MD Initiated Contact with Patient 02/08/15 1706     Chief Complaint  Patient presents with  . Palpitations    Patient is a 29 y.o. female presenting with palpitations. The history is provided by the patient.  Palpitations Palpitations quality:  Fast Onset quality:  Sudden Duration:  5 minutes Timing:  Constant Progression:  Resolved Chronicity:  New Context comment:  Pt was at work when she felt that her vision became blurry and her heart was racing.  She thinks this lasted a few minutes.  She did have brief LOC. Relieved by:  Nothing Worsened by:  Nothing Associated symptoms: syncope and weakness   Associated symptoms: no back pain, no nausea, no numbness and no vomiting   Risk factors: hx of thyroid disease and hyperthyroidism   Risk factors: no hx of DVT and no hx of PE     Past Medical History  Diagnosis Date  . Thyrotoxicosis   . Atrial tachycardia   . Dizziness   . Hypothyroid   . Depression   . Anxiety   . Meniere's disease    History reviewed. No pertinent past surgical history. No family history on file. History  Substance Use Topics  . Smoking status: Never Smoker   . Smokeless tobacco: Never Used  . Alcohol Use: No   OB History    Gravida Para Term Preterm AB TAB SAB Ectopic Multiple Living   1     0         Review of Systems  Cardiovascular: Positive for palpitations and syncope.  Gastrointestinal: Negative for nausea and vomiting.  Musculoskeletal: Negative for back pain.  Neurological: Positive for weakness. Negative for numbness.  All other systems reviewed and are negative.     Allergies  Review of patient's allergies indicates no known allergies.  Home Medications   Prior to Admission medications   Medication Sig Start Date End Date Taking? Authorizing Provider  atenolol (TENORMIN) 25 MG tablet Take 2 tablets (50 mg total) by mouth daily. 01/03/15   Juliet Rude, MD   ferrous sulfate 325 (65 FE) MG tablet Take 1 tablet (325 mg total) by mouth 3 (three) times daily with meals. Patient not taking: Reported on 01/03/2015 07/10/14   Wilber Oliphant, MD  fluticasone [facility]) 50 MCG/ACT nasal spray Place 2 sprays into both nostrils daily. Patient not taking: Reported on 01/03/2015 12/06/14   Alexa Sherral Hammers, MD  levothyroxine (SYNTHROID, LEVOTHROID) 50 MCG tablet Take 1 tablet (50 mcg total) by mouth daily before breakfast. 12/06/14   Alexa Sherral Hammers, MD  sertraline (ZOLOFT) 25 MG tablet Take 1 tablet (25 mg total) by mouth daily. 01/03/15   Carly J Rivet, MD   BP 113/59 mmHg  Pulse 83  Temp(Src) 99.3 F (37.4 C) (Oral)  Resp 15  SpO2 100%  LMP 02/06/2015 (Exact Date) Physical Exam  Constitutional: She appears well-developed and well-nourished. No distress.  HENT:  Head: Normocephalic and atraumatic.  Right Ear: External ear normal.  Left Ear: External ear normal.  Eyes: Conjunctivae are normal. Right eye exhibits no discharge. Left eye exhibits no discharge. No scleral icterus.  Neck: Neck supple. No tracheal deviation present.  Cardiovascular: Normal rate, regular rhythm and intact distal pulses.   Pulmonary/Chest: Effort normal and breath sounds normal. No stridor. No respiratory distress. She has no wheezes. She has no rales.  Abdominal: Soft. Bowel sounds are normal. She  exhibits no distension. There is no tenderness. There is no rebound and no guarding.  Musculoskeletal: She exhibits no edema or tenderness.  Neurological: She is alert. She has normal strength. No cranial nerve deficit (no facial droop, extraocular movements intact, no slurred speech) or sensory deficit. She exhibits normal muscle tone. She displays no seizure activity. Coordination normal.  Skin: Skin is warm and dry. No rash noted.  Psychiatric: She has a normal mood and affect.  Nursing note and vitals reviewed.   ED Course  Procedures (including critical care time) Labs  Review Labs Reviewed  CBC - Abnormal; Notable for the following:    Hemoglobin 11.6 (*)    MCV 73.3 (*)    MCH 23.6 (*)    All other components within normal limits  BASIC METABOLIC PANEL  I-STAT TROPOININ, ED  I-STAT BETA HCG BLOOD, ED ([facility], [facility], [facility] ONLY)    Imaging Review Dg Chest 2 View  02/08/2015   CLINICAL DATA:  Palpitations with blurred vision, headache and syncopal episode today.  EXAM: CHEST  2 VIEW  COMPARISON:  09/10/2013 and 01/21/2012 radiographs.  FINDINGS: The heart size and mediastinal contours are stable. There is possible mild central airway thickening but no hyperinflation, confluent airspace opacity, edema or pleural effusion. The bones appear stable.  IMPRESSION: Possible mild central airway thickening. No confluent airspace opacity or other significant findings.   Electronically Signed   By: Richardean Sale M.D.   On: 02/08/2015 18:21     EKG Interpretation   Date/Time:  Thursday February 08 2015 17:07:56 EDT Ventricular Rate:  91 PR Interval:  197 QRS Duration: 87 QT Interval:  361 QTC Calculation: 444 R Axis:   58 Text Interpretation:  Sinus rhythm Borderline prolonged PR interval No  significant change since last tracing Confirmed by Clark Clowdus  MD-J, Elleah Hemsley  ([id number]) on 02/08/2015 6:17:26 PM      MDM   Final diagnoses:  Syncope, unspecified syncope type    Possibly vasovagal.  Recommended outpatient follow with pcp.   Considering holter monitor.  At this time there does not appear to be any evidence of an acute emergency medical condition and the patient appears stable for discharge with appropriate outpatient follow up.     Dorie Rank, MD 02/08/15 2031

## 2015-02-12 ENCOUNTER — Ambulatory Visit: Payer: Self-pay

## 2015-02-26 ENCOUNTER — Other Ambulatory Visit: Payer: Self-pay | Admitting: Internal Medicine

## 2015-02-26 DIAGNOSIS — E039 Hypothyroidism, unspecified: Secondary | ICD-10-CM

## 2015-02-26 DIAGNOSIS — I471 Supraventricular tachycardia: Secondary | ICD-10-CM

## 2015-02-26 DIAGNOSIS — I4719 Other supraventricular tachycardia: Secondary | ICD-10-CM

## 2015-02-27 ENCOUNTER — Emergency Department (HOSPITAL_COMMUNITY)
Admission: EM | Admit: 2015-02-27 | Discharge: 2015-02-27 | Disposition: A | Discharge status: Home / Self Care | Payer: Self-pay | Attending: Emergency Medicine | Admitting: Emergency Medicine

## 2015-02-27 ENCOUNTER — Encounter (HOSPITAL_COMMUNITY): Payer: Self-pay | Admitting: *Deleted

## 2015-02-27 DIAGNOSIS — Z79899 Other long term (current) drug therapy: Secondary | ICD-10-CM | POA: Insufficient documentation

## 2015-02-27 DIAGNOSIS — E039 Hypothyroidism, unspecified: Secondary | ICD-10-CM | POA: Insufficient documentation

## 2015-02-27 DIAGNOSIS — Z862 Personal history of diseases of the blood and blood-forming organs and certain disorders involving the immune mechanism: Secondary | ICD-10-CM | POA: Insufficient documentation

## 2015-02-27 DIAGNOSIS — Z8669 Personal history of other diseases of the nervous system and sense organs: Secondary | ICD-10-CM | POA: Insufficient documentation

## 2015-02-27 DIAGNOSIS — R42 Dizziness and giddiness: Secondary | ICD-10-CM | POA: Insufficient documentation

## 2015-02-27 HISTORY — DX: Anemia, unspecified: D64.9

## 2015-02-27 LAB — CBC
HCT: 36.2 % (ref 36.0–46.0)
Hemoglobin: 11.7 g/dL — ABNORMAL LOW (ref 12.0–15.0)
MCH: 23.3 pg — AB (ref 26.0–34.0)
MCHC: 32.3 g/dL (ref 30.0–36.0)
MCV: 72 fL — ABNORMAL LOW (ref 78.0–100.0)
PLATELETS: 368 10*3/uL (ref 150–400)
RBC: 5.03 MIL/uL (ref 3.87–5.11)
RDW: 14.6 % (ref 11.5–15.5)
WBC: 4.9 10*3/uL (ref 4.0–10.5)

## 2015-02-27 LAB — COMPREHENSIVE METABOLIC PANEL
ALBUMIN: 3.8 g/dL (ref 3.5–5.0)
ALT: 13 U/L — ABNORMAL LOW (ref 14–54)
ANION GAP: 7 (ref 5–15)
AST: 15 U/L (ref 15–41)
Alkaline Phosphatase: 53 U/L (ref 38–126)
BILIRUBIN TOTAL: 0.6 mg/dL (ref 0.3–1.2)
BUN: 9 mg/dL (ref 6–20)
CALCIUM: 9.3 mg/dL (ref 8.9–10.3)
CO2: 26 mmol/L (ref 22–32)
CREATININE: 0.68 mg/dL (ref 0.44–1.00)
Chloride: 104 mmol/L (ref 101–111)
GFR calc Af Amer: >60 mL/min (ref >60)
Glucose, Bld: 93 mg/dL (ref 65–99)
Potassium: 4.3 mmol/L (ref 3.5–5.1)
SODIUM: 137 mmol/L (ref 135–145)
Total Protein: 7.6 g/dL (ref 6.5–8.1)

## 2015-02-27 LAB — DIFFERENTIAL
Basophils Absolute: 0 10*3/uL (ref 0.0–0.1)
Basophils Relative: 0 % (ref 0–1)
EOS PCT: 1 % (ref 0–5)
Eosinophils Absolute: 0.1 10*3/uL (ref 0.0–0.7)
LYMPHS ABS: 1.5 10*3/uL (ref 0.7–4.0)
Lymphocytes Relative: 30 % (ref 12–46)
Monocytes Absolute: 0.6 10*3/uL (ref 0.1–1.0)
Monocytes Relative: 13 % — ABNORMAL HIGH (ref 3–12)
NEUTROS ABS: 2.7 10*3/uL (ref 1.7–7.7)
Neutrophils Relative %: 56 % (ref 43–77)

## 2015-02-27 MED ORDER — MECLIZINE HCL 50 MG PO TABS: 50.0000 mg | ORAL_TABLET | Freq: Two times a day (BID) | ORAL | Status: DC | PRN

## 2015-02-27 NOTE — ED Notes (Signed)
Dr. Harrison at the bedside. 

## 2015-02-27 NOTE — Discharge Instructions (Signed)
Benign Positional Vertigo Vertigo means you feel like you or your surroundings are moving when they are not. Benign positional vertigo is the most common form of vertigo. Benign means that the cause of your condition is not serious. Benign positional vertigo is more common in older adults. CAUSES  Benign positional vertigo is the result of an upset in the labyrinth system. This is an area in the middle ear that helps control your balance. This may be caused by a viral infection, head injury, or repetitive motion. However, often no specific cause is found. SYMPTOMS  Symptoms of benign positional vertigo occur when you move your head or eyes in different directions. Some of the symptoms may include:  Loss of balance and falls.  Vomiting.  Blurred vision.  Dizziness.  Nausea.  Involuntary eye movements (nystagmus). DIAGNOSIS  Benign positional vertigo is usually diagnosed by physical exam. If the specific cause of your benign positional vertigo is unknown, your caregiver may perform imaging tests, such as magnetic resonance imaging (MRI) or computed tomography (CT). TREATMENT  Your caregiver may recommend movements or procedures to correct the benign positional vertigo. Medicines such as meclizine, benzodiazepines, and medicines for nausea may be used to treat your symptoms. In rare cases, if your symptoms are caused by certain conditions that affect the inner ear, you may need surgery. HOME CARE INSTRUCTIONS   Follow your caregiver's instructions.  Move slowly. Do not make sudden body or head movements.  Avoid driving.  Avoid operating heavy machinery.  Avoid performing any tasks that would be dangerous to you or others during a vertigo episode.  Drink enough fluids to keep your urine clear or pale yellow. SEEK IMMEDIATE MEDICAL CARE IF:   You develop problems with walking, weakness, numbness, or using your arms, hands, or legs.  You have difficulty speaking.  You develop  severe headaches.  Your nausea or vomiting continues or gets worse.  You develop visual changes.  Your family or friends notice any behavioral changes.  Your condition gets worse.  You have a fever.  You develop a stiff neck or sensitivity to light. MAKE SURE YOU:   Understand these instructions.  Will watch your condition.  Will get help right away if you are not doing well or get worse. Document Released: 06/02/2006 Document Revised: 11/17/2011 Document Reviewed: 05/15/2011 Exit[employer] Patient Information 2015 [employer]. This information is not intended to replace advice given to you by your health care provider. Make sure you discuss any questions you have with your health care provider.    

## 2015-02-27 NOTE — ED Notes (Signed)
Pt reports lying in bed this am and had began feeling dizzy and reports excessive cravings for ice. Hx of anemia. No acute distress noted or neuro deficits noted at triage.

## 2015-02-27 NOTE — Telephone Encounter (Signed)
Patient is calling requesting a refill for atenolol.

## 2015-02-27 NOTE — ED Provider Notes (Signed)
CSN: [id number]     Arrival date & time 02/27/15  0946 History   First MD Initiated Contact with Patient 02/27/15 1126     Chief Complaint  Patient presents with  . Dizziness     (Consider location/radiation/quality/duration/timing/severity/associated sxs/prior Treatment) Patient is a 29 y.o. female presenting with dizziness. The history is provided by the patient.  Dizziness Quality:  Room spinning Severity:  Moderate Onset quality:  Sudden Duration: minutes. Timing:  Intermittent Progression:  Unchanged Chronicity:  Chronic Context comment:  While lying supine Relieved by: rest. Worsened by:  Nothing Ineffective treatments:  Being still Associated symptoms: no chest pain, no diarrhea, no headaches, no nausea, no shortness of breath and no vomiting     Past Medical History  Diagnosis Date  . Thyrotoxicosis   . Atrial tachycardia   . Dizziness   . Hypothyroid   . Depression   . Anxiety   . Meniere's disease   . Anemia    History reviewed. No pertinent past surgical history. History reviewed. No pertinent family history. History  Substance Use Topics  . Smoking status: Never Smoker   . Smokeless tobacco: Never Used  . Alcohol Use: No   OB History    Gravida Para Term Preterm AB TAB SAB Ectopic Multiple Living   1     0         Review of Systems  Constitutional: Negative for fever and fatigue.  HENT: Negative for congestion and drooling.   Eyes: Negative for pain.  Respiratory: Negative for cough and shortness of breath.   Cardiovascular: Negative for chest pain.  Gastrointestinal: Negative for nausea, vomiting, abdominal pain and diarrhea.  Genitourinary: Negative for dysuria and hematuria.  Musculoskeletal: Negative for back pain, gait problem and neck pain.  Skin: Negative for color change.  Neurological: Positive for dizziness. Negative for headaches.  Hematological: Negative for adenopathy.  Psychiatric/Behavioral: Negative for behavioral problems.   All other systems reviewed and are negative.     Allergies  Review of patient's allergies indicates no known allergies.  Home Medications   Prior to Admission medications   Medication Sig Start Date End Date Taking? Authorizing Provider  atenolol (TENORMIN) 25 MG tablet Take 2 tablets (50 mg total) by mouth daily. 01/03/15  Yes Carly Montey Hora, MD  levothyroxine (SYNTHROID, LEVOTHROID) 50 MCG tablet Take 1 tablet (50 mcg total) by mouth daily before breakfast. 12/06/14  Yes Alexa Sherral Hammers, MD  meclizine (ANTIVERT) 50 MG tablet Take 1 tablet (50 mg total) by mouth 2 (two) times daily as needed for dizziness. 02/27/15   Pamella Pert, MD   BP 112/64 mmHg  Pulse 74  Temp(Src) 98.2 F (36.8 C) (Oral)  Resp 17  Ht 5\' 4"  (1.626 m)  Wt 211 lb (95.709 kg)  BMI 36.20 kg/m2  SpO2 100%  LMP 02/06/2015 Physical Exam  Constitutional: She is oriented to person, place, and time. She appears well-developed and well-nourished.  HENT:  Head: Normocephalic and atraumatic.  Mouth/Throat: Oropharynx is clear and moist. No oropharyngeal exudate.  Eyes: Conjunctivae and EOM are normal. Pupils are equal, round, and reactive to light.  Neck: Normal range of motion. Neck supple.  Cardiovascular: Normal rate, regular rhythm, normal heart sounds and intact distal pulses.  Exam reveals no gallop and no friction rub.   No murmur heard. Pulmonary/Chest: Effort normal and breath sounds normal. No respiratory distress. She has no wheezes.  Abdominal: Soft. Bowel sounds are normal. There is no tenderness. There is no rebound and  no guarding.  Musculoskeletal: Normal range of motion. She exhibits no edema or tenderness.  Neurological: She is alert and oriented to person, place, and time.  alert, oriented x3 speech: normal in context and clarity memory: intact grossly cranial nerves II-XII: intact motor strength: full proximally and distally no involuntary movements or tremors sensation: intact to light  touch diffusely  cerebellar: finger-to-nose and heel-to-shin intact gait: normal forwards and backwards  Skin: Skin is warm and dry.  Psychiatric: She has a normal mood and affect. Her behavior is normal.  Nursing note and vitals reviewed.   ED Course  Procedures (including critical care time) Labs Review Labs Reviewed  CBC - Abnormal; Notable for the following:    Hemoglobin 11.7 (*)    MCV 72.0 (*)    MCH 23.3 (*)    All other components within normal limits  DIFFERENTIAL - Abnormal; Notable for the following:    Monocytes Relative 13 (*)    All other components within normal limits  COMPREHENSIVE METABOLIC PANEL - Abnormal; Notable for the following:    ALT 13 (*)    All other components within normal limits    Imaging Review No results found.   EKG Interpretation   Date/Time:  Tuesday February 27 2015 11:41:54 EDT Ventricular Rate:  80 PR Interval:  160 QRS Duration: 92 QT Interval:  384 QTC Calculation: 443 R Axis:   31 Text Interpretation:  Ectopic atrial rhythm Baseline wander in lead(s) V5  Confirmed by Ly Wass  MD, Zalia Hautala ([id number]) on 02/27/2015 12:13:04 PM      MDM   Final diagnoses:  Vertigo    1:15 PM 29 y.o. female with a history of Mnire's disease who presents with sudden onset dizziness which began around 9 AM this morning while lying supine in bed watching a movie. Symptoms lasted several minutes and then spontaneously resolve. She's been a symptomatic since then. She has a history of similar symptoms about once a month for the last year. She notes that the room spins when she has the dizziness. Symptoms usually resolve fairly quickly. She does have a diagnosis of Mnire's disease but did not mention this to me on exam. Her symptoms sound like they could be related to her Mnire's versus BPV. Will prescribe meclizine to be taken on an as-needed basis only. Will recommend follow-up with her doctor.    Pamella Pert, MD 02/27/15 [phone]

## 2015-02-28 ENCOUNTER — Ambulatory Visit: Payer: Self-pay | Admitting: Internal Medicine

## 2015-02-28 ENCOUNTER — Telehealth: Payer: Self-pay | Admitting: Internal Medicine

## 2015-02-28 MED ORDER — LEVOTHYROXINE SODIUM 50 MCG PO TABS: 50.0000 ug | ORAL_TABLET | Freq: Every day | ORAL | Status: DC

## 2015-02-28 NOTE — Telephone Encounter (Signed)
Called patient to discuss her recent visits the ED. Patient states her dizziness and palpitations have improved, but she has anxiety. She is taking her atenolol and synthroid as prescribed. Patient has an appointment with me next Wednesday and she plans to keep that appointment.

## 2015-03-02 ENCOUNTER — Other Ambulatory Visit: Payer: Self-pay | Admitting: Internal Medicine

## 2015-03-05 ENCOUNTER — Other Ambulatory Visit: Payer: Self-pay | Admitting: Internal Medicine

## 2015-03-05 DIAGNOSIS — N938 Other specified abnormal uterine and vaginal bleeding: Secondary | ICD-10-CM

## 2015-03-06 ENCOUNTER — Encounter: Payer: Self-pay | Admitting: Family Medicine

## 2015-03-06 ENCOUNTER — Telehealth: Payer: Self-pay | Admitting: Internal Medicine

## 2015-03-06 NOTE — Telephone Encounter (Signed)
Calling to confirm patients appointment for 03/07/15 at 1:15 lmtcb

## 2015-03-07 ENCOUNTER — Encounter: Payer: Self-pay | Admitting: Internal Medicine

## 2015-03-07 ENCOUNTER — Emergency Department (HOSPITAL_COMMUNITY)
Admission: EM | Admit: 2015-03-07 | Discharge: 2015-03-07 | Disposition: A | Discharge status: Home / Self Care | Payer: Self-pay | Attending: Emergency Medicine | Admitting: Emergency Medicine

## 2015-03-07 DIAGNOSIS — Z8679 Personal history of other diseases of the circulatory system: Secondary | ICD-10-CM | POA: Insufficient documentation

## 2015-03-07 DIAGNOSIS — F419 Anxiety disorder, unspecified: Secondary | ICD-10-CM | POA: Insufficient documentation

## 2015-03-07 DIAGNOSIS — E039 Hypothyroidism, unspecified: Secondary | ICD-10-CM | POA: Insufficient documentation

## 2015-03-07 DIAGNOSIS — E0590 Thyrotoxicosis, unspecified without thyrotoxic crisis or storm: Secondary | ICD-10-CM | POA: Insufficient documentation

## 2015-03-07 DIAGNOSIS — Z79899 Other long term (current) drug therapy: Secondary | ICD-10-CM | POA: Insufficient documentation

## 2015-03-07 DIAGNOSIS — Z8669 Personal history of other diseases of the nervous system and sense organs: Secondary | ICD-10-CM | POA: Insufficient documentation

## 2015-03-07 DIAGNOSIS — Z3202 Encounter for pregnancy test, result negative: Secondary | ICD-10-CM | POA: Insufficient documentation

## 2015-03-07 DIAGNOSIS — Z862 Personal history of diseases of the blood and blood-forming organs and certain disorders involving the immune mechanism: Secondary | ICD-10-CM | POA: Insufficient documentation

## 2015-03-07 DIAGNOSIS — R42 Dizziness and giddiness: Secondary | ICD-10-CM

## 2015-03-07 LAB — I-STAT BETA HCG BLOOD, ED (MC, WL, AP ONLY): I-stat hCG, quantitative: <5 m[IU]/mL (ref <5)

## 2015-03-07 LAB — I-STAT CHEM 8, ED
BUN: 12 mg/dL (ref 6–20)
CHLORIDE: 103 mmol/L (ref 101–111)
Calcium, Ion: 1.25 mmol/L — ABNORMAL HIGH (ref 1.12–1.23)
Creatinine, Ser: 0.8 mg/dL (ref 0.44–1.00)
Glucose, Bld: 90 mg/dL (ref 65–99)
HEMATOCRIT: 36 % (ref 36.0–46.0)
Hemoglobin: 12.2 g/dL (ref 12.0–15.0)
POTASSIUM: 4.2 mmol/L (ref 3.5–5.1)
SODIUM: 139 mmol/L (ref 135–145)
TCO2: 26 mmol/L (ref 0–100)

## 2015-03-07 NOTE — ED Provider Notes (Addendum)
CSN: [id number]     Arrival date & time 03/07/15  1154 History   First MD Initiated Contact with Patient 03/07/15 1259     Chief Complaint  Patient presents with  . Loss of Consciousness     (Consider location/radiation/quality/duration/timing/severity/associated sxs/prior Treatment) Patient is a 29 y.o. female presenting with syncope. The history is provided by the patient.  Loss of Consciousness Associated symptoms: no chest pain, no confusion, no fever, no headaches, no palpitations, no shortness of breath, no vomiting and no weakness   Patient presents s/p near syncopal, ?brief syncopal, episode at work today. Was standing. States became dizzy, lightheaded, and then felt as if may faint.  Notes tingling of bilateral hands and feet at the time ?hyperventilation. Hx anxiety, and hx dizziness ?menieres. Pt intermittently has noted 'fullness' sensation in ears, no ear pain, hearing loss or tinnitus. Currently denies any vertigo or room spinning sensation. No lightheadedness. The tingling in bil hands/feet has also resolved. States compliant w normal meds. No recent change in meds/doses. States is having normal period now. No abn vaginal discharge or bleeding. Pt denies headaches. No cough or uri c/o. No chest pain. Denies palpitations, or sense of rapid or irregular heart beat. No unusual doe. No abd pain. No nvd. No dysuria. No fever or chills.      Past Medical History  Diagnosis Date  . Thyrotoxicosis   . Atrial tachycardia   . Dizziness   . Hypothyroid   . Depression   . Anxiety   . Meniere's disease   . Anemia    No past surgical history on file. No family history on file. History  Substance Use Topics  . Smoking status: Never Smoker   . Smokeless tobacco: Never Used  . Alcohol Use: No   OB History    Gravida Para Term Preterm AB TAB SAB Ectopic Multiple Living   1     0         Review of Systems  Constitutional: Negative for fever and chills.  HENT: Negative for sore  throat.   Eyes: Negative for redness.  Respiratory: Negative for shortness of breath.   Cardiovascular: Positive for syncope. Negative for chest pain, palpitations and leg swelling.  Gastrointestinal: Negative for vomiting, abdominal pain, diarrhea and blood in stool.  Endocrine: Negative for polyuria.  Genitourinary: Negative for dysuria and flank pain.  Musculoskeletal: Negative for back pain and neck pain.  Skin: Negative for rash.  Neurological: Negative for speech difficulty, weakness and headaches.  Hematological: Does not bruise/bleed easily.  Psychiatric/Behavioral: Negative for confusion.      Allergies  Review of patient's allergies indicates no known allergies.  Home Medications   Prior to Admission medications   Medication Sig Start Date End Date Taking? Authorizing Provider  acetaminophen (TYLENOL) 500 MG tablet Take 1,000 mg by mouth every 6 (six) hours as needed for mild pain or moderate pain.   Yes Historical Provider, MD  atenolol (TENORMIN) 25 MG tablet TAKE 1 TABLET BY MOUTH EVERY DAY 02/28/15  Yes Alexa Sherral Hammers, MD  levothyroxine (SYNTHROID, LEVOTHROID) 50 MCG tablet Take 1 tablet (50 mcg total) by mouth daily before breakfast. 02/28/15  Yes Alexa Sherral Hammers, MD  meclizine (ANTIVERT) 50 MG tablet Take 1 tablet (50 mg total) by mouth 2 (two) times daily as needed for dizziness. Patient not taking: Reported on 03/07/2015 02/27/15   Pamella Pert, MD   BP 128/79 mmHg  Pulse 67  Temp(Src) 98.6 F (37 C) (Oral)  Resp  20  Ht 5\' 3"  (1.6 m)  Wt 211 lb (95.709 kg)  BMI 37.39 kg/m2  SpO2 100%  LMP 02/06/2015 Physical Exam  Constitutional: She is oriented to person, place, and time. She appears well-developed and well-nourished. No distress.  HENT:  Mouth/Throat: Oropharynx is clear and moist.  tms normal.   Eyes: Conjunctivae are normal. Pupils are equal, round, and reactive to light. No scleral icterus.  Neck: Normal range of motion. Neck supple. No  tracheal deviation present. No thyromegaly present.  Cardiovascular: Normal rate, regular rhythm, normal heart sounds and intact distal pulses.  Exam reveals no gallop and no friction rub.   No murmur heard. Pulmonary/Chest: Effort normal and breath sounds normal. No respiratory distress.  Abdominal: Soft. Normal appearance and bowel sounds are normal. She exhibits no distension and no mass. There is no tenderness. There is no rebound and no guarding.  Genitourinary:  No cva tenderness  Musculoskeletal: She exhibits no edema or tenderness.  Neurological: She is alert and oriented to person, place, and time.  Motor intact bil. stre 5/5. sens grossly intact. Steady gait.   Skin: Skin is warm and dry. No rash noted. She is not diaphoretic.  Psychiatric: She has a normal mood and affect.  Nursing note and vitals reviewed.   ED Course  Procedures (including critical care time) Labs Review  Results for orders placed or performed during the hospital encounter of 03/07/15  I-Stat beta hCG blood, ED ([facility], [facility], [facility] only)  Result Value Ref Range   I-stat hCG, quantitative <5.0 <5 mIU/mL   Comment 3          I-stat chem 8, ed  Result Value Ref Range   Sodium 139 135 - 145 mmol/L   Potassium 4.2 3.5 - 5.1 mmol/L   Chloride 103 101 - 111 mmol/L   BUN 12 6 - 20 mg/dL   Creatinine, Ser 0.80 0.44 - 1.00 mg/dL   Glucose, Bld 90 65 - 99 mg/dL   Calcium, Ion 1.25 (H) 1.12 - 1.23 mmol/L   TCO2 26 0 - 100 mmol/L   Hemoglobin 12.2 12.0 - 15.0 g/dL   HCT 36.0 36.0 - 46.0 %   Dg Chest 2 View  02/08/2015   CLINICAL DATA:  Palpitations with blurred vision, headache and syncopal episode today.  EXAM: CHEST  2 VIEW  COMPARISON:  09/10/2013 and 01/21/2012 radiographs.  FINDINGS: The heart size and mediastinal contours are stable. There is possible mild central airway thickening but no hyperinflation, confluent airspace opacity, edema or pleural effusion. The bones appear stable.  IMPRESSION: Possible mild  central airway thickening. No confluent airspace opacity or other significant findings.   Electronically Signed   By: Richardean Sale M.D.   On: 02/08/2015 18:21        EKG Interpretation   Date/Time:  Wednesday March 07 2015 11:57:36 EDT Ventricular Rate:  69 PR Interval:  188 QRS Duration: 89 QT Interval:  417 QTC Calculation: 447 R Axis:   36 Text Interpretation:  Sinus rhythm No significant change since last  tracing Confirmed by Ashok Cordia  MD, Lennette Bihari ([id number]) on 03/07/2015 12:03:36 PM      MDM   Monitor. Labs.  Reviewed nursing notes and prior charts for additional history.   hgb c/w baseline.  u preg neg.  Symptoms resolved.  Pt indicates had appt at [facility] at 1:15 - called resident to see if could go to clinic now.  They indicate they will re-schedule pt an appointment in the next  few days.   Pt appears stable for d/c from ED.      Lajean Saver, MD 03/07/15 [phone]

## 2015-03-07 NOTE — Discharge Instructions (Signed)
It was our pleasure to provide your ER care today - we hope that you feel better.  Rest. Drink plenty of fluids.  Today's lab work looks good.  We discussed your case with your doctors in the clinic - they indicate they will re-schedule your appointment for early next week - call the clinic to confirm time of the appointment.   Return to ER if worse, new symptoms, fevers, fainting, trouble breathing, other concern.      Dizziness Dizziness is a common problem. It is a feeling of unsteadiness or light-headedness. You may feel like you are about to faint. Dizziness can lead to injury if you stumble or fall. A person of any age group can suffer from dizziness, but dizziness is more common in older adults. CAUSES  Dizziness can be caused by many different things, including:  Middle ear problems.  Standing for too long.  Infections.  An allergic reaction.  Aging.  An emotional response to something, such as the sight of blood.  Side effects of medicines.  Tiredness.  Problems with circulation or blood pressure.  Excessive use of alcohol or medicines, or illegal drug use.  Breathing too fast (hyperventilation).  An irregular heart rhythm (arrhythmia).  A low red blood cell count (anemia).  Pregnancy.  Vomiting, diarrhea, fever, or other illnesses that cause body fluid loss (dehydration).  Diseases or conditions such as Parkinson's disease, high blood pressure (hypertension), diabetes, and thyroid problems.  Exposure to extreme heat. DIAGNOSIS  Your health care provider will ask about your symptoms, perform a physical exam, and perform an electrocardiogram (ECG) to record the electrical activity of your heart. Your health care provider may also perform other heart or blood tests to determine the cause of your dizziness. These may include:  Transthoracic echocardiogram (TTE). During echocardiography, sound waves are used to evaluate how blood flows through your  heart.  Transesophageal echocardiogram (TEE).  Cardiac monitoring. This allows your health care provider to monitor your heart rate and rhythm in real time.  Holter monitor. This is a portable device that records your heartbeat and can help diagnose heart arrhythmias. It allows your health care provider to track your heart activity for several days if needed.  Stress tests by exercise or by giving medicine that makes the heart beat faster. TREATMENT  Treatment of dizziness depends on the cause of your symptoms and can vary greatly. HOME CARE INSTRUCTIONS   Drink enough fluids to keep your urine clear or pale yellow. This is especially important in very hot weather. In older adults, it is also important in cold weather.  Take your medicine exactly as directed if your dizziness is caused by medicines. When taking blood pressure medicines, it is especially important to get up slowly.  Rise slowly from chairs and steady yourself until you feel okay.  In the morning, first sit up on the side of the bed. When you feel okay, stand slowly while holding onto something until you know your balance is fine.  Move your legs often if you need to stand in one place for a long time. Tighten and relax your muscles in your legs while standing.  Have someone stay with you for 1-2 days if dizziness continues to be a problem. Do this until you feel you are well enough to stay alone. Have the person call your health care provider if he or she notices changes in you that are concerning.  Do not drive or use heavy machinery if you feel dizzy.  Do not drink alcohol. SEEK IMMEDIATE MEDICAL CARE IF:   Your dizziness or light-headedness gets worse.  You feel nauseous or vomit.  You have problems talking, walking, or using your arms, hands, or legs.  You feel weak.  You are not thinking clearly or you have trouble forming sentences. It may take a friend or family member to notice this.  You have chest  pain, abdominal pain, shortness of breath, or sweating.  Your vision changes.  You notice any bleeding.  You have side effects from medicine that seems to be getting worse rather than better. MAKE SURE YOU:   Understand these instructions.  Will watch your condition.  Will get help right away if you are not doing well or get worse. Document Released: 02/18/2001 Document Revised: 08/30/2013 Document Reviewed: 03/14/2011 [facility] Patient Information 2015 [place], [place]. This information is not intended to replace advice given to you by your health care provider. Make sure you discuss any questions you have with your health care provider.     Hyperventilation Hyperventilation is breathing that is deeper and more rapid than normal. It is usually associated with panic and anxiety. Hyperventilation can make you feel breathless. It is sometimes called overbreathing. Breathing out too much causes a decrease in the amount of carbon dioxide gas in the blood. This leads to tingling and numbness in the hands, feet, and around the mouth. If this continues, your fingers, hands, and toes may begin to spasm. Hyperventilation usually lasts 20-30 minutes and can be associated with other symptoms of panic and anxiety, including:   Chest pains or tightness.  A pounding or irregular, racing heartbeat (palpitations).  Dizziness.  Lightheadedness.  Dry mouth.  Weakness.  Confusion.  Sleep disturbance. CAUSES  Sudden onset (acute) hyperventilation is usually triggered by acute stress, anxiety, or emotional upset. Long-term (chronic) and recurring hyperventilation can occur with chronic lung problems, such emphysema or asthma. Other causes include:   Nervousness.  Stress.  Stimulant, drug, or alcohol use.  Lung disease.  Infections, such as pneumonia.  Heart problems.  Severe pain.  Waking from a bad dream.  Pregnancy.  Bleeding. HOME CARE INSTRUCTIONS  Learn and use breathing  exercises that help you breathe from your diaphragm and abdomen.  Practice relaxation techniques to reduce stress, such as visualization, meditation, and muscle release.  During an attack, try breathing into a paper bag. This changes the carbon dioxide level and slows down breathing. SEEK IMMEDIATE MEDICAL CARE IF:  Your hyperventilation continues or gets worse. MAKE SURE YOU:  Understand these instructions.  Will watch your condition.  Will get help right away if you are not doing well or get worse. Document Released: 08/22/2000 Document Revised: 02/24/2012 Document Reviewed: 12/04/2011 [facility] Patient Information 2015 [place], [place]. This information is not intended to replace advice given to you by your health care provider. Make sure you discuss any questions you have with your health care provider.

## 2015-03-07 NOTE — ED Notes (Signed)
Pt placed in gown and in bed. Pt monitored by pulse ox, bp cuff, and 12-lead. 

## 2015-03-07 NOTE — ED Notes (Signed)
Pt states she has a witness 15-20 second LOC at work and was eased to the ground with no injury.  Become N/V immediately after coming too with 1 episode of vomittng .Pt states she has been having dizzy episodes for over 2 years and has gradually gotten worse over the last 2 months.  Pt was seen 2 weeks ago for same.  Never followed up.  States hx vertigo.  EMS: 124/74, 66, 100%, CBG 91, 20 L AC.  No c/o pain

## 2015-03-13 ENCOUNTER — Telehealth: Payer: Self-pay | Admitting: Internal Medicine

## 2015-03-13 NOTE — Telephone Encounter (Signed)
Call to patient to confirm appointment for 03/14/15 at 2:15 lmtcb ° °

## 2015-03-14 ENCOUNTER — Ambulatory Visit (INDEPENDENT_AMBULATORY_CARE_PROVIDER_SITE_OTHER): Payer: Self-pay | Admitting: Internal Medicine

## 2015-03-14 ENCOUNTER — Encounter: Payer: Self-pay | Admitting: Internal Medicine

## 2015-03-14 VITALS — BP 138/80 | HR 102 | Temp 98.1°F | Ht 63.0 in | Wt 212.2 lb

## 2015-03-14 DIAGNOSIS — R000 Tachycardia, unspecified: Secondary | ICD-10-CM

## 2015-03-14 DIAGNOSIS — H538 Other visual disturbances: Secondary | ICD-10-CM

## 2015-03-14 DIAGNOSIS — E039 Hypothyroidism, unspecified: Secondary | ICD-10-CM

## 2015-03-14 DIAGNOSIS — H8102 Meniere's disease, left ear: Secondary | ICD-10-CM

## 2015-03-14 DIAGNOSIS — I4719 Other supraventricular tachycardia: Secondary | ICD-10-CM

## 2015-03-14 DIAGNOSIS — F411 Generalized anxiety disorder: Secondary | ICD-10-CM

## 2015-03-14 DIAGNOSIS — I471 Supraventricular tachycardia: Secondary | ICD-10-CM

## 2015-03-14 DIAGNOSIS — I10 Essential (primary) hypertension: Secondary | ICD-10-CM

## 2015-03-14 NOTE — Patient Instructions (Signed)
STOP TAKING YOUR ATENOLOL.  RETURN IN 2 WEEKS.  WE HAVE PLACED REFERRAL TO OB/GYN, EYE DOCTOR AND CARDIOLOGY.  Syncope Syncope is a medical term for fainting or passing out. This means you lose consciousness and drop to the ground. People are generally unconscious for less than 5 minutes. You may have some muscle twitches for up to 15 seconds before waking up and returning to normal. Syncope occurs more often in older adults, but it can happen to anyone. While most causes of syncope are not dangerous, syncope can be a sign of a serious medical problem. It is important to seek medical care.  CAUSES  Syncope is caused by a sudden drop in blood flow to the brain. The specific cause is often not determined. Factors that can bring on syncope include:  Taking medicines that lower blood pressure.  Sudden changes in posture, such as standing up quickly.  Taking more medicine than prescribed.  Standing in one place for too long.  Seizure disorders.  Dehydration and excessive exposure to heat.  Low blood sugar (hypoglycemia).  Straining to have a bowel movement.  Heart disease, irregular heartbeat, or other circulatory problems.  Fear, emotional distress, seeing blood, or severe pain. SYMPTOMS  Right before fainting, you may:  Feel dizzy or light-headed.  Feel nauseous.  See all white or all black in your field of vision.  Have cold, clammy skin. DIAGNOSIS  Your health care provider will ask about your symptoms, perform a physical exam, and perform an electrocardiogram (ECG) to record the electrical activity of your heart. Your health care provider may also perform other heart or blood tests to determine the cause of your syncope which may include:  Transthoracic echocardiogram (TTE). During echocardiography, sound waves are used to evaluate how blood flows through your heart.  Transesophageal echocardiogram (TEE).  Cardiac monitoring. This allows your health care provider to  monitor your heart rate and rhythm in real time.  Holter monitor. This is a portable device that records your heartbeat and can help diagnose heart arrhythmias. It allows your health care provider to track your heart activity for several days, if needed.  Stress tests by exercise or by giving medicine that makes the heart beat faster. TREATMENT  In most cases, no treatment is needed. Depending on the cause of your syncope, your health care provider may recommend changing or stopping some of your medicines. HOME CARE INSTRUCTIONS  Have someone stay with you until you feel stable.  Do not drive, use machinery, or play sports until your health care provider says it is okay.  Keep all follow-up appointments as directed by your health care provider.  Lie down right away if you start feeling like you might faint. Breathe deeply and steadily. Wait until all the symptoms have passed.  Drink enough fluids to keep your urine clear or pale yellow.  If you are taking blood pressure or heart medicine, get up slowly and take several minutes to sit and then stand. This can reduce dizziness. SEEK IMMEDIATE MEDICAL CARE IF:   You have a severe headache.  You have unusual pain in the chest, abdomen, or back.  You are bleeding from your mouth or rectum, or you have black or tarry stool.  You have an irregular or very fast heartbeat.  You have pain with breathing.  You have repeated fainting or seizure-like jerking during an episode.  You faint when sitting or lying down.  You have confusion.  You have trouble walking.  You have  severe weakness.  You have vision problems. If you fainted, call your local emergency services (911 in [place] Do not drive yourself to the hospital.  MAKE SURE YOU:  Understand these instructions.  Will watch your condition.  Will get help right away if you are not doing well or get worse. Document Released: 08/25/2005 Document Revised: 08/30/2013 Document  Reviewed: 10/24/2011 [facility] Patient Information 2015 [place], [place]. This information is not intended to replace advice given to you by your health care provider. Make sure you discuss any questions you have with your health care provider.

## 2015-03-14 NOTE — Progress Notes (Signed)
Medicine attending: Medical history, presenting problems, physical findings, and medications, reviewed with Dr Osa Craver and I concur with her evaluation and management plan. We are going to check orthostatic pulses and pressures.  Temporary discontinuation of beta blocker to see if this is causing her symptoms. Short interval follow up.

## 2015-03-15 ENCOUNTER — Telehealth: Payer: Self-pay | Admitting: Internal Medicine

## 2015-03-15 ENCOUNTER — Telehealth: Payer: Self-pay | Admitting: *Deleted

## 2015-03-15 DIAGNOSIS — H538 Other visual disturbances: Secondary | ICD-10-CM | POA: Insufficient documentation

## 2015-03-15 LAB — TSH: TSH: 0.015 u[IU]/mL — ABNORMAL LOW (ref 0.350–4.500)

## 2015-03-15 MED ORDER — LEVOTHYROXINE SODIUM 25 MCG PO TABS
25.0000 ug | ORAL_TABLET | Freq: Every day | ORAL | Status: DC
Start: 2015-03-15 — End: 2016-03-10

## 2015-03-15 MED ORDER — ATENOLOL 25 MG PO TABS: 25.0000 mg | ORAL_TABLET | Freq: Every day | ORAL | Status: DC

## 2015-03-15 NOTE — Assessment & Plan Note (Signed)
Patient was on Atenolol 50 mg daily. She believes atenolol slows her heart rate down too much. It controls her palpitations, but she believes it has been causing her recent syncopal episodes. This may be true. Patient does not want to stop Atenolol completely.   Plan: -Reduce dose to Atenolol 25 mg daily -Return in 2-4 weeks -Referral to Cardiology for possible holter monitor

## 2015-03-15 NOTE — Progress Notes (Signed)
   Subjective:    [name], female    DOB: 10/20/1985, 29 y.o.   MRN: [id number]  HPI [name] is a 29 yo female with PMHx of HTN, Hypothyroidism, Atrial Ectopic Tachycardia, Meneire's disease, and anxiety who presents for follow up for syncope.  Review of Systems General: Denies fever, chills, fatigue, and diaphoresis.  Respiratory: Admits to SOB. Denies cough, DOE, chest tightness, and wheezing.   Cardiovascular: Admits to palpitations. Denies chest pain Gastrointestinal: Denies nausea, vomiting, abdominal pain, diarrhea Musculoskeletal: Denies myalgias, arthralgias  Skin: Denies pallor, rash and wounds.  Neurological: Admits to blurry vision, dizziness, weakness, lightheadedness, and syncope. Psychiatric/Behavioral: Admits to anxiety. Denies sleep disturbance  Past Medical History  Diagnosis Date  . Thyrotoxicosis   . Atrial tachycardia   . Dizziness   . Hypothyroid   . Depression   . Anxiety   . Meniere's disease   . Anemia    Outpatient Encounter Prescriptions as of 03/14/2015  Medication Sig  . acetaminophen (TYLENOL) 500 MG tablet Take 1,000 mg by mouth every 6 (six) hours as needed for mild pain or moderate pain.  Marland Kitchen levothyroxine (SYNTHROID, LEVOTHROID) 50 MCG tablet Take 1 tablet (50 mcg total) by mouth daily before breakfast.  . meclizine (ANTIVERT) 50 MG tablet Take 1 tablet (50 mg total) by mouth 2 (two) times daily as needed for dizziness. (Patient not taking: Reported on 03/07/2015)  . [DISCONTINUED] atenolol (TENORMIN) 25 MG tablet TAKE 1 TABLET BY MOUTH EVERY DAY   No facility-administered encounter medications on file as of 03/14/2015.       Objective:   Physical Exam Filed Vitals:   03/14/15 1418 03/14/15 1419  BP:  138/80  Pulse:  102  Temp:  98.1 F (36.7 C)  TempSrc:  Oral  Height: 5\' 3"  (1.6 m)   Weight: 212 lb 3.2 oz (96.253 kg)   SpO2:  100%   General: Vital signs reviewed.  Patient is well-developed and well-nourished, in no  acute distress and cooperative with exam.  Cardiovascular: Tachycardic, regular rhythm, S1 normal, S2 normal, no murmurs, gallops, or rubs. Pulmonary/Chest: Clear to auscultation bilaterally, no wheezes, rales, or rhonchi. Abdominal: Soft, non-tender, non-distended, BS + Extremities: No lower extremity edema bilaterally, pulses symmetric and intact bilaterally.  Neurological: A&O x3, Strength is normal and symmetric bilaterally, cranial nerve II-XII are grossly intact, no focal motor deficit, sensory intact to light touch bilaterally.  Skin: Warm, dry and intact. No rashes or erythema. Psychiatric: Normal mood and affect. Speech and behavior is normal. Cognition and memory are normal.     Assessment & Plan:   Please see problem oriented assessment and plan.

## 2015-03-15 NOTE — Assessment & Plan Note (Signed)
Patient states anxiety has improved since she has moved out of her mother's house. She continues to have episodes of anxiety (particularly when thinking about death), but does not want to take zoloft as she would like to try to control her symptoms on her own.  Plan: -Monitor

## 2015-03-15 NOTE — Telephone Encounter (Signed)
Called patient to inform her of her low TSH and need to drop Synthroid dose to 25 mcg per day. I told patient to please call the clinic back to confirm she got this message and to ask any questions.

## 2015-03-15 NOTE — Assessment & Plan Note (Signed)
Patient complains of chronic blurry vision which makes it hard for her to see far distances and to drive at nighttime. Patient states she previously wore glasses, but hasn't in a long time. I am not sure why she stopped wearing them. She states she still has one pair. Patient has not seen an eye doctor in some time.  Plan: -Referral to ophthalmologist

## 2015-03-15 NOTE — Assessment & Plan Note (Signed)
Patient continues to have weekly episodes of vertiginous symptoms that last for about 5 minutes and resolve on their own. They occur after looking at one spot for an extended period of time or turning her head too quickly. Patient has tried meclizine without relief. It is difficult to tell if this episodes are contributing or causing her recent syncopal episodes. i feel they may be unrelated as she has had Meniere's for a long period of time whereas the syncopal episodes are new. She would be a good candidate for vestibular rehab, but as we have many referrals in place right now, we will continue to monitor.  Plan: -Monitor -Meclizine prn -Future referral to vestibular rehab

## 2015-03-15 NOTE — Telephone Encounter (Signed)
Pt rtc to dr Marvel Plan, informed pt from the note dr Marvel Plan has in [facility], pt repeated the new med dose and confirmed pharm. She is ask to call for any problems and is agreeable

## 2015-03-15 NOTE — Assessment & Plan Note (Signed)
Patient is currently on Synthroid 50 mcg daily. Recheck of TSH shows that her TSH continues to be too low (0.015). This could be contributing to her tachycardia, shortness of breath and syncopal episodes.   Plan: -Decrease Synthroid to 25 mcg daily -Recheck TSH in 6 weeks -Continue Atenolol 25 mg daily

## 2015-03-15 NOTE — Assessment & Plan Note (Addendum)
BP Readings from Last 3 Encounters:  03/14/15 138/80  03/07/15 115/75  02/27/15 112/64    Lab Results  Component Value Date   NA 139 03/07/2015   K 4.2 03/07/2015   CREATININE 0.80 03/07/2015    Assessment: Blood pressure control:  Controlled Progress toward BP goal:   At goal Comments: Patient has not taken her Atenolol yet today. Previously on Atenolol 50 mg daily.  Plan: Medications:  Due to recent syncope (vasovagal versus orthostatic) will decreased dose to 25 mg daily. Educational resources provided: brochure, handout, video Other plans: Return in 2-4 weeks for re-evaluation.

## 2015-03-19 ENCOUNTER — Telehealth: Payer: Self-pay | Admitting: Internal Medicine

## 2015-03-19 DIAGNOSIS — I471 Supraventricular tachycardia: Secondary | ICD-10-CM

## 2015-03-19 DIAGNOSIS — I10 Essential (primary) hypertension: Secondary | ICD-10-CM

## 2015-03-19 NOTE — Telephone Encounter (Signed)
Patient has questions about a medication she wanting to know if the dosage can be back to what it used to be because the lower dosage isnt working

## 2015-03-19 NOTE — Telephone Encounter (Signed)
Thank you Alyssa Hall 

## 2015-03-20 NOTE — Telephone Encounter (Signed)
Pt calls and needs to discuss BP med with you please call her, thanks

## 2015-03-21 MED ORDER — ATENOLOL 50 MG PO TABS: 50.0000 mg | ORAL_TABLET | Freq: Every day | ORAL | Status: DC

## 2015-03-21 NOTE — Telephone Encounter (Signed)
Returned call to patient. Patient is requesting to go back on her Atenolol 50 mg daily. This was recently decreased to 25 mg daily because she didn't think it was helping and she had also had 2 recent syncopal episodes. However, patient is having increased palpitations and would like to go back to 50 mg daily. She denies any chest pain, shortness of breath or dizziness. I think this is a reasonable plan. Patient has been compliant with her reduced dose of synthroid 25 mcg daily as well. Patient has an appointment with Cardiology on 04/07/15 for her atrial ectopic tachycardia and further evaluation of recent syncope.

## 2015-03-28 ENCOUNTER — Encounter: Payer: Self-pay | Admitting: Internal Medicine

## 2015-03-28 ENCOUNTER — Ambulatory Visit (INDEPENDENT_AMBULATORY_CARE_PROVIDER_SITE_OTHER): Payer: No Typology Code available for payment source | Admitting: Internal Medicine

## 2015-03-28 VITALS — BP 120/64 | HR 88 | Temp 98.2°F | Ht 63.0 in | Wt 214.6 lb

## 2015-03-28 DIAGNOSIS — M545 Low back pain: Secondary | ICD-10-CM

## 2015-03-28 DIAGNOSIS — H8102 Meniere's disease, left ear: Secondary | ICD-10-CM

## 2015-03-28 DIAGNOSIS — M791 Myalgia: Secondary | ICD-10-CM

## 2015-03-28 DIAGNOSIS — T148XXA Other injury of unspecified body region, initial encounter: Secondary | ICD-10-CM | POA: Insufficient documentation

## 2015-03-28 DIAGNOSIS — I10 Essential (primary) hypertension: Secondary | ICD-10-CM

## 2015-03-28 DIAGNOSIS — I471 Supraventricular tachycardia: Secondary | ICD-10-CM

## 2015-03-28 MED ORDER — NAPROXEN 500 MG PO TABS: 500.0000 mg | ORAL_TABLET | Freq: Two times a day (BID) | ORAL | Status: DC

## 2015-03-28 NOTE — Assessment & Plan Note (Signed)
Patient continues to have dizziness and vertiginous symptoms which occur when patient moves her head quickly to own side. This is particular bothersome when she is driving. Meclizine does not help.  Plan: -Referral to Vestibular Rehab

## 2015-03-28 NOTE — Progress Notes (Signed)
   Subjective:    [name], female    DOB: 1985/12/17, 29 y.o.   MRN: [id number]  HPI [name] is a 29 yo female with PMHx of HTN, hypothyroidism, and atrial ectopic tachycardia who presents for follow up for her atrial ectopic tachycardia. Please see problem oriented assessment and plan for more information.  Review of Systems General: Admits to fatigue. Denies fever, chills, change in appetite and diaphoresis.  Respiratory: Denies SOB, cough, DOE   Cardiovascular: Denies chest pain and palpitations.  Gastrointestinal: Denies nausea, vomiting, abdominal pain, diarrhea Genitourinary: Denies dysuria, urgency, frequency, hematuria, suprapubic pain and flank pain. Musculoskeletal: Admits to myalgias and back pain. Denies joint swelling, arthralgias and gait problem. .  Neurological: Admits to vertigo and blurry vision. Denies headaches, weakness, lightheadedness, numbness, and syncope  Past Medical History  Diagnosis Date  . Thyrotoxicosis   . Atrial tachycardia   . Dizziness   . Hypothyroid   . Depression   . Anxiety   . Meniere's disease   . Anemia   ' Outpatient Encounter Prescriptions as of 03/28/2015  Medication Sig  . acetaminophen (TYLENOL) 500 MG tablet Take 1,000 mg by mouth every 6 (six) hours as needed for mild pain or moderate pain.  Marland Kitchen atenolol (TENORMIN) 50 MG tablet Take 1 tablet (50 mg total) by mouth daily.  Marland Kitchen levothyroxine (SYNTHROID, LEVOTHROID) 25 MCG tablet Take 1 tablet (25 mcg total) by mouth daily before breakfast.  . meclizine (ANTIVERT) 50 MG tablet Take 1 tablet (50 mg total) by mouth 2 (two) times daily as needed for dizziness. (Patient not taking: Reported on 03/07/2015)  . naproxen (NAPROSYN) 500 MG tablet Take 1 tablet (500 mg total) by mouth 2 (two) times daily with a meal.   No facility-administered encounter medications on file as of 03/28/2015.       Objective:   Physical Exam Filed Vitals:   03/28/15 1423  BP: 120/64  Pulse:  88  Temp: 98.2 F (36.8 C)  TempSrc: Oral  Height: 5\' 3"  (1.6 m)  Weight: 214 lb 9.6 oz (97.342 kg)  SpO2: 100%   General: Vital signs reviewed.  Patient is well-developed and well-nourished, in no acute distress and cooperative with exam.  Cardiovascular: RRR, S1 normal, S2 normal, no murmurs, gallops, or rubs. Pulmonary/Chest: Clear to auscultation bilaterally, no wheezes, rales, or rhonchi. Abdominal: Soft, non-tender, non-distended, BS + Musculoskeletal: Tender on palpation of latissimus dorsi and erector spinae muscles bilaterally in lower back. No erythema, or stiffness, ROM full and nontender. Extremities: No lower extremity edema bilaterally, pulses symmetric and intact bilaterally.  Neurological: Strength is normal and symmetric bilaterally, sensory intact to light touch bilaterally.  Skin: Warm, dry and intact. No rashes or erythema.    Assessment & Plan:   Please see problem oriented assessment and plan.

## 2015-03-28 NOTE — Assessment & Plan Note (Signed)
Patient self-titrated back to atenolol 50 mg daily as her palpitations had returned. She has been tolerating this does well without recurrent pre-syncopal symptoms. She denies palpitations, shortness of breath or chest pain. RRR on examination; HR 88.  Plan: -Continue atenolol 50 mg QD

## 2015-03-28 NOTE — Assessment & Plan Note (Signed)
Patient complains of low back muscular pain that is dull, achey, worse with movement, constant for 2 weeks. 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 She believed she strained it 2 weeks ago during one of these sessions. She has tried tylenol without relief. Patient denies any urinary symptoms. Physical exam is remarkable for tenderness on palpation of erector spinae and latissimus doris muscles in mid-lower back bilaterally. No decreased strength or loss of sensation in lower extremities. No loss of urinary or bowel continence.   Plan: -Naproxen 500 mg BID x 7 days -Ice/heat -Proper lifting technique

## 2015-03-28 NOTE — Patient Instructions (Addendum)
General Instructions:  Please bring your medicines with you each time you come to clinic.  Medicines may include prescription medications, over-the-counter medications, herbal remedies, eye drops, vitamins, or other pills.   FOR YOUR BACK PAIN: -TAKE NAPROXEN (ALEVE) 500 MG TWICE A DAY WITH A MEAL -ICE/HEAT YOUR BACK MUSCLES, ESPECIALLY AFTER WORK  FOR YOUR VERTIGO: -FOLLOW UP WITH VESTIBULAR REHAB (WE WILL SET THIS UP FOR YOU)  KEEP TAKING: -SYNTHROID 25 MCG DAILY -ATENOLOL 50 MG DAILY  Muscle Strain A muscle strain is an injury that occurs when a muscle is stretched beyond its normal length. Usually a small number of muscle fibers are torn when this happens. Muscle strain is rated in degrees. First-degree strains have the least amount of muscle fiber tearing and pain. Second-degree and third-degree strains have increasingly more tearing and pain.  Usually, recovery from muscle strain takes 1-2 weeks. Complete healing takes 5-6 weeks.  CAUSES  Muscle strain happens when a sudden, violent force placed on a muscle stretches it too far. This may occur with lifting, sports, or a fall.  RISK FACTORS Muscle strain is especially common in athletes.  SIGNS AND SYMPTOMS At the site of the muscle strain, there may be:  Pain.  Bruising.  Swelling.  Difficulty using the muscle due to pain or lack of normal function. DIAGNOSIS  Your health care provider will perform a physical exam and ask about your medical history. TREATMENT  Often, the best treatment for a muscle strain is resting, icing, and applying cold compresses to the injured area.  HOME CARE INSTRUCTIONS   Use the PRICE method of treatment to promote muscle healing during the first 2-3 days after your injury. The PRICE method involves:  Protecting the muscle from being injured again.  Restricting your activity and resting the injured body part.  Icing your injury. To do this, put ice in a plastic bag. Place a towel between  your skin and the bag. Then, apply the ice and leave it on from 15-20 minutes each hour. After the third day, switch to moist heat packs.  Apply compression to the injured area with a splint or elastic bandage. Be careful not to wrap it too tightly. This may interfere with blood circulation or increase swelling.  Elevate the injured body part above the level of your heart as often as you can.  Only take over-the-counter or prescription medicines for pain, discomfort, or fever as directed by your health care provider.  Warming up prior to exercise helps to prevent future muscle strains. SEEK MEDICAL CARE IF:   You have increasing pain or swelling in the injured area.  You have numbness, tingling, or a significant loss of strength in the injured area. MAKE SURE YOU:   Understand these instructions.  Will watch your condition.  Will get help right away if you are not doing well or get worse. Document Released: 08/25/2005 Document Revised: 06/15/2013 Document Reviewed: 03/24/2013 [facility] Patient Information 2015 [place], [place]. This information is not intended to replace advice given to you by your health care provider. Make sure you discuss any questions you have with your health care provider.

## 2015-03-28 NOTE — Assessment & Plan Note (Signed)
BP Readings from Last 3 Encounters:  03/28/15 120/64  03/14/15 138/80  03/07/15 115/75    Lab Results  Component Value Date   NA 139 03/07/2015   K 4.2 03/07/2015   CREATININE 0.80 03/07/2015    Assessment: Blood pressure control:   Well controlled Progress toward BP goal:   At goal Comments: On atenolol 50 mg QD  Plan: Medications:  continue current medications

## 2015-03-29 ENCOUNTER — Ambulatory Visit (INDEPENDENT_AMBULATORY_CARE_PROVIDER_SITE_OTHER): Payer: Self-pay | Admitting: Family Medicine

## 2015-03-29 ENCOUNTER — Encounter: Payer: Self-pay | Admitting: Family Medicine

## 2015-03-29 ENCOUNTER — Other Ambulatory Visit: Payer: Self-pay | Admitting: Internal Medicine

## 2015-03-29 VITALS — BP 121/66 | HR 71 | Temp 98.2°F | Ht 63.0 in | Wt 211.0 lb

## 2015-03-29 DIAGNOSIS — H8102 Meniere's disease, left ear: Secondary | ICD-10-CM

## 2015-03-29 DIAGNOSIS — N946 Dysmenorrhea, unspecified: Secondary | ICD-10-CM

## 2015-03-29 DIAGNOSIS — N920 Excessive and frequent menstruation with regular cycle: Secondary | ICD-10-CM

## 2015-03-29 NOTE — Progress Notes (Signed)
   Subjective:    [name], female    DOB: 12-Jun-1986, 29 y.o.   MRN: [id number]  HPI Patient referred to our clinic from her PCP due to dysmenorrhea and menorrhagia. Her symptoms started 2 years ago. The patient's having regular cycles approximately every 28-30 days. She has heavy bleeding that lasts approximately 6 days. Her heavy days are days 3,4, and 5. 2 years ago, she had an ultrasound that showed multiple small fibroids on her uterus with an enlarged uterus. Cramping is so severe that he can cause radiation into her legs. No palliating or provoking factors.   Review of Systems  Constitutional: Negative for fever, chills and fatigue.  Gastrointestinal: Negative for nausea, vomiting, abdominal pain, diarrhea and constipation.  Genitourinary: Negative for urgency, decreased urine volume, vaginal bleeding, vaginal discharge and vaginal pain.   I have reviewed the patients past medical, family, and social history.  I have reviewed the patient's medication list and allergies.     Objective:   Physical Exam  Constitutional: She appears well-developed and well-nourished.  Abdominal: Soft. Bowel sounds are normal. She exhibits no distension and no mass. There is no tenderness. There is no rebound and no guarding. Hernia confirmed negative in the right inguinal area and confirmed negative in the left inguinal area.  Genitourinary: There is no rash, tenderness, lesion or injury on the right labia. There is no rash, tenderness, lesion or injury on the left labia. Uterus is enlarged (13 week size). Uterus is not deviated and not tender. Cervix exhibits no motion tenderness, no discharge and no friability. Right adnexum displays no mass, no tenderness and no fullness. Left adnexum displays no mass, no tenderness and no fullness. No erythema, tenderness or bleeding in the vagina. No foreign body around the vagina. No signs of injury around the vagina. No vaginal discharge found.    Lymphadenopathy:       Right: No inguinal adenopathy present.       Left: No inguinal adenopathy present.  Skin: Skin is warm and dry.  Psychiatric: She has a normal mood and affect. Her behavior is normal. Judgment and thought content normal.      Assessment & Plan:  1. Menorrhagia with regular cycle Likely due to fibroids. Discussed treatment options of oral contraception pills versus IUD. Recheck ultrasound. Patient wants to think about options. Will call patient with results and discuss options.  - US Pelvis Complete; Future - US Transvaginal Non-OB; Future  2. Dysmenorrhea As above

## 2015-03-29 NOTE — Progress Notes (Signed)
Here today because has been having heavy periods with clots since she found out she has fibroids 2 years ago.

## 2015-03-29 NOTE — Progress Notes (Signed)
Medicine attending: Medical history, presenting problems, physical findings, and medications, reviewed with Dr Osa Craver on the day of the patient visit and I concur with her evaluation and management plan.

## 2015-04-02 ENCOUNTER — Encounter: Payer: Self-pay | Admitting: Physical Therapy

## 2015-04-02 ENCOUNTER — Ambulatory Visit: Payer: No Typology Code available for payment source | Attending: Internal Medicine | Admitting: Physical Therapy

## 2015-04-02 DIAGNOSIS — R2689 Other abnormalities of gait and mobility: Secondary | ICD-10-CM

## 2015-04-02 DIAGNOSIS — M545 Low back pain, unspecified: Secondary | ICD-10-CM

## 2015-04-02 DIAGNOSIS — R42 Dizziness and giddiness: Secondary | ICD-10-CM

## 2015-04-02 NOTE — Therapy (Signed)
[facility] [address] [place], [place], [address] Phone: [phone]   Fax:  [phone]  Physical Therapy Evaluation  Patient Details  Name: Alyssa Hall MRN: [id number] Date of Birth: [birth year] Referring Provider:  Vickii Chafe, MD  Encounter Date: 04/02/2015      PT End of Session - 04/02/15 1642    Visit Number 1   Number of Visits 9   Date for PT Re-Evaluation 05/03/15   Authorization Type [employer] discount   PT Start Time 1020   PT Stop Time 1100   PT Time Calculation (min) 40 min      Past Medical History  Diagnosis Date  . Thyrotoxicosis   . Atrial tachycardia   . Dizziness   . Hypothyroid   . Depression   . Anxiety   . Meniere's disease   . Anemia   . Fibroids     History reviewed. No pertinent past surgical history.  There were no vitals filed for this visit.  Visit Diagnosis:  Dizziness and giddiness  Bilateral low back pain without sciatica  Abnormality of gait due to impairment of balance      Subjective Assessment - 04/02/15 1624    Subjective Pt. presents with c/o dizziness which she states started approx. 2 years ago when diagnosed with Meneire's disease; pt states she saw ENT, Dr. Ernesto Rutherford, in the past but he no longer takes her insurance so she is unable to follow up with him. Pt reports constant dizziness which is worse with head turns; reports tinnitus in L ear accompanied by fullness and also reports hearing loss in left ear and L eye twitching.  Pt. also reports dizziness with sit to stand transfers; reports having had 2 falls in June due to dizziness and fainting.  Pt went to [facility] ED on 6-21 and 03-07-15 with c/o vertigo.                                                                                                   Pertinent History atrial tachycardia, anxiety, Meniere's disease   Patient Stated Goals Reduce vertigo and decrease back pain; improve balance   Currently  in Pain? Yes   Pain Score 10-Worst pain ever   Pain Location Back   Pain Orientation Upper;Mid;Lower   Pain Descriptors / Indicators Constant;Burning;Throbbing   Pain Type Chronic pain   Pain Onset More than a month ago   Pain Frequency Constant   Multiple Pain Sites No            [facility] PT Assessment - 04/02/15 1046    Assessment   Medical Diagnosis Meniere's disease;    Onset Date/Surgical Date 03/07/15  diagnosed 2 yrs ago with Meniere's disease   Balance Screen   Has the patient fallen in the past 6 months Yes   How many times? 2   Has the patient had a decrease in activity level because of a fear of falling?  Yes   Is the patient reluctant to leave their home because of a fear of falling?  No   Ambulation/Gait  Ambulation/Gait Yes   Ambulation/Gait Assistance 6: Modified independent (Device/Increase time)   Ambulation Distance (Feet) 100 Feet   Assistive device --  none   Gait Pattern Within Functional Limits   Ambulation Surface Level;Indoor   Balance   Balance Assessed Yes            Vestibular Assessment - 04/02/15 0001    Vestibular Assessment   General Observation Pt. is a 29 year old female with c/o constant vertigo due to Meniere's disease; states she has more anxiety when she is alone   Symptom Behavior   Type of Dizziness "World moves"   Frequency of Dizziness constant   Duration of Dizziness varies - is constant but increases with certain movements   Aggravating Factors Turning head quickly;Sit to stand   Occulomotor Exam   Occulomotor Alignment Normal   Visual Acuity   Static line 7   Dynamic line 5   pt. reported significant incr. in vertigo after testing                       PT Education - 04/02/15 1640    Education provided Yes   Education Details gaze stabilization; Meniere's disease information   Person(s) Educated Patient   Methods Explanation;Demonstration;Handout   Comprehension Verbalized understanding;Returned  demonstration             PT Long Term Goals - 04/02/15 1649    PT LONG TERM GOAL #1   Title Dizziness Handicap Index test score will improve by at least 25% for improved mobility.  (05-03-15)   Time 4   Period Weeks   Status New   PT LONG TERM GOAL #2   Title Report decr. back pain to </= 6/10 for imrpoved mobility.   (05-03-15)   Time 4   Period Weeks   Status New   PT LONG TERM GOAL #3   Title Verbalize understanding of dietary recommendations to assist with Meniere's disease management. (05-03-15)   Time 4   Period Weeks   Status New   PT LONG TERM GOAL #4   Title Amb. 100' with vertigo </= 4/10 intensity while performing head turns at least 50' of this distance.  (05-03-15)   Time 4   Period Weeks   Status New   PT LONG TERM GOAL #5   Title Independent in HEP for vestibular exercises.   (05-03-15)   Time 4   Period Weeks   Status New               Plan - 04/02/15 1643    Clinical Impression Statement Pt. presents with c/o vertigo due to Meniere's disease; also c/o back pain - primarily tightness resulting in decr. flexibility; pt also has decr. visual acuity per  [facility]VA testing - recommended that pt follow up with opthalmologist as referred by primary MD   Pt will benefit from skilled therapeutic intervention in order to improve on the following deficits Difficulty walking;Decreased mobility;Impaired flexibility;Decreased balance;Dizziness   Rehab Potential Good   PT Frequency 2x / week   PT Duration 4 weeks   PT Treatment/Interventions ADLs/Self Care Home Management;Functional mobility training;Gait training;Therapeutic activities;Therapeutic exercise;Balance training;Neuromuscular re-education;Patient/family education;Vestibular   PT Next Visit Plan review gaze stabilization exercise; add balance on foam to HEP; back exercises for stretching   PT Home Exercise Plan see above   Consulted and Agree with Plan of Care Patient         Problem List Patient  Active Problem List  Diagnosis Date Noted  . Muscle strain 03/28/2015  . Blurry vision, bilateral 03/15/2015  . Ear popping 01/03/2015  . Atrial ectopic tachycardia 12/06/2014  . Exposure to sexually transmitted disease (STD) 04/12/2014  . Skin rash 10/11/2013  . HTN (hypertension) 10/11/2013  . Fibroid uterus 09/22/2013  . Healthcare maintenance 05/27/2013  . 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 Hearing loss 07/19/2012  . Meniere's disease of left ear 07/12/2012  . Dysmenorrhea 05/05/2012  . Iron deficiency anemia 02/04/2012  . Hypothyroidism (acquired) 01/14/2012  . Generalized anxiety disorder 01/14/2012    Alda Lea, PT 04/02/2015, 5:10 PM  [facility] [address] [place], [place], [address] Phone: [phone]   Fax:  [phone]

## 2015-04-02 NOTE — Patient Instructions (Signed)
Gaze Stabilization: Standing Feet Apart   Feet shoulder width apart, keeping eyes on target on wall  __8__ feet away, tilt head down 15-30 and move head side to side for _60___ seconds. Repeat while moving head up and down for _60___ seconds. Do __5__ sessions per day. Repeat using target on pattern background.  Copyright  VHI. All rights reserved.  Gaze Stabilization: Tip Card 1.Target must remain in focus, not blurry, and appear stationary while head is in motion. 2.Perform exercises with small head movements (45 to either side of midline). 3.Increase speed of head motion so long as target is in focus. 4.If you wear eyeglasses, be sure you can see target through lens (therapist will give specific instructions for bifocal / progressive lenses). 5.These exercises may provoke dizziness or nausea. Work through these symptoms. If too dizzy, slow head movement slightly. Rest between each exercise. 6.Exercises demand concentration; avoid distractions. 7.For safety, perform standing exercises close to a counter, wall, corner, or next to someone.  Copyright  VHI. All rights reserved.

## 2015-04-04 ENCOUNTER — Ambulatory Visit: Payer: No Typology Code available for payment source | Admitting: Physical Therapy

## 2015-04-04 ENCOUNTER — Ambulatory Visit (HOSPITAL_COMMUNITY)
Admission: RE | Admit: 2015-04-04 | Discharge: 2015-04-04 | Disposition: A | Discharge status: Home / Self Care | Payer: No Typology Code available for payment source | Source: Ambulatory Visit | Attending: Family Medicine | Admitting: Family Medicine

## 2015-04-04 DIAGNOSIS — N8320 Unspecified ovarian cysts: Secondary | ICD-10-CM | POA: Insufficient documentation

## 2015-04-04 DIAGNOSIS — D252 Subserosal leiomyoma of uterus: Secondary | ICD-10-CM | POA: Insufficient documentation

## 2015-04-04 DIAGNOSIS — N920 Excessive and frequent menstruation with regular cycle: Secondary | ICD-10-CM

## 2015-04-04 DIAGNOSIS — N852 Hypertrophy of uterus: Secondary | ICD-10-CM | POA: Insufficient documentation

## 2015-04-04 DIAGNOSIS — R42 Dizziness and giddiness: Secondary | ICD-10-CM

## 2015-04-04 DIAGNOSIS — M545 Low back pain, unspecified: Secondary | ICD-10-CM

## 2015-04-04 DIAGNOSIS — N946 Dysmenorrhea, unspecified: Secondary | ICD-10-CM | POA: Insufficient documentation

## 2015-04-04 DIAGNOSIS — D251 Intramural leiomyoma of uterus: Secondary | ICD-10-CM | POA: Insufficient documentation

## 2015-04-05 ENCOUNTER — Encounter: Payer: Self-pay | Admitting: Physical Therapy

## 2015-04-05 NOTE — Therapy (Signed)
[facility] [address] [place], [place], [address] Phone: [phone]   Fax:  [phone]  Physical Therapy Treatment  Patient Details  Name: Alyssa Hall MRN: [id number] Date of Birth: October 20, 1985 Referring Provider:  Vickii Chafe, MD  Encounter Date: 04/04/2015      PT End of Session - 04/05/15 2113    Visit Number 2   Number of Visits 9   Date for PT Re-Evaluation 05/03/15   Authorization Type [employer] discount   PT Start Time 0811   PT Stop Time 0905   PT Time Calculation (min) 54 min      Past Medical History  Diagnosis Date  . Thyrotoxicosis   . Atrial tachycardia   . Dizziness   . Hypothyroid   . Depression   . Anxiety   . Meniere's disease   . Anemia   . Fibroids     History reviewed. No pertinent past surgical history.  There were no vitals filed for this visit.  Visit Diagnosis:  Dizziness and giddiness  Bilateral low back pain without sciatica      Subjective Assessment - 04/05/15 2057    Subjective Pt. states not very much vertigo due to appt time being early am; states vertigo increases as day progresses; states she has been doing gaze stabilization exercise at home   Pertinent History atrial tachycardia, anxiety, Meniere's disease   Patient Stated Goals Reduce vertigo and decrease back pain; improve balance   Currently in Pain? Yes   Pain Score 8    Pain Location Back   Pain Orientation Upper;Mid;Lower   Pain Descriptors / Indicators Constant;Burning;Throbbing   Pain Type Chronic pain   Pain Onset More than a month ago   Pain Frequency Constant                              Balance Exercises - 04/05/15 2110    Balance Exercises: Standing   Standing Eyes Opened Narrow base of support (BOS);Wide (BOA);Head turns;Foam/compliant surface   Standing Eyes Closed Wide (BOA);Head turns;2 reps;30 secs;Narrow base of support (BOS);Foam/compliant surface   Rockerboard Anterior/posterior;EO;EC;20 seconds;Head turns   Balance Master: Limits for Stability --  SOT attemptedbut pt unable to tolerate     TherEx:  Pt. Performed stretches for low back as listed in back exercise packet - 8-10 reps each; trunk rotation stretch 15 sec hold with c/o tightness and mild discomfort with this stretch; pt. Also performed bridging, pelvic tilts, single and double Knee to chest x 15 sec hold x 3 reps each;  Bil. Hamstring stretch x 20 sec hold in seated position x 1 rep      PT Education - 04/05/15 2112    Education provided Yes   Education Details balance on foam with EC and EO;  back exercises - pelvic tilts, bridging, trunk rotation   Person(s) Educated Patient   Methods Explanation;Demonstration;Handout   Comprehension Verbalized understanding;Returned demonstration             PT Long Term Goals - 04/02/15 1649    PT LONG TERM GOAL #1   Title Dizziness Handicap Index test score will improve by at least 25% for improved mobility.  (05-03-15)   Time 4   Period Weeks   Status New   PT LONG TERM GOAL #2   Title Report decr. back pain to </= 6/10 for imrpoved mobility.   (05-03-15)   Time 4  Period Weeks   Status New   PT LONG TERM GOAL #3   Title Verbalize understanding of dietary recommendations to assist with Meniere's disease management. (05-03-15)   Time 4   Period Weeks   Status New   PT LONG TERM GOAL #4   Title Amb. 100' with vertigo </= 4/10 intensity while performing head turns at least 50' of this distance.  (05-03-15)   Time 4   Period Weeks   Status New   PT LONG TERM GOAL #5   Title Independent in HEP for vestibular exercises.   (05-03-15)   Time 4   Period Weeks   Status New               Plan - 04/05/15 2114    Clinical Impression Statement Pt. has anxiety with vestibular activities - needs cues to focus on stability and deep breathing for stress management; pt. reported decr. in back pain after performing  stretches; appears to have a muscle strain in low back region   Pt will benefit from skilled therapeutic intervention in order to improve on the following deficits Difficulty walking;Decreased mobility;Impaired flexibility;Decreased balance;Dizziness   Rehab Potential Good   PT Frequency 2x / week   PT Duration 4 weeks   PT Treatment/Interventions ADLs/Self Care Home Management;Functional mobility training;Gait training;Therapeutic activities;Therapeutic exercise;Balance training;Neuromuscular re-education;Patient/family education;Vestibular   PT Next Visit Plan check HEP:  try SOT again   PT Home Exercise Plan see above   Consulted and Agree with Plan of Care Patient        Problem List Patient Active Problem List   Diagnosis Date Noted  . Muscle strain 03/28/2015  . Blurry vision, bilateral 03/15/2015  . Ear popping 01/03/2015  . Atrial ectopic tachycardia 12/06/2014  . Exposure to sexually transmitted disease (STD) 04/12/2014  . Skin rash 10/11/2013  . HTN (hypertension) 10/11/2013  . Fibroid uterus 09/22/2013  . Healthcare maintenance 05/27/2013  . 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 Hearing loss 07/19/2012  . Meniere's disease of left ear 07/12/2012  . Dysmenorrhea 05/05/2012  . Iron deficiency anemia 02/04/2012  . Hypothyroidism (acquired) 01/14/2012  . Generalized anxiety disorder 01/14/2012    Alda Lea, PT 04/05/2015, 9:19 PM  [facility] [address] [place], [place], [address] Phone: [phone]   Fax:  [phone]

## 2015-04-05 NOTE — Patient Instructions (Addendum)
Feet Together (Compliant Surface) Varied Arm Positions - Eyes Open   With eyes open, standing on compliant surface: ____pilow____, feet together and arms out, look at a stationary object. Hold __30__ seconds. Repeat __3__ times per session. Do __2__ sessions per day.  Copyright  VHI. All rights reserved.  Balance: Eyes Closed - Bilateral (Varied Surfaces)   Stand, feet shoulder width, close eyes. Maintain balance __30__ seconds. Repeat ___3_ times per set. Do _2___ sets per session. Do ___5_ sessions per week. Repeat on compliant surface: foam.  Copyright  VHI. All rights reserved.

## 2015-04-13 ENCOUNTER — Telehealth: Payer: Self-pay | Admitting: *Deleted

## 2015-04-13 NOTE — Telephone Encounter (Signed)
Ultrasound shows fibroids/polyps, Dr. Nehemiah Settle recommends IUD.  Contacted patient, results given, pt agrees to placement of IUD. Will send message to ADMIN pool to schedule appointment and to inform the patient.

## 2015-04-16 ENCOUNTER — Ambulatory Visit: Payer: No Typology Code available for payment source | Admitting: Physical Therapy

## 2015-04-16 ENCOUNTER — Ambulatory Visit: Payer: No Typology Code available for payment source | Attending: Internal Medicine | Admitting: Physical Therapy

## 2015-04-16 DIAGNOSIS — M545 Low back pain, unspecified: Secondary | ICD-10-CM

## 2015-04-16 DIAGNOSIS — R2689 Other abnormalities of gait and mobility: Secondary | ICD-10-CM

## 2015-04-16 DIAGNOSIS — R42 Dizziness and giddiness: Secondary | ICD-10-CM

## 2015-04-16 NOTE — Progress Notes (Signed)
Cardiology Office Note   Date:  04/17/2015   ID:  Alyssa Hall, DOB Feb 03, 1986, MRN [id number]  PCP:  Osa Craver, MD  Cardiologist:   Sharol Harness, MD   Chief Complaint  Patient presents with  . New Evaluation    chest tightness, started last week, comes and goes  . Shortness of Breath    on exertion and at rest  . Palpitations    random, been going on for a while, constant. when going from sitting to standing position, "chest and head area starts to feel full and heart starts to beat fast."  . Pain    ankles bilateral, more on left      History of Present Illness: Alyssa Hall is a 29 y.o. female with HTN, atrial tachycardia, and hypothyroidism who presents for palpitations.  Alyssa Hall reports that her palpitations began at age 54. At that time she was living in [place]. They are associated with shortness of breath, lightheadedness and dizziness. She was having episodes daily until 2011 when she was diagnosed with hyperthyroidism. After treatment she became hypothyroid and has remained on levothyroxine since that time. In the past her palpitations were better controlled though now it is occurring constantly. H&H she experiences palpitations both at rest and with exertion, though it is much worse with exertion. Episodes last from 5-15 minutes and leave her extremely fatigued. Overall she is feeling very tired and has no energy to do anything. Even after awakening in the morning she doesn't feel like going to work. She does not drink any caffeine or alcohol.  She endorses orthopnea, PND and lower extremity edema.  Alyssa Hall was evaluated in the ED for syncope on 03/07/15. Her BMP was unremarkable other than a mildly elevated calcium level.  EKG showed an ectopic atrial rhythm at 69 bpm. She has had an ectopic atrial rhythm on several EKGs in the past.  Following that ED visit she followed up with her PCP who lowered her synthroid due to low TSH levels.  She was also started  on atenolol for BP control and HR management, as her HR was 102 at the time.  She has not noted any improvement in her symptoms since starting atenolol.  She also carries a diagnosis of vertigo and was referred to PT for vestibular rehab.  Alyssa Hall has not been exercising as she is    Past Medical History  Diagnosis Date  . Thyrotoxicosis   . Atrial tachycardia   . Dizziness   . Hypothyroid   . Depression   . Anxiety   . Meniere's disease   . Anemia   . Fibroids   . Hypertension     History reviewed. No pertinent past surgical history.   Current Outpatient Prescriptions  Medication Sig Dispense Refill  . acetaminophen (TYLENOL) 500 MG tablet Take 1,000 mg by mouth every 6 (six) hours as needed for mild pain or moderate pain.    Marland Kitchen atenolol (TENORMIN) 50 MG tablet Take 1 tablet (50 mg total) by mouth daily. 30 tablet 11  . levothyroxine (SYNTHROID, LEVOTHROID) 25 MCG tablet Take 1 tablet (25 mcg total) by mouth daily before breakfast. 30 tablet 11   No current facility-administered medications for this visit.    Allergies:   Review of patient's allergies indicates no known allergies.    Social History:  The patient  reports that she has never smoked. She has never used smokeless tobacco. She reports that she does not drink alcohol or use  illicit drugs.   Family History:  The patient's family history includes Heart failure in her maternal aunt.    ROS:  Please see the history of present illness.   Otherwise, review of systems are positive for none.   All other systems are reviewed and negative.    PHYSICAL EXAM: VS:  BP 120/82 mmHg  Pulse 83  Ht 5\' 3"  (1.6 m)  Wt 95.437 kg (210 lb 6.4 oz)  BMI 37.28 kg/m2 , BMI Body mass index is 37.28 kg/(m^2). GENERAL:  Well appearing HEENT:  Pupils equal round and reactive, fundi not visualized, oral mucosa unremarkable NECK:  No jugular venous distention, waveform within normal limits, carotid upstroke brisk and symmetric, no  bruits, no thyromegaly LYMPHATICS:  No cervical adenopathy LUNGS:  Clear to auscultation bilaterally HEART:  RRR.  PMI not displaced or sustained,S1 and S2 within normal limits, no S3, no S4, no clicks, no rubs, no murmurs ABD:  Flat, positive bowel sounds normal in frequency in pitch, no bruits, no rebound, no guarding, no midline pulsatile mass, no hepatomegaly, no splenomegaly EXT:  2 plus pulses throughout, no edema, no cyanosis no clubbing SKIN:  No rashes no nodules NEURO:  Cranial nerves II through XII grossly intact, motor grossly intact throughout PSYCH:  Cognitively intact, oriented to person place and time    EKG:  EKG is ordered today. The ekg ordered today demonstrates ectopic atrial rhythm at 83 bpm.    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 TTE 07/2014: EF 55-60%.  E-A fusion.  Recent Labs: 02/27/2015: ALT 13*; Platelets 368 03/07/2015: BUN 12; Creatinine, Ser 0.80; Hemoglobin 12.2; Potassium 4.2; Sodium 139 03/14/2015: TSH 0.015*    Lipid Panel No results found for: CHOL, TRIG, HDL, CHOLHDL, VLDL, LDLCALC, LDLDIRECT    Wt Readings from Last 3 Encounters:  04/17/15 95.437 kg (210 lb 6.4 oz)  03/29/15 95.709 kg (211 lb)  03/28/15 97.342 kg (214 lb 9.6 oz)      Other studies Reviewed: Additional studies/ records that were reviewed today include:  Review of the above records demonstrates:  Please see elsewhere in the note.     ASSESSMENT AND PLAN:  # Ectopic atrial rhythm:  Based on her ectopic morphology it is likely that her focus is in the crista terminalis or R pulmonary veins.  She mostly has documented ectopic atrial rhythm but not significant atrial tachycardia.  I'm suspicious that she is having runs of SVT or atrial tachycardia. Given the frequency of her symptoms 24-hour Holter should be sufficient to correlate symptoms with ECG. Caffeine is not a trigger for her and she does not drink alcohol.  Her PCP has been working with her to titrate synthroid, as her  last TSH was quite low. - 24H Holter - check TSH - will likely switch atenolol to metoprolol or discuss the possibility of antiarrhythmic vs ablation based on the results of her Holter - electrolytes wnl  Current medicines are reviewed at length with the patient today.  The patient does not have concerns regarding medicines.  The following changes have been made:  no change  Labs/ tests ordered today include: 24H Holter  Orders Placed This Encounter  Procedures  . TSH  . Holter monitor - 24 hour  . EKG 12-Lead     Disposition:   FU with Dr. Jonelle Sidle C. [place] in 3 months   Signed, Sharol Harness, MD  04/17/2015 5:11 PM    [place]

## 2015-04-16 NOTE — Patient Instructions (Signed)
Patient Instructions     Feet Together (Compliant Surface) Varied Arm Positions - Eyes Open   With eyes open, standing on compliant surface: ____pilow____, feet together and arms out, look at a stationary object. Hold __30__ seconds. Repeat __3__ times per session. Do __2__ sessions per day.  Copyright  VHI. All rights reserved.  Balance: Eyes Closed - Bilateral (Varied Surfaces)   Stand, feet shoulder width, close eyes. Maintain balance __30__ seconds. Repeat ___3_ times per set. Do _2___ sets per session. Do ___5_ sessions per week. Repeat on compliant surface: foam.  Copyright  VHI. All rights reserved.    Gaze Stabilization: Standing Feet Apart   Feet shoulder width apart, keeping eyes on target on wall __8__ feet away, tilt head down 15-30 and move head side to side for _60___ seconds. Repeat while moving head up and down for _60___ seconds. Do __5__ sessions per day. Repeat using target on pattern background.  Copyright  VHI. All rights reserved.  Gaze Stabilization: Tip Card 1.Target must remain in focus, not blurry, and appear stationary while head is in motion. 2.Perform exercises with small head movements (45 to either side of midline). 3.Increase speed of head motion so long as target is in focus. 4.If you wear eyeglasses, be sure you can see target through lens (therapist will give specific instructions for bifocal / progressive lenses). 5.These exercises may provoke dizziness or nausea. Work through these symptoms. If too dizzy, slow head movement slightly. Rest between each exercise. 6.Exercises demand concentration; avoid distractions. 7.For safety, perform standing exercises close to a counter, wall, corner, or next to someone.  Copyright  VHI. All rights reserved.  Isometric Abdominal   Lying on back with knees bent, tighten stomach by pressing belly button to spine. Hold _5_ seconds. Repeat __10__ times per set. Do __1__ sets per session. Do  __1-2__ sessions per day.  http://orth.exer.us/1086   Copyright  VHI. All rights reserved.  Bridging   Slowly raise buttocks from floor, keeping stomach tight. Repeat _10___ times per set. Do __1__ sets per session. Do _1-2___ sessions per day.  http://orth.exer.us/1096   Copyright  VHI. All rights reserved.  Lower Trunk Rotation Stretch   Keeping back flat and feet together, rotate knees to left side. Hold _3-5___ seconds. Repeat __10 times each way. Do __1__ sets per session. Do _1-2_ sessions per day.  http://orth.exer.us/122   Copyright  VHI. All rights reserved.  Knee-to-Chest Stretch: Unilateral   With hand behind right knee, pull knee in to chest until a comfortable stretch is felt in lower back and buttocks. Keep back relaxed. Hold __20__ seconds. Repeat __3__ times each side.  Do __1-2__ sessions per day.  http://orth.exer.us/126   Copyright  VHI. All rights reserved.  Knee-to-Chest Stretch: Bilateral   With hands behind knees, pull both knees in to chest until a comfortable stretch is felt in lower back and buttocks. Keep back relaxed. Hold _20_ seconds. Repeat __3__ times per set. Do __1__ sets per session. Do _1-2___ sessions per day.  http://orth.exer.us/128   Copyright  VHI. All rights reserved.  Chair Sitting   Sit at edge of seat, spine straight, one leg extended. Put a hand on each thigh and bend forward from the hip, keeping spine straight. Allow hand on extended leg to reach toward toes. Support upper body with other arm. Hold 20__ seconds. Repeat _3__ times each leg. Do 1-2_ sessions per day.  Copyright  VHI. All rights reserved.

## 2015-04-17 ENCOUNTER — Ambulatory Visit (INDEPENDENT_AMBULATORY_CARE_PROVIDER_SITE_OTHER): Payer: No Typology Code available for payment source | Admitting: Cardiovascular Disease

## 2015-04-17 ENCOUNTER — Encounter: Payer: Self-pay | Admitting: Cardiovascular Disease

## 2015-04-17 VITALS — BP 120/82 | HR 83 | Ht 63.0 in | Wt 210.4 lb

## 2015-04-17 DIAGNOSIS — E0590 Thyrotoxicosis, unspecified without thyrotoxic crisis or storm: Secondary | ICD-10-CM

## 2015-04-17 DIAGNOSIS — I4719 Other supraventricular tachycardia: Secondary | ICD-10-CM

## 2015-04-17 DIAGNOSIS — R002 Palpitations: Secondary | ICD-10-CM

## 2015-04-17 DIAGNOSIS — I471 Supraventricular tachycardia: Secondary | ICD-10-CM

## 2015-04-17 NOTE — Therapy (Signed)
Alyssa Hall [address] [place], [place], [address] Phone: [phone]   Fax:  [phone]  Physical Therapy Treatment  Patient Details  Name: Alyssa Hall MRN: [id number] Date of Birth: [birth year] Referring Provider:  Vickii Chafe, MD  Encounter Date: 04/16/2015      PT End of Session - 04/16/15 1859    Visit Number 2   Number of Visits 9   Date for PT Re-Evaluation 05/03/15   Authorization Type [employer] discount   PT Start Time 1230   PT Stop Time 1315   PT Time Calculation (min) 45 min   Activity Tolerance Patient tolerated treatment well   Behavior During Therapy Anxious;WFL for tasks assessed/performed  anxious on Balance Master only      Past Medical History  Diagnosis Date  . Thyrotoxicosis   . Atrial tachycardia   . Dizziness   . Hypothyroid   . Depression   . Anxiety   . Meniere's disease   . Anemia   . Fibroids   . Hypertension     No past surgical history on file.  There were no vitals filed for this visit.  Visit Diagnosis:  Dizziness and giddiness  Bilateral low back pain without sciatica  Abnormality of gait due to impairment of balance   Treatment: Reissued the back exercises issued from last session, along with her vestibular HEP from prior session's today due to pt reporting she lost all her copies. Had pt perform back exercises and provided minimal cues to correct her form and technique.   Neuro re-ed: sensory organization test performed with following results: Conditions: 1:  All above  2:  Above, above, below  3:  Below, below, fall 4:  Fall, below, above 5: all falls 6: all falls Composite score: 34  Sensory Analysis Som: above norm Vis: below norm (between 25-50) Vest: barley on chart (<5) Pref: below norm (between 50-75 line) Strategy analysis: hip > ankle strategy      COG alignment:  Posterior preference            PT Long Term Goals - 04/02/15 1649     PT LONG TERM GOAL #1   Title Dizziness Handicap Index test score will improve by at least 25% for improved mobility.  (05-03-15)   Time 4   Period Weeks   Status New   PT LONG TERM GOAL #2   Title Report decr. back pain to </= 6/10 for imrpoved mobility.   (05-03-15)   Time 4   Period Weeks   Status New   PT LONG TERM GOAL #3   Title Verbalize understanding of dietary recommendations to assist with Meniere's disease management. (05-03-15)   Time 4   Period Weeks   Status New   PT LONG TERM GOAL #4   Title Amb. 100' with vertigo </= 4/10 intensity while performing head turns at least 50' of this distance.  (05-03-15)   Time 4   Period Weeks   Status New   PT LONG TERM GOAL #5   Title Independent in HEP for vestibular exercises.   (05-03-15)   Time 4   Period Weeks   Status New           Plan - 04/16/15 1900    Clinical Impression Statement Pt reported decreased back pain after exercises today. Pt also able to complete the Sensory Organization Test today with frequent breaks to just look over her shoulder due to anxiety /clausterphobia. Results showed significanlty  decreased vestibular imput, as well as decreased visual imput.  Pt making steady progress toward goals.                               Pt will benefit from skilled therapeutic intervention in order to improve on the following deficits Difficulty walking;Decreased mobility;Impaired flexibility;Decreased balance;Dizziness   Rehab Potential Good   PT Frequency 2x / week   PT Duration 4 weeks   PT Treatment/Interventions ADLs/Self Care Home Management;Functional mobility training;Gait training;Therapeutic activities;Therapeutic exercise;Balance training;Neuromuscular re-education;Patient/family education;Vestibular   PT Next Visit Plan Continue with activiites to stimulate her visual and vestibular systems, limiting her somatosensory system.   PT Home Exercise Plan see above   Consulted and Agree with Plan of Care Patient         Problem List Patient Active Problem List   Diagnosis Date Noted  . Muscle strain 03/28/2015  . Blurry vision, bilateral 03/15/2015  . Ear popping 01/03/2015  . Atrial ectopic tachycardia 12/06/2014  . Exposure to sexually transmitted disease (STD) 04/12/2014  . Skin rash 10/11/2013  . HTN (hypertension) 10/11/2013  . Fibroid uterus 09/22/2013  . Healthcare maintenance 05/27/2013  . 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 Hearing loss 07/19/2012  . Meniere's disease of left ear 07/12/2012  . Dysmenorrhea 05/05/2012  . Iron deficiency anemia 02/04/2012  . Hypothyroidism (acquired) 01/14/2012  . Generalized anxiety disorder 01/14/2012    Alyssa Hall 04/17/2015, 7:03 PM  Alyssa Hall, PTA, [facility] [address], [place], [place] [address] [phone] 04/17/2015, 7:03 PM

## 2015-04-17 NOTE — Patient Instructions (Signed)
Your physician has recommended that you wear a holter monitor 24 HOUR . Holter monitors are medical devices that record the heart's electrical activity. Doctors most often use these monitors to diagnose arrhythmias. Arrhythmias are problems with the speed or rhythm of the heartbeat. The monitor is a small, portable device. You can wear one while you do your normal daily activities. This is usually used to diagnose what is causing palpitations/syncope (passing out).  LABS- TSH   Your physician wants you to follow-up in [place].  You will receive a reminder letter in the mail two months in advance. If you don't receive a letter, please call our office to schedule the follow-up appointment.

## 2015-04-18 LAB — TSH: TSH: 1.093 u[IU]/mL (ref 0.350–4.500)

## 2015-04-20 ENCOUNTER — Telehealth: Payer: Self-pay | Admitting: *Deleted

## 2015-04-20 DIAGNOSIS — E0590 Thyrotoxicosis, unspecified without thyrotoxic crisis or storm: Secondary | ICD-10-CM

## 2015-04-20 DIAGNOSIS — R002 Palpitations: Secondary | ICD-10-CM

## 2015-04-20 DIAGNOSIS — I4719 Other supraventricular tachycardia: Secondary | ICD-10-CM

## 2015-04-20 DIAGNOSIS — I471 Supraventricular tachycardia: Secondary | ICD-10-CM

## 2015-04-20 NOTE — Telephone Encounter (Signed)
-----   Message from Jennefer Bravo sent at 04/18/2015  8:33 AM EDT ----- Regarding: RE: [place],  Just have the patient scheduled to have the monitor applied at our office and specify in house monitor [facility] Discount.   My understanding is that the discount only applies to services supplied by [facility].  If I put one of our monitors on the patient and scan it myself, it should be covered by the [facility] discount.  Patient will be responsible for any portion [facility] does not pay.  Total cost of in house 24 hour holter monitor and interpretation is $325. Thanks, Darrick Penna ----- Message -----    From: Raiford Simmonds, RN    Sent: 04/17/2015   3:44 PM      To: Jennefer Bravo Subject: NEED ASSIST WITH HOLTER MONITOR                HELLO ,SHELLY I have a question concerning this patient. The patient was seen today by Dr Oval Linsey.  Dr Oval Linsey wanted the patient to wear a 24 hour monitor. Patient does not have insurance "[employer] "[place]- ( [place]) Do you know of how I can get a monitor for her ?  Thanks

## 2015-04-20 NOTE — Telephone Encounter (Signed)
-----   Message from Skeet Latch, MD sent at 04/18/2015 10:23 AM EDT ----- Thyroid function normal.  No change to current medications.

## 2015-04-20 NOTE — Telephone Encounter (Signed)
Left messge to call back about labs and monitor

## 2015-04-23 ENCOUNTER — Ambulatory Visit: Payer: No Typology Code available for payment source | Admitting: Physical Therapy

## 2015-04-23 DIAGNOSIS — R42 Dizziness and giddiness: Secondary | ICD-10-CM

## 2015-04-23 NOTE — Telephone Encounter (Signed)
NEW ORDER PLACED FOR 24 HOUR MONITOR-PALP.

## 2015-04-23 NOTE — Telephone Encounter (Signed)
spoke to patient  Result of TSH given.  information concerning placing  24 hour monitor at discount. Patient is willing to go forward with placement.

## 2015-04-24 ENCOUNTER — Encounter: Payer: Self-pay | Admitting: Physical Therapy

## 2015-04-24 NOTE — Therapy (Signed)
[facility] [address] [place], [place], [address] Phone: [phone]   Fax:  [phone]  Physical Therapy Treatment  Patient Details  Name: Alyssa Hall MRN: [id number] Date of Birth: 02/05/1986 Referring Provider:  Vickii Chafe, MD  Encounter Date: 04/23/2015      PT End of Session - 04/24/15 2122    Visit Number 4   Number of Visits 9   Date for PT Re-Evaluation 05/03/15   Authorization Type [employer] discount   PT Start Time 1147   PT Stop Time 1230   PT Time Calculation (min) 43 min      Past Medical History  Diagnosis Date  . Thyrotoxicosis   . Atrial tachycardia   . Dizziness   . Hypothyroid   . Depression   . Anxiety   . Meniere's disease   . Anemia   . Fibroids   . Hypertension     History reviewed. No pertinent past surgical history.  There were no vitals filed for this visit.  Visit Diagnosis:  Dizziness and giddiness      Subjective Assessment - 04/24/15 2119    Subjective Pt. reports no significant change in vertigo - states some days she feels it is getting worse; has an opthalmology appt at end of August   Pertinent History atrial tachycardia, anxiety, Meniere's disease   Patient Stated Goals Reduce vertigo and decrease back pain; improve balance   Currently in Pain? No/denies                              Balance Exercises - 04/24/15 2121    Balance Exercises: Standing   Standing Eyes Opened Narrow base of support (BOS);Wide (BOA);Head turns;Foam/compliant surface;1 rep;20 secs   Standing Eyes Closed Narrow base of support (BOS);Wide (BOA);Head turns;Foam/compliant surface;1 rep;10 secs     Self Care; discussed etiology and dietary management of Meniere's disease; discussed need for walking programm to assist  in stress/anxiety management; discussed need for medical management if diagnosis is definitely Meniere's disease and need for ENT Consult/  medication management with this disease           PT Long Term Goals - 04/02/15 1649    PT LONG TERM GOAL #1   Title Dizziness Handicap Index test score will improve by at least 25% for improved mobility.  (05-03-15)   Time 4   Period Weeks   Status New   PT LONG TERM GOAL #2   Title Report decr. back pain to </= 6/10 for imrpoved mobility.   (05-03-15)   Time 4   Period Weeks   Status New   PT LONG TERM GOAL #3   Title Verbalize understanding of dietary recommendations to assist with Meniere's disease management. (05-03-15)   Time 4   Period Weeks   Status New   PT LONG TERM GOAL #4   Title Amb. 100' with vertigo </= 4/10 intensity while performing head turns at least 50' of this distance.  (05-03-15)   Time 4   Period Weeks   Status New   PT LONG TERM GOAL #5   Title Independent in HEP for vestibular exercises.   (05-03-15)   Time 4   Period Weeks   Status New               Plan - 04/24/15 2123    Clinical Impression Statement Pt. has signs of Meniere's disease accompanied by anxiety; reports  very minimal back pain; discussed importance of diet in management of Meniere's disease; also discussed need for walking program   Pt will benefit from skilled therapeutic intervention in order to improve on the following deficits Difficulty walking;Decreased mobility;Impaired flexibility;Decreased balance;Dizziness   Rehab Potential Good   PT Frequency 2x / week   PT Duration 4 weeks   PT Treatment/Interventions ADLs/Self Care Home Management;Functional mobility training;Gait training;Therapeutic activities;Therapeutic exercise;Balance training;Neuromuscular re-education;Patient/family education;Vestibular   PT Next Visit Plan cont with vestibular exercises   PT Home Exercise Plan see above   Consulted and Agree with Plan of Care Patient        Problem List Patient Active Problem List   Diagnosis Date Noted  . Muscle strain 03/28/2015  . Blurry vision, bilateral  03/15/2015  . Ear popping 01/03/2015  . Atrial ectopic tachycardia 12/06/2014  . Exposure to sexually transmitted disease (STD) 04/12/2014  . Skin rash 10/11/2013  . HTN (hypertension) 10/11/2013  . Fibroid uterus 09/22/2013  . Healthcare maintenance 05/27/2013  . 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 Hearing loss 07/19/2012  . Meniere's disease of left ear 07/12/2012  . Dysmenorrhea 05/05/2012  . Iron deficiency anemia 02/04/2012  . Hypothyroidism (acquired) 01/14/2012  . Generalized anxiety disorder 01/14/2012    Alda Lea, PT 04/24/2015, 9:31 PM  [facility] [address] [place], [place], [address] Phone: [phone]   Fax:  [phone]

## 2015-04-27 ENCOUNTER — Ambulatory Visit (INDEPENDENT_AMBULATORY_CARE_PROVIDER_SITE_OTHER): Payer: No Typology Code available for payment source

## 2015-04-27 DIAGNOSIS — E0590 Thyrotoxicosis, unspecified without thyrotoxic crisis or storm: Secondary | ICD-10-CM

## 2015-04-27 DIAGNOSIS — I471 Supraventricular tachycardia: Secondary | ICD-10-CM

## 2015-04-27 DIAGNOSIS — R002 Palpitations: Secondary | ICD-10-CM

## 2015-04-30 ENCOUNTER — Ambulatory Visit: Payer: No Typology Code available for payment source | Admitting: Physical Therapy

## 2015-04-30 ENCOUNTER — Encounter: Payer: Self-pay | Admitting: Internal Medicine

## 2015-04-30 DIAGNOSIS — R42 Dizziness and giddiness: Secondary | ICD-10-CM

## 2015-05-01 ENCOUNTER — Encounter: Payer: Self-pay | Admitting: Physical Therapy

## 2015-05-01 NOTE — Therapy (Signed)
[facility] [address] [place], [place], [address] Phone: [phone]   Fax:  [phone]  Physical Therapy Treatment  Patient Details  Name: Alyssa Hall MRN: [id number] Date of Birth: 02-15-1986 Referring Provider:  Vickii Chafe, MD  Encounter Date: 04/30/2015      PT End of Session - 05/01/15 1202    Visit Number 5   Number of Visits 9   Date for PT Re-Evaluation 05/03/15   Authorization Type [employer] discount   PT Start Time 1535   PT Stop Time 1620   PT Time Calculation (min) 45 min      Past Medical History  Diagnosis Date  . Thyrotoxicosis   . Atrial tachycardia   . Dizziness   . Hypothyroid   . Depression   . Anxiety   . Meniere's disease   . Anemia   . Fibroids   . Hypertension     History reviewed. No pertinent past surgical history.  There were no vitals filed for this visit.  Visit Diagnosis:  Dizziness and giddiness      Subjective Assessment - 05/01/15 1159    Subjective Pt. reports that she feels the vertigo is a little bit better; has appt. with MD on 05-02-15   Pertinent History atrial tachycardia, anxiety, Meniere's disease   Patient Stated Goals Reduce vertigo and decrease back pain; improve balance   Currently in Pain? No/denies         NeuroRe-ed:  Sensory Organization Test score - composite score 15/100 with N= 70/100; somatosensory 55/100 with N= 88/100   Minimal visual and vestibular inputs with N= 73/100 for visual and 55 is N for vestibular Pt.became nauseaus and sick with testing - requested to finish test but became sick when completing test; felt better after getting sick  Pt's score on SOT on 04-30-15 not as good as test score on 04-17-15 - even though pt reported feeling better on 04-30-15  Pt is to discuss status and recommendation for ENT consult due to Meniere's diagnosis - PT does not appear to be significantly helping to  Reduce vertigo at this  time  Reviewed HEP with pt (balance on foam)                           PT Long Term Goals - 05/01/15 1203    PT LONG TERM GOAL #1   Title Dizziness Handicap Index test score will improve by at least 25% for improved mobility.  (05-03-15)   Baseline Pt reports minimal improvement in vertigo; motion sensitivity persists and provokes moderate to severe vertigo   Status Not Met   PT LONG TERM GOAL #2   Title Report decr. back pain to </= 6/10 for imrpoved mobility.   (05-03-15)   Baseline no pain reported on 04-30-15   Status Achieved   PT LONG TERM GOAL #3   Title Verbalize understanding of dietary recommendations to assist with Meniere's disease management. (05-03-15)   Baseline met 04-30-15   Status Achieved   PT LONG TERM GOAL #4   Title Amb. 100' with vertigo </= 4/10 intensity while performing head turns at least 50' of this distance.  (05-03-15)   Baseline pt states head turns cont to provoke vertigo - request not to attempt this task    Status Not Met   PT LONG TERM GOAL #5   Title Independent in HEP for vestibular exercises.   (05-03-15)   Status Achieved  Plan - 05/01/15 1211    Clinical Impression Statement Pt. has met LTG's #2,3, and 5;  #1 and #4 not met due to cont. c/o vertigo and motion sensitivity and c/o fullness in her ears/head   Pt will benefit from skilled therapeutic intervention in order to improve on the following deficits Difficulty walking;Decreased mobility;Impaired flexibility;Decreased balance;Dizziness   Rehab Potential Good   PT Frequency 2x / week   PT Duration 4 weeks   PT Treatment/Interventions ADLs/Self Care Home Management;Functional mobility training;Gait training;Therapeutic activities;Therapeutic exercise;Balance training;Neuromuscular re-education;Patient/family education;Vestibular   PT Next Visit Plan pt. to follow up with MD - recommend consult by ENT for medical management of Meniere's disease    PT Home  Exercise Plan balance on foam with EO and EC   Consulted and Agree with Plan of Care Patient        Problem List Patient Active Problem List   Diagnosis Date Noted  . Muscle strain 03/28/2015  . Blurry vision, bilateral 03/15/2015  . Ear popping 01/03/2015  . Atrial ectopic tachycardia 12/06/2014  . Exposure to sexually transmitted disease (STD) 04/12/2014  . Skin rash 10/11/2013  . HTN (hypertension) 10/11/2013  . Fibroid uterus 09/22/2013  . Healthcare maintenance 05/27/2013  . 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 Hearing loss 07/19/2012  . BPPV (benign paroxysmal positional vertigo) 07/12/2012  . Dysmenorrhea 05/05/2012  . Iron deficiency anemia 02/04/2012  . Hypothyroidism (acquired) 01/14/2012  . Generalized anxiety disorder 01/14/2012    Alda Lea, PT 05/01/2015, 12:16 PM  [facility] [address] [place], [place], [address] Phone: [phone]   Fax:  [phone]

## 2015-05-02 ENCOUNTER — Ambulatory Visit (INDEPENDENT_AMBULATORY_CARE_PROVIDER_SITE_OTHER): Payer: No Typology Code available for payment source | Admitting: Internal Medicine

## 2015-05-02 ENCOUNTER — Encounter: Payer: Self-pay | Admitting: Internal Medicine

## 2015-05-02 VITALS — BP 116/52 | HR 82 | Temp 98.4°F | Ht 63.0 in | Wt 213.6 lb

## 2015-05-02 DIAGNOSIS — I10 Essential (primary) hypertension: Secondary | ICD-10-CM

## 2015-05-02 DIAGNOSIS — H8110 Benign paroxysmal vertigo, unspecified ear: Secondary | ICD-10-CM

## 2015-05-02 DIAGNOSIS — E039 Hypothyroidism, unspecified: Secondary | ICD-10-CM

## 2015-05-02 DIAGNOSIS — I471 Supraventricular tachycardia: Secondary | ICD-10-CM

## 2015-05-02 DIAGNOSIS — H538 Other visual disturbances: Secondary | ICD-10-CM

## 2015-05-02 DIAGNOSIS — N946 Dysmenorrhea, unspecified: Secondary | ICD-10-CM

## 2015-05-02 NOTE — Assessment & Plan Note (Signed)
Patient is on Synthroid 25 mcg daily. TSH on 04/17/15 was 1.093.   Plan: -Continue synthroid 25 mcg daily

## 2015-05-02 NOTE — Assessment & Plan Note (Signed)
Patient has appointment with Ophthalmologist on 05/09/15.

## 2015-05-02 NOTE — Assessment & Plan Note (Addendum)
Patient was seen by Cardiology who agrees with atrial ectopic tachycardia. Patient is currently rate controlled on atenolol 50 mg daily. Per Cardiology notes, Dr. Oval Linsey has considered changing her from labetalol to metoprolol after Holter Monitor study. Palpitations and heart rate controlled.   Plan: -Follow up with Cardiology -Continue atenolol 50 mg daily

## 2015-05-02 NOTE — Patient Instructions (Signed)
CONTINUE TAKING ALL MEDICATIONS THE SAME  REFERRAL HAS BEEN MADE TO ENT  RETURN IN 3 MONTHS  Intrauterine Device Information An intrauterine device (IUD) is inserted into your uterus to prevent pregnancy. There are two types of IUDs available:   Copper IUD--This type of IUD is wrapped in copper wire and is placed inside the uterus. Copper makes the uterus and fallopian tubes produce a fluid that kills sperm. The copper IUD can stay in place for 10 years.  Hormone IUD--This type of IUD contains the hormone progestin (synthetic progesterone). The hormone thickens the cervical mucus and prevents sperm from entering the uterus. It also thins the uterine lining to prevent implantation of a fertilized egg. The hormone can weaken or kill the sperm that get into the uterus. One type of hormone IUD can stay in place for 5 years, and another type can stay in place for 3 years. Your health care provider will make sure you are a good candidate for a contraceptive IUD. Discuss with your health care provider the possible side effects.  ADVANTAGES OF AN INTRAUTERINE DEVICE  IUDs are highly effective, reversible, long acting, and low maintenance.   There are no estrogen-related side effects.   An IUD can be used when breastfeeding.   IUDs are not associated with weight gain.   The copper IUD works immediately after insertion.   The hormone IUD works right away if inserted within 7 days of your period starting. You will need to use a backup method of birth control for 7 days if the hormone IUD is inserted at any other time in your cycle.  The copper IUD does not interfere with your female hormones.   The hormone IUD can make heavy menstrual periods lighter and decrease cramping.   The hormone IUD can be used for 3 or 5 years.   The copper IUD can be used for 10 years. DISADVANTAGES OF AN INTRAUTERINE DEVICE  The hormone IUD can be associated with irregular bleeding patterns.   The  copper IUD can make your menstrual flow heavier and more painful.   You may experience cramping and vaginal bleeding after insertion.  Document Released: 07/29/2004 Document Revised: 04/27/2013 Document Reviewed: 02/13/2013 [facility] Patient Information 2015 [place], [place]. This information is not intended to replace advice given to you by your health care provider. Make sure you discuss any questions you have with your health care provider.

## 2015-05-02 NOTE — Assessment & Plan Note (Signed)
Patient followed up with OB/GYN who recommended IUD to help control patient's symptoms of dysmenorrhea. Ultrasound confirmed uterine fibroids. Patient is hesitant to start IUD out of fear of weight gain and other potential side effects. She is going to continue to think about it and follow up with OB/GYN.   Plan: -Follow up with OB/GYN

## 2015-05-02 NOTE — Progress Notes (Signed)
   Subjective:    [name], female    DOB: 1985-12-27, 30 y.o.   MRN: [id number]  HPI [name] is a 29 yo female with PMHx of HTN, hypothyroidism, atrial ectopic tachycardia and BPPV who presents for follow up of hypothyroidism. Please see problem oriented assessment and plan for more information.   Review of Systems General: Denies fever, chills, fatigue Respiratory: Denies SOB, cough, DOE, chest tightness  Cardiovascular: Admits to occasional palpitation (improved). Denies chest pain.  Gastrointestinal: Denies nausea, vomiting, abdominal pain Genitourinary: Admits to dysmenorrhea.  Skin: Denies pallor, rash and wounds.  Neurological: Denies dizziness, headaches, weakness, lightheadedness  Past Medical History  Diagnosis Date  . Thyrotoxicosis   . Atrial tachycardia   . Dizziness   . Hypothyroid   . Depression   . Anxiety   . Meniere's disease   . Anemia   . Fibroids   . Hypertension    Outpatient Encounter Prescriptions as of 05/02/2015  Medication Sig  . acetaminophen (TYLENOL) 500 MG tablet Take 1,000 mg by mouth every 6 (six) hours as needed for mild pain or moderate pain.  Marland Kitchen atenolol (TENORMIN) 50 MG tablet Take 1 tablet (50 mg total) by mouth daily.  Marland Kitchen levothyroxine (SYNTHROID, LEVOTHROID) 25 MCG tablet Take 1 tablet (25 mcg total) by mouth daily before breakfast.   No facility-administered encounter medications on file as of 05/02/2015.      Objective:   Physical Exam Filed Vitals:   05/02/15 1432  BP: 116/52  Pulse: 82  Temp: 98.4 F (36.9 C)  TempSrc: Oral  Height: 5\' 3"  (1.6 m)  Weight: 213 lb 9.6 oz (96.888 kg)  SpO2: 100%   General: Vital signs reviewed.  Patient is well-developed and well-nourished, in no acute distress and cooperative with exam.  Cardiovascular: RRR, S1 normal, S2 normal, no murmurs, gallops, or rubs. Pulmonary/Chest: Clear to auscultation bilaterally, no wheezes, rales, or rhonchi. Abdominal: Soft, non-tender,  non-distended, BS + Extremities: No lower extremity edema bilaterally, pulses symmetric and intact bilaterally.  Neurological: A&O x3 Skin: Warm, dry and intact. No rashes or erythema. Psychiatric: Normal mood and affect. speech and behavior is normal. Cognition and memory are normal.     Assessment & Plan:   Please see problem oriented assessment and plan.

## 2015-05-02 NOTE — Assessment & Plan Note (Signed)
Patient followed up with vestibular rehab who recommended referral to ENT. VR thinks symptoms and testing are more consistent with Meniere's Disease rather than BPPV. Apparently, vestibular testing is significantly diminished.   Plan: -Referral to ENT

## 2015-05-02 NOTE — Assessment & Plan Note (Signed)
BP Readings from Last 3 Encounters:  05/02/15 116/52  04/17/15 120/82  03/29/15 121/66    Lab Results  Component Value Date   NA 139 03/07/2015   K 4.2 03/07/2015   CREATININE 0.80 03/07/2015    Assessment: Blood pressure control:  Well controlled on atenolol 50 mg daily  Progress toward BP goal:   At goal Comments: On atenolol 50 mg daily.   Plan: Medications:  continue current medications

## 2015-05-03 ENCOUNTER — Telehealth: Payer: Self-pay | Admitting: *Deleted

## 2015-05-03 MED ORDER — DILTIAZEM HCL ER 120 MG PO CP24: 120.0000 mg | ORAL_CAPSULE | Freq: Every day | ORAL | Status: DC

## 2015-05-03 NOTE — Telephone Encounter (Signed)
Spoke to patient. Result given. Discontinue Atenolol and start DILTIAZEM ER 120 MG PO DAILY . Verbalized understanding appointment schedule for 06/06/15 at 3:30 pm

## 2015-05-03 NOTE — Telephone Encounter (Signed)
-----   Message from Skeet Latch, MD sent at 05/01/2015 11:45 PM EDT ----- Holter shows several episodes of irregular heart rate.  None of the rhythms are dangerous but could be causing her symptoms.  Please ask her to stop atenolol and start diltiazem CR 120mg  daily.  Hopefully this will help control her heart rhythm.  Please schedule for follow up in 1 month.

## 2015-05-04 NOTE — Progress Notes (Signed)
Internal Medicine Clinic Attending  Case discussed with Dr. Richardson at the time of the visit.  We reviewed the resident's history and exam and pertinent patient test results.  I agree with the assessment, diagnosis, and plan of care documented in the resident's note. 

## 2015-05-08 ENCOUNTER — Ambulatory Visit: Payer: Self-pay | Admitting: Cardiovascular Disease

## 2015-06-06 ENCOUNTER — Encounter: Payer: Self-pay | Admitting: Cardiovascular Disease

## 2015-06-06 ENCOUNTER — Ambulatory Visit (INDEPENDENT_AMBULATORY_CARE_PROVIDER_SITE_OTHER): Payer: No Typology Code available for payment source | Admitting: Cardiovascular Disease

## 2015-06-06 VITALS — BP 118/78 | HR 105 | Ht 64.0 in | Wt 210.0 lb

## 2015-06-06 DIAGNOSIS — I10 Essential (primary) hypertension: Secondary | ICD-10-CM

## 2015-06-06 DIAGNOSIS — I471 Supraventricular tachycardia: Secondary | ICD-10-CM

## 2015-06-06 NOTE — Patient Instructions (Signed)
Dr [place] recommends that you follow-up with her as needed. 

## 2015-06-06 NOTE — Progress Notes (Signed)
Cardiology Office Note   Date:  06/06/2015   ID:  Alyssa Hall, DOB [birth year], MRN [id number]  PCP:  Osa Craver, MD  Cardiologist:   Sharol Harness, MD   Chief Complaint  Patient presents with  . Follow-up  . Dizziness  . Shortness of Breath      History of Present Illness: Alyssa Hall is a 29 y.o. female with HTN, atrial tachycardia, and hypothyroidism who presents for follow up on her palpitations.  Alyssa Hall was seen in clinic one month ago reporting palpitations. We checked her thyroid level which was normal and switched her atenolol to diltiazem. Prior to making this medication change we also obtained a Holter which revealed sinus arrhythmia and one PAC but no other abnormalities. She did report symptoms during the testing time.   Since switching to diltiazem she has not noted any change in her symptoms. She continues to report frequent palpitations associated with shortness of breath, lightheadedness, and dizziness. She endorses significant anxiety and stress. She feels as though everyone in her family is against her and does not have a good relationship with them right now. She has one friend with whom she can talk, but not many other outlets.    Past Medical History  Diagnosis Date  . Thyrotoxicosis   . Atrial tachycardia   . Dizziness   . Hypothyroid   . Depression   . Anxiety   . Meniere's disease   . Anemia   . Fibroids   . Hypertension     History reviewed. No pertinent past surgical history.   Current Outpatient Prescriptions  Medication Sig Dispense Refill  . acetaminophen (TYLENOL) 500 MG tablet Take 1,000 mg by mouth every 6 (six) hours as needed for mild pain or moderate pain.    Marland Kitchen diltiazem (DILACOR XR) 120 MG 24 hr capsule Take 1 capsule (120 mg total) by mouth daily. 30 capsule 6  . levothyroxine (SYNTHROID, LEVOTHROID) 25 MCG tablet Take 1 tablet (25 mcg total) by mouth daily before breakfast. 30 tablet 11  .  triamterene-hydrochlorothiazide (DYAZIDE) 37.5-25 MG capsule Take 1 capsule by mouth daily.     No current facility-administered medications for this visit.    Allergies:   Review of patient's allergies indicates no known allergies.    Social History:  The patient  reports that she has never smoked. She has never used smokeless tobacco. She reports that she does not drink alcohol or use illicit drugs.   Family History:  The patient's family history includes Heart failure in her maternal aunt.    ROS:  Please see the history of present illness.   Otherwise, review of systems are positive for none.   All other systems are reviewed and negative.    PHYSICAL EXAM: VS:  BP 118/78 mmHg  Pulse 105  Ht 5\' 4"  (1.626 m)  Wt 95.255 kg (210 lb)  BMI 36.03 kg/m2 , BMI Body mass index is 36.03 kg/(m^2). GENERAL:  Well appearing HEENT:  Pupils equal round and reactive, fundi not visualized, oral mucosa unremarkable NECK:  No jugular venous distention, waveform within normal limits, carotid upstroke brisk and symmetric, no bruits, no thyromegaly LYMPHATICS:  No cervical adenopathy LUNGS:  Clear to auscultation bilaterally HEART:  RRR.  PMI not displaced or sustained,S1 and S2 within normal limits, no S3, no S4, no clicks, no rubs, no murmurs ABD:  Flat, positive bowel sounds normal in frequency in pitch, no bruits, no rebound, no guarding, no midline pulsatile mass, no  hepatomegaly, no splenomegaly EXT:  2 plus pulses throughout, no edema, no cyanosis no clubbing SKIN:  No rashes no nodules NEURO:  Cranial nerves II through XII grossly intact, motor grossly intact throughout PSYCH:  Cognitively intact, oriented to person place and time    EKG:  EKG is not ordered today.  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 TTE 07/2014: EF 55-60%.  E-A fusion.  Recent Labs: 02/27/2015: ALT 13*; Platelets 368 03/07/2015: BUN 12; Creatinine, Ser 0.80; Hemoglobin 12.2; Potassium 4.2; Sodium 139 04/17/2015: TSH  1.093    Lipid Panel No results found for: CHOL, TRIG, HDL, CHOLHDL, VLDL, LDLCALC, LDLDIRECT    Wt Readings from Last 3 Encounters:  06/06/15 95.255 kg (210 lb)  05/02/15 96.888 kg (213 lb 9.6 oz)  04/17/15 95.437 kg (210 lb 6.4 oz)      Other studies Reviewed: Additional studies/ records that were reviewed today include:  Review of the above records demonstrates:  Please see elsewhere in the note.     ASSESSMENT AND PLAN:  # Ectopic atrial rhythm:  Although Alyssa Hall does have a history of ectopic atrial rhythm, her Holter did not reveal any significant tachyarrhythmias. She does have some sinus arrhythmia, which is likely the cause of her symptoms. Given the fact that she was symptomatically during the monitoring but did not have any significant arrhythmias, I'm suspicious that her symptoms are actually due to anxiety. Thyroid function and other labs were within normal limits when last checked. I suggested that she talk with her primary care doctor and consider seeing a psychiatrist to address her anxiety. I offered her the option of discontinuing diltiazem. She thinks that she would like to stay on it for at least a few months and consider stopping it in the future as needed.  # Hypertension: BP well-controlled on triamterene.    Current medicines are reviewed at length with the patient today.  The patient does not have concerns regarding medicines.  The following changes have been made:  no change  Labs/ tests ordered today include: none  No orders of the defined types were placed in this encounter.     Disposition:   FU with Dr. Jonelle Sidle C. Oval Linsey as needed.   Signed, Sharol Harness, MD  06/06/2015 3:33 PM    [place]

## 2015-06-13 ENCOUNTER — Ambulatory Visit (INDEPENDENT_AMBULATORY_CARE_PROVIDER_SITE_OTHER): Payer: No Typology Code available for payment source | Admitting: Internal Medicine

## 2015-06-13 ENCOUNTER — Encounter: Payer: Self-pay | Admitting: Internal Medicine

## 2015-06-13 VITALS — BP 135/83 | HR 100 | Temp 98.2°F | Ht 63.0 in | Wt 209.0 lb

## 2015-06-13 DIAGNOSIS — N920 Excessive and frequent menstruation with regular cycle: Secondary | ICD-10-CM

## 2015-06-13 DIAGNOSIS — I10 Essential (primary) hypertension: Secondary | ICD-10-CM

## 2015-06-13 DIAGNOSIS — N898 Other specified noninflammatory disorders of vagina: Secondary | ICD-10-CM

## 2015-06-13 DIAGNOSIS — I4719 Other supraventricular tachycardia: Secondary | ICD-10-CM

## 2015-06-13 DIAGNOSIS — I471 Supraventricular tachycardia: Secondary | ICD-10-CM

## 2015-06-13 DIAGNOSIS — F411 Generalized anxiety disorder: Secondary | ICD-10-CM

## 2015-06-13 DIAGNOSIS — H8109 Meniere's disease, unspecified ear: Secondary | ICD-10-CM

## 2015-06-13 DIAGNOSIS — D509 Iron deficiency anemia, unspecified: Secondary | ICD-10-CM

## 2015-06-13 DIAGNOSIS — H538 Other visual disturbances: Secondary | ICD-10-CM

## 2015-06-13 DIAGNOSIS — Z0000 Encounter for general adult medical examination without abnormal findings: Secondary | ICD-10-CM

## 2015-06-13 MED ORDER — SERTRALINE HCL 25 MG PO TABS: 25.0000 mg | ORAL_TABLET | Freq: Every day | ORAL | Status: DC

## 2015-06-13 NOTE — Progress Notes (Signed)
Medicine attending: Medical history, presenting problems, physical findings, and medications, reviewed with Dr Alexa Richardson and I concur with her evaluation and management plan. 

## 2015-06-13 NOTE — Assessment & Plan Note (Addendum)
Patient has a history of lightheadedness and vertigo. She was referred to Vestibular Rehab who felt symptoms were consistent with Meniere's disease and recommended ENT referral. ENT saw the patient referred her to Audiology. ENT saw the patient, confirmed her diagnosis of Meniere's disease, prescribed triamterene-HCTZ for her symptoms, and referred her to Audiology. Patient reports symptoms are better controlled with the medication, but it does make her fatigued.   Plan: -Follow up with Audiology -Follow up with ENT -Continue triamterene-HCTZ

## 2015-06-13 NOTE — Assessment & Plan Note (Addendum)
History of iron deficiency anemia 2/2 menorrhagia. Last hemoglobin 11.7 in June 2016. Patient reports eating more ice lately and would like her hemoglobin checked.   Plan: -CBC -Consider IUD with Ob/gyn for menorrhagia  Addendum: Hemoglobin 11.7>9.7 in 3 months. Patient was called and only reports heavy menstrual periods, no other signs of blood loss. CBC consistent with microcytic anemia likely secondary to iron deficiency anemia from menorrhagia. Anemia panel in 2015 showed Fe 22 (low), ferritin 8 (low). Patient was previously on iron supp, but not currently.  Plan: -Add on Ferritin and iron -Patient to follow up with OB/GYN -Restart iron supp -Prescribe miralax in case on constipation

## 2015-06-13 NOTE — Progress Notes (Signed)
Subjective:    [name], female    DOB: 1986/05/27, 29 y.o.   MRN: [id number]  HPI Alyssa Hall is a 29 y.o. female with PMHx of hypothyroidism, HTN, anxiety, Meniere's disease who presents to the clinic for her pap smear. Please see A&P for the status of the patient's chronic medical problems.   Patient was seen by Cardiology after symptoms of palpitations, tachycardia, and EKG revealed ectopic atrial tachycardia. Holter monitor was normal. She does have a sinus arrhythmia which could be causing her symptoms. TSH normal. Cardiology wonders if her symptoms are being caused by anxiety. Patient could come off of diltiazem since holter monitor was normal, but patient wanted to continue it. Patient reports she has no longer been taking it. She is interested in starting something for anxiety.  Patient has a history of lightheadedness and vertigo. She was referred to Vestibular Rehab who felt symptoms were consistent with Meniere's disease and recommended ENT referral. ENT saw the patient, confirmed her diagnosis of Meniere's disease, prescribed triamterene-HCTZ for her symptoms, and referred her to Audiology. Patient reports symptoms are better controlled with the medication, but it does make her fatigued.   Patient does have history of anxiety, but has has opted to try conservative measures as she no longer wished to be on Zoloft. Patient is agreeable to starting Zoloft.   Past Medical History  Diagnosis Date  . Thyrotoxicosis   . Atrial tachycardia ([place])   . Dizziness   . Hypothyroid   . Depression   . Anxiety   . Meniere's disease   . Anemia   . Fibroids   . Hypertension     Outpatient Encounter Prescriptions as of 06/13/2015  Medication Sig  . acetaminophen (TYLENOL) 500 MG tablet Take 1,000 mg by mouth every 6 (six) hours as needed for mild pain or moderate pain.  Marland Kitchen levothyroxine (SYNTHROID, LEVOTHROID) 25 MCG tablet Take 1 tablet (25 mcg total) by mouth daily before  breakfast.  . sertraline (ZOLOFT) 25 MG tablet Take 1 tablet (25 mg total) by mouth daily.  Marland Kitchen triamterene-hydrochlorothiazide (DYAZIDE) 37.5-25 MG capsule Take 1 capsule by mouth daily.  . [DISCONTINUED] diltiazem (DILACOR XR) 120 MG 24 hr capsule Take 1 capsule (120 mg total) by mouth daily.   No facility-administered encounter medications on file as of 06/13/2015.    Family History  Problem Relation Age of Onset  . Heart failure Maternal Aunt     Social History   Social History  . Marital Status: Single    Spouse Name: N/A  . Number of Children: N/A  . Years of Education: N/A   Occupational History  . Not on file.   Social History Main Topics  . Smoking status: Never Smoker   . Smokeless tobacco: Never Used  . Alcohol Use: No  . Drug Use: No  . Sexual Activity: Not Currently    Birth Control/ Protection: None   Other Topics Concern  . Not on file   Social History Narrative   Uninsured.   Lives in [place].         Review of Systems General: Admits to fatigue. Denies fever, chills, change in appetite and diaphoresis.  Respiratory: Denies SOB, cough, DOE   Cardiovascular: Admits to occasional palpitations. Denies chest pain.  Gastrointestinal: Denies nausea, vomiting, abdominal pain, diarrhea, constipation GU: Admits to menorrhagia.  Skin: Denies pallor, rash and wounds.  Neurological: Admits to occasional dizziness. Denies headaches, weakness, numbness Psychiatric/Behavioral: Admits to anxiety  Objective:   Physical Exam Filed Vitals:   06/13/15 1459  BP: 135/83  Pulse: 100  Temp: 98.2 F (36.8 C)  TempSrc: Oral  Height: 5\' 3"  (1.6 m)  Weight: 209 lb (94.802 kg)  SpO2: 100%   General: Vital signs reviewed.  Patient is well-developed and well-nourished, in no acute distress and cooperative with exam.  Cardiovascular: Tachycardic, S1 normal, S2 normal, no murmurs, gallops, or rubs. Pulmonary/Chest: Clear to auscultation bilaterally, no wheezes,  rales, or rhonchi. Abdominal: Soft, non-tender, non-distended, BS + Extremities: No lower extremity edema bilaterally, pulses symmetric and intact bilaterally. Skin: Warm, dry and intact. No rashes or erythema. Psychiatric: Normal mood and affect. speech and behavior is normal. Cognition and memory are normal.      Assessment & Plan:   Please see problem oriented assessment and plan.

## 2015-06-13 NOTE — Assessment & Plan Note (Signed)
Patient was evaluated by Ophthalmology for blurry vision. Patient reports her examination was normal. Opthalmology thought her symptoms were related to her anxiety.  Plan: -Zoloft 25 mg QD

## 2015-06-13 NOTE — Assessment & Plan Note (Signed)
BP Readings from Last 3 Encounters:  06/13/15 135/83  06/06/15 118/78  05/02/15 116/52    Lab Results  Component Value Date   NA 139 03/07/2015   K 4.2 03/07/2015   CREATININE 0.80 03/07/2015    Assessment: Blood pressure control:  Controlled Progress toward BP goal:   At goal Comments: On triamterene-HCTZ 37.5-25 mg daily  Plan: Medications:  continue current medications

## 2015-06-13 NOTE — Assessment & Plan Note (Addendum)
Patient does have history of anxiety, but has has opted to try conservative measures as she no longer wished to be on Zoloft. Given her multiple medical issues caused or worsened by her anxiety, patient is agreeable to starting Zoloft again.   Plan: -Zoloft 25 mg QD

## 2015-06-13 NOTE — Assessment & Plan Note (Addendum)
Patient was seen by Cardiology after symptoms of palpitations, tachycardia, and EKG revealed ectopic atrial tachycardia. Holter monitor was normal. She does have a sinus arrhythmia which could be causing her symptoms. TSH normal. Cardiology wonders if her symptoms are being caused by anxiety. Patient could come off of diltiazem since holter monitor was normal, but patient wanted to continue it. Patient reports she has no longer been taking it. She is interested in starting something for anxiety.  Plan: -D/C Diltiazem -Start Zoloft 25 mg QD -Follow up with Cardiology prn

## 2015-06-13 NOTE — Patient Instructions (Signed)
TAKE ZOLOFT 25 MG ONCE A DAY FOR ANXIETY.  FOLLOW UP WITH YOUR ENT AND AUDIOLOGIST FOR YOUR MENIERE'S DISEASE.  FOLLOW UP 6 MONTHS.

## 2015-06-13 NOTE — Assessment & Plan Note (Signed)
Pelvic exam with pap smear, wet prep and GC/Chlamydia probe completed today.

## 2015-06-14 ENCOUNTER — Telehealth: Payer: Self-pay | Admitting: Internal Medicine

## 2015-06-14 LAB — CBC
HEMATOCRIT: 32.6 % — AB (ref 34.0–46.6)
Hemoglobin: 9.7 g/dL — ABNORMAL LOW (ref 11.1–15.9)
MCH: 19.9 pg — ABNORMAL LOW (ref 26.6–33.0)
MCHC: 29.8 g/dL — ABNORMAL LOW (ref 31.5–35.7)
MCV: 67 fL — ABNORMAL LOW (ref 79–97)
Platelets: 425 10*3/uL — ABNORMAL HIGH (ref 150–379)
RBC: 4.88 x10E6/uL (ref 3.77–5.28)
RDW: 15.8 % — AB (ref 12.3–15.4)
WBC: 6.6 10*3/uL (ref 3.4–10.8)

## 2015-06-14 LAB — CERVICOVAGINAL ANCILLARY ONLY
CHLAMYDIA, DNA PROBE: NEGATIVE
Neisseria Gonorrhea: NEGATIVE

## 2015-06-14 LAB — BMP8+ANION GAP
Anion Gap: 20 mmol/L — ABNORMAL HIGH (ref 10.0–18.0)
BUN/Creatinine Ratio: 18 (ref 8–20)
BUN: 11 mg/dL (ref 6–20)
CHLORIDE: 98 mmol/L (ref 97–108)
CO2: 21 mmol/L (ref 18–29)
CREATININE: 0.62 mg/dL (ref 0.57–1.00)
Calcium: 9.6 mg/dL (ref 8.7–10.2)
GFR calc Af Amer: 143 mL/min/{1.73_m2} (ref >59)
GFR calc non Af Amer: 124 mL/min/{1.73_m2} (ref >59)
GLUCOSE: 79 mg/dL (ref 65–99)
Potassium: 4.2 mmol/L (ref 3.5–5.2)
SODIUM: 139 mmol/L (ref 134–144)

## 2015-06-14 LAB — SPECIMEN STATUS REPORT

## 2015-06-14 MED ORDER — FERROUS SULFATE 325 (65 FE) MG PO TABS: 325.0000 mg | ORAL_TABLET | Freq: Three times a day (TID) | ORAL | Status: DC

## 2015-06-14 MED ORDER — POLYETHYLENE GLYCOL 3350 17 GM/SCOOP PO POWD: 17.0000 g | Freq: Every day | ORAL | Status: DC | PRN

## 2015-06-14 NOTE — Telephone Encounter (Signed)
Called patient given hemoglobin drop from 11.7 to 9.7. Likely secondary to iron deficiency anemia from menorrhagia. Please see my note from 06/13/15 with assessment and plan.

## 2015-06-14 NOTE — Addendum Note (Signed)
Addended by: Vickii Chafe on: 06/14/2015 12:28 PM   Modules accepted: Orders

## 2015-06-14 NOTE — Addendum Note (Signed)
Addended by: Vickii Chafe on: 06/14/2015 12:05 PM   Modules accepted: Orders

## 2015-06-15 LAB — CERVICOVAGINAL ANCILLARY ONLY: Wet Prep (BD Affirm): POSITIVE — AB

## 2015-06-15 LAB — CYTOLOGY - PAP

## 2015-06-15 LAB — FERRITIN: Ferritin: 14 ng/mL — ABNORMAL LOW (ref 15–150)

## 2015-06-15 LAB — IRON: Iron: 21 ug/dL — ABNORMAL LOW (ref 27–159)

## 2015-06-18 ENCOUNTER — Other Ambulatory Visit: Payer: Self-pay | Admitting: Internal Medicine

## 2015-06-18 DIAGNOSIS — B9689 Other specified bacterial agents as the cause of diseases classified elsewhere: Secondary | ICD-10-CM

## 2015-06-18 DIAGNOSIS — N760 Acute vaginitis: Secondary | ICD-10-CM

## 2015-06-18 MED ORDER — METRONIDAZOLE 500 MG PO TABS: 500.0000 mg | ORAL_TABLET | Freq: Two times a day (BID) | ORAL | Status: DC

## 2015-06-27 ENCOUNTER — Ambulatory Visit: Payer: No Typology Code available for payment source | Attending: Internal Medicine | Admitting: Audiology

## 2015-07-18 ENCOUNTER — Ambulatory Visit (INDEPENDENT_AMBULATORY_CARE_PROVIDER_SITE_OTHER): Payer: Self-pay | Admitting: Internal Medicine

## 2015-07-18 VITALS — BP 137/77 | HR 103 | Temp 97.4°F | Ht 63.0 in | Wt 212.2 lb

## 2015-07-18 DIAGNOSIS — F411 Generalized anxiety disorder: Secondary | ICD-10-CM

## 2015-07-18 DIAGNOSIS — I10 Essential (primary) hypertension: Secondary | ICD-10-CM

## 2015-07-18 DIAGNOSIS — H8109 Meniere's disease, unspecified ear: Secondary | ICD-10-CM

## 2015-07-18 MED ORDER — HYDROCHLOROTHIAZIDE 25 MG PO TABS: 25.0000 mg | ORAL_TABLET | Freq: Every day | ORAL | Status: DC

## 2015-07-18 MED ORDER — POTASSIUM CHLORIDE CRYS ER 20 MEQ PO TBCR: 20.0000 meq | EXTENDED_RELEASE_TABLET | Freq: Every day | ORAL | Status: DC

## 2015-07-18 MED ORDER — LORAZEPAM 1 MG PO TABS: 1.0000 mg | ORAL_TABLET | Freq: Every day | ORAL | Status: DC | PRN

## 2015-07-18 NOTE — Progress Notes (Signed)
Subjective:    [name], female    DOB: [birth year], 29 y.o.   MRN: [id number]  HPI Alyssa Hall is a 29 y.o. female with PMHx of Meniere's Disease, Hypothyroidism who presents to the clinic for follow up for her Meniere's Disease. Please see A&P for the status of the patient's chronic medical problems.   Past Medical History  Diagnosis Date  . Thyrotoxicosis   . Atrial tachycardia ([place])   . Dizziness   . Hypothyroid   . Depression   . Anxiety   . Meniere's disease   . Anemia   . Fibroids   . Hypertension     Outpatient Encounter Prescriptions as of 07/18/2015  Medication Sig  . acetaminophen (TYLENOL) 500 MG tablet Take 1,000 mg by mouth every 6 (six) hours as needed for mild pain or moderate pain.  . ferrous sulfate 325 (65 FE) MG tablet Take 1 tablet (325 mg total) by mouth 3 (three) times daily with meals.  . hydrochlorothiazide (HYDRODIURIL) 25 MG tablet Take 1 tablet (25 mg total) by mouth daily.  Marland Kitchen levothyroxine (SYNTHROID, LEVOTHROID) 25 MCG tablet Take 1 tablet (25 mcg total) by mouth daily before breakfast.  . LORazepam (ATIVAN) 1 MG tablet Take 1 tablet (1 mg total) by mouth daily as needed for anxiety.  . metroNIDAZOLE (FLAGYL) 500 MG tablet Take 1 tablet (500 mg total) by mouth 2 (two) times daily.  . polyethylene glycol powder (GLYCOLAX/MIRALAX) powder Take 17 g by mouth daily as needed for moderate constipation.  . potassium chloride SA (K-DUR,KLOR-CON) 20 MEQ tablet Take 1 tablet (20 mEq total) by mouth daily.  . sertraline (ZOLOFT) 25 MG tablet Take 1 tablet (25 mg total) by mouth daily.  . [DISCONTINUED] triamterene-hydrochlorothiazide (DYAZIDE) 37.5-25 MG capsule Take 1 capsule by mouth daily.   No facility-administered encounter medications on file as of 07/18/2015.    Family History  Problem Relation Age of Onset  . Heart failure Maternal Aunt     Social History   Social History  . Marital Status: Single    Spouse Name: N/A  .  Number of Children: N/A  . Years of Education: N/A   Occupational History  . Not on file.   Social History Main Topics  . Smoking status: Never Smoker   . Smokeless tobacco: Never Used  . Alcohol Use: No  . Drug Use: No  . Sexual Activity: Not Currently    Birth Control/ Protection: None   Other Topics Concern  . Not on file   Social History Narrative   Uninsured.   Lives in [place].          Review of Systems General: Denies fever, chills, fatigue Respiratory: Denies SOB, cough Cardiovascular: Denies chest pain and palpitations.  Neurological: Admits to intermittent dizziness, lightheadedness, vertigo. Denies headaches, weakness,  and syncope, Psychiatric/Behavioral: Admits to anxiety and panic attacks. Denies depression.     Objective:   Physical Exam Filed Vitals:   07/18/15 1455  BP: 137/77  Pulse: 103  Temp: 97.4 F (36.3 C)  TempSrc: Oral  Height: 5\' 3"  (1.6 m)  Weight: 212 lb 3.2 oz (96.253 kg)  SpO2: 100%   General: Vital signs reviewed.  Patient is well-developed and well-nourished, in no acute distress and cooperative with exam.  Cardiovascular: Tachycardic, regular rhythm, S1 normal, S2 normal, no murmurs, gallops, or rubs. Pulmonary/Chest: Clear to auscultation bilaterally, no wheezes, rales, or rhonchi. Extremities: No lower extremity edema bilaterally Psychiatric: Pleasant, but tearful at times. Anxious.  Assessment & Plan:   Please see problem based assessment and plan.

## 2015-07-18 NOTE — Patient Instructions (Signed)
FOR YOUR ANXIETY: -TAKE ZOLOFT 25 MG EVERY DAY -TAKE ATIVAN 1 MG ONCE WHEN YOU HAVE THE FEELINGS OF PANIC COMING ON. ONLY USE IT WHEN YOU NEED IT, NO MORE THAN 3 TIMES PER DAY.  FOR YOUR MENIERE'S DISEASE: -TAKE HYDROCHLOROTHIAZIDE 25 MG PER DAY AND POTASSIUM 20 MEQ ONCE A DAY -WE WILL HELP SET UP YOUR AUDIOLOGY APPOINTMENT

## 2015-07-18 NOTE — Progress Notes (Signed)
Medicine attending: Medical history, presenting problems, physical findings, and medications, reviewed with Dr Alexa Richardson and I concur with her evaluation and management plan. 

## 2015-07-18 NOTE — Assessment & Plan Note (Signed)
BP Readings from Last 3 Encounters:  07/18/15 137/77  06/13/15 135/83  06/06/15 118/78    Lab Results  Component Value Date   NA 139 06/13/2015   K 4.2 06/13/2015   CREATININE 0.62 06/13/2015    Assessment: Blood pressure control:  Controlled Progress toward BP goal:   At goal. Comments: Starting HCTZ for Menire's disease  Plan: Medications:  HCTZ 25 mg daily Educational resources provided:   Self management tools provided:   Other plans: Follow up one month for BMET

## 2015-07-18 NOTE — Assessment & Plan Note (Addendum)
Patient has a history of Meniere's Disease which was diagnosed after evaluation by Vestibular Rehab and by ENT. At the last visit, patient was compliant with her triamterene-HCTZ and she was referred to Audiology. Audiology told the patient they do not accept the orange card and then she was unable to get in to see her ENT again. She has not been taking her triamterene-HCTZ d/t fatigue. Vertigo has returned.   Plan: -Restarted HCTZ 25 mg daily with KCl 20 mEq daily -Stop triamterene due to fatigue -Benzodiazepine will also help with Meniere's as it suppresses the central vestibular response -We will work on her Audiology referral -Follow up one month

## 2015-07-18 NOTE — Assessment & Plan Note (Signed)
Patient's generalized anxiety continues to be uncontrolled but she has not been taking her Zoloft either. She also admits to feelings of doom and panic episodes where she feels like she might die. While questioning her about these things, she becomes tearful and overwhelmed. She is open to trying medication and counseling.  Plan: -Zoloft 25 mg daily -Ativan 1 mg QD prn (switch to Xanax at next visit as this is first line) -Referral to [facility] for counseling -Follow up one month

## 2015-07-19 ENCOUNTER — Ambulatory Visit: Payer: No Typology Code available for payment source | Admitting: Cardiovascular Disease

## 2015-07-19 DIAGNOSIS — R0989 Other specified symptoms and signs involving the circulatory and respiratory systems: Secondary | ICD-10-CM

## 2015-07-25 ENCOUNTER — Ambulatory Visit: Payer: No Typology Code available for payment source

## 2015-08-03 ENCOUNTER — Emergency Department (HOSPITAL_COMMUNITY)
Admission: EM | Admit: 2015-08-03 | Discharge: 2015-08-03 | Discharge status: Against Medical Advice | Payer: Self-pay | Attending: Emergency Medicine | Admitting: Emergency Medicine

## 2015-08-03 ENCOUNTER — Encounter (HOSPITAL_COMMUNITY): Payer: Self-pay | Admitting: Emergency Medicine

## 2015-08-03 DIAGNOSIS — R42 Dizziness and giddiness: Secondary | ICD-10-CM | POA: Insufficient documentation

## 2015-08-03 DIAGNOSIS — R110 Nausea: Secondary | ICD-10-CM | POA: Insufficient documentation

## 2015-08-03 DIAGNOSIS — F419 Anxiety disorder, unspecified: Secondary | ICD-10-CM | POA: Insufficient documentation

## 2015-08-03 DIAGNOSIS — I10 Essential (primary) hypertension: Secondary | ICD-10-CM | POA: Insufficient documentation

## 2015-08-03 DIAGNOSIS — R200 Anesthesia of skin: Secondary | ICD-10-CM | POA: Insufficient documentation

## 2015-08-03 LAB — BASIC METABOLIC PANEL
ANION GAP: 8 (ref 5–15)
BUN: 14 mg/dL (ref 6–20)
CHLORIDE: 105 mmol/L (ref 101–111)
CO2: 24 mmol/L (ref 22–32)
Calcium: 9.2 mg/dL (ref 8.9–10.3)
Creatinine, Ser: 0.82 mg/dL (ref 0.44–1.00)
GFR calc Af Amer: >60 mL/min (ref >60)
GFR calc non Af Amer: >60 mL/min (ref >60)
GLUCOSE: 81 mg/dL (ref 65–99)
POTASSIUM: 3.7 mmol/L (ref 3.5–5.1)
Sodium: 137 mmol/L (ref 135–145)

## 2015-08-03 LAB — CBC
HCT: 36 % (ref 36.0–46.0)
HEMOGLOBIN: 10.9 g/dL — AB (ref 12.0–15.0)
MCH: 22 pg — AB (ref 26.0–34.0)
MCHC: 30.3 g/dL (ref 30.0–36.0)
MCV: 72.6 fL — AB (ref 78.0–100.0)
Platelets: 418 10*3/uL — ABNORMAL HIGH (ref 150–400)
RBC: 4.96 MIL/uL (ref 3.87–5.11)
RDW: 19.6 % — ABNORMAL HIGH (ref 11.5–15.5)
WBC: 7 10*3/uL (ref 4.0–10.5)

## 2015-08-03 LAB — URINALYSIS, ROUTINE W REFLEX MICROSCOPIC
BILIRUBIN URINE: NEGATIVE
GLUCOSE, UA: NEGATIVE mg/dL
Hgb urine dipstick: NEGATIVE
KETONES UR: NEGATIVE mg/dL
Leukocytes, UA: NEGATIVE
Nitrite: NEGATIVE
Protein, ur: NEGATIVE mg/dL
Specific Gravity, Urine: 1.012 (ref 1.005–1.030)
pH: 5.5 (ref 5.0–8.0)

## 2015-08-03 NOTE — ED Notes (Signed)
Per EMS, pt stopped car in the middle of the road.  Pt was headed home.  Pt was c/o dizziness with numbness/tingling to hand.  Pt with nausea and dry heaves.  Pt has hx of same with anxiety.  Initial htn noted.  Pt alert and oriented.  WNL neuro.  Vitals:  160/94, hr 96, cbg 113, resp 20, 99% ra

## 2015-08-03 NOTE — ED Notes (Signed)
Patient called x 3 times. No answer  - patient removed from system

## 2015-08-08 ENCOUNTER — Ambulatory Visit: Payer: Self-pay

## 2015-08-14 ENCOUNTER — Telehealth: Payer: Self-pay

## 2015-08-16 ENCOUNTER — Ambulatory Visit (INDEPENDENT_AMBULATORY_CARE_PROVIDER_SITE_OTHER): Payer: Self-pay | Admitting: Internal Medicine

## 2015-08-16 ENCOUNTER — Encounter: Payer: Self-pay | Admitting: Internal Medicine

## 2015-08-16 VITALS — BP 113/69 | HR 100 | Temp 98.2°F | Wt 210.6 lb

## 2015-08-16 DIAGNOSIS — F411 Generalized anxiety disorder: Secondary | ICD-10-CM

## 2015-08-16 DIAGNOSIS — R102 Pelvic and perineal pain: Secondary | ICD-10-CM

## 2015-08-16 DIAGNOSIS — N760 Acute vaginitis: Secondary | ICD-10-CM | POA: Insufficient documentation

## 2015-08-16 DIAGNOSIS — B9689 Other specified bacterial agents as the cause of diseases classified elsewhere: Secondary | ICD-10-CM | POA: Insufficient documentation

## 2015-08-16 DIAGNOSIS — H8109 Meniere's disease, unspecified ear: Secondary | ICD-10-CM

## 2015-08-16 LAB — POCT URINALYSIS DIPSTICK
Bilirubin, UA: NEGATIVE
Blood, UA: NEGATIVE
GLUCOSE UA: NEGATIVE
Ketones, UA: NEGATIVE
LEUKOCYTES UA: NEGATIVE
NITRITE UA: NEGATIVE
Protein, UA: NEGATIVE
Spec Grav, UA: 1.025
UROBILINOGEN UA: 0.2
pH, UA: 5

## 2015-08-16 MED ORDER — CEFTRIAXONE SODIUM 250 MG IJ SOLR: 250.0000 mg | Freq: Once | INTRAMUSCULAR | Status: DC

## 2015-08-16 MED ORDER — METRONIDAZOLE 500 MG PO TABS: 500.0000 mg | ORAL_TABLET | Freq: Two times a day (BID) | ORAL | Status: AC

## 2015-08-16 MED ORDER — CEFTRIAXONE SODIUM 250 MG IJ SOLR
250.0000 mg | Freq: Once | INTRAMUSCULAR | Status: AC
  Administered 2015-08-16: 250 mg via INTRAMUSCULAR

## 2015-08-16 MED ORDER — DOXYCYCLINE HYCLATE 100 MG PO CAPS: 100.0000 mg | ORAL_CAPSULE | Freq: Two times a day (BID) | ORAL | Status: DC

## 2015-08-16 NOTE — Patient Instructions (Signed)
Today we check chief for a urinary tract infection with a urine test that was negative. Based on this we did a pelvic exam which did show cervical tenderness so you're being prescribed antibiotics to treat for a pelvic inflammatory disease. This can be caused by sexual transmitted infections but also by untreated normal bacterial overgrowth of the vagina.  You got a shot of Rocephin today which treats the most common cause of bacteria, but were also prescribed a 14 day course of metronidazole 500 mg twice daily. is very important to complete the entire antibiotic course to make sure no residual bacteria cause your symptoms to come back.   Your symptoms should quickly improved with treatment, however if they continue to worsen or you develops new symptoms such as fevers, chills, or severe worsening pain please call us to be reevaluated.  We checked her blood work for a potassium level today and will let you know if any changes are needed to your current medicines.

## 2015-08-16 NOTE — Progress Notes (Signed)
Subjective:   [name]   DOB: 1986-01-04 29 y.o.   MRN: [id number]  HPI: [name] is a 29 y.o.  woman with past medical history as described below presenting for an approximately five-day history of abdominal pain.  This pain is centered in her lower abdomen and is exacerbated with bumps when driving or riding and urination.she has dysuria and increased urinary frequency but has not noticed any blood, discoloration, or strong odor to her urine. She has not had any fever or chills. She has not had any flank pain or tenderness. She has not noticed any vaginal discharge. She has not had any nausea, diarrhea, or constipation.    see problem based assessment and plan below for additional details.  Past Medical History  Diagnosis Date  . Thyrotoxicosis   . Atrial tachycardia ([place])   . Dizziness   . Hypothyroid   . Depression   . Anxiety   . Meniere's disease   . Anemia   . Fibroids   . Hypertension    Current Outpatient Prescriptions  Medication Sig Dispense Refill  . acetaminophen (TYLENOL) 500 MG tablet Take 1,000 mg by mouth every 6 (six) hours as needed for mild pain or moderate pain.    . ferrous sulfate 325 (65 FE) MG tablet Take 1 tablet (325 mg total) by mouth 3 (three) times daily with meals. 90 tablet 3  . hydrochlorothiazide (HYDRODIURIL) 25 MG tablet Take 1 tablet (25 mg total) by mouth daily. 30 tablet 11  . levothyroxine (SYNTHROID, LEVOTHROID) 25 MCG tablet Take 1 tablet (25 mcg total) by mouth daily before breakfast. 30 tablet 11  . LORazepam (ATIVAN) 1 MG tablet Take 1 tablet (1 mg total) by mouth daily as needed for anxiety. 12 tablet 0  . metroNIDAZOLE (FLAGYL) 500 MG tablet Take 1 tablet (500 mg total) by mouth 2 (two) times daily. (Patient not taking: Reported on 08/03/2015) 14 tablet 0  . polyethylene glycol powder (GLYCOLAX/MIRALAX) powder Take 17 g by mouth daily as needed for moderate constipation. (Patient not taking: Reported on  08/03/2015) 255 g 0  . potassium chloride SA (K-DUR,KLOR-CON) 20 MEQ tablet Take 1 tablet (20 mEq total) by mouth daily. 30 tablet 3  . sertraline (ZOLOFT) 25 MG tablet Take 1 tablet (25 mg total) by mouth daily. (Patient not taking: Reported on 08/03/2015) 30 tablet 2   No current facility-administered medications for this visit.   Family History  Problem Relation Age of Onset  . Heart failure Maternal Aunt    Social History   Social History  . Marital Status: Single    Spouse Name: N/A  . Number of Children: N/A  . Years of Education: N/A   Social History Main Topics  . Smoking status: Never Smoker   . Smokeless tobacco: Never Used  . Alcohol Use: No  . Drug Use: No  . Sexual Activity: Not Currently    Birth Control/ Protection: None   Other Topics Concern  . None   Social History Narrative   Uninsured.   Lives in [place].         Review of Systems: Review of Systems  Constitutional: Negative for fever and chills.  Respiratory: Negative for shortness of breath.   Gastrointestinal: Positive for abdominal pain. Negative for nausea, diarrhea, constipation and blood in stool.  Genitourinary: Positive for dysuria and frequency. Negative for hematuria and flank pain.  Endo/Heme/Allergies: Negative for polydipsia.  Psychiatric/Behavioral: The patient is nervous/anxious.  Objective:  Physical Exam: Filed Vitals:   08/16/15 1341  BP: 113/69  Pulse: 100  Temp: 98.2 F (36.8 C)  TempSrc: Oral  Weight: 210 lb 9.6 oz (95.528 kg)  SpO2: 100%   GENERAL- alert, co-operative, NAD HEENT- Atraumatic, oral mucosa appears moist, good and intact dentition CARDIAC- RRR, no murmurs, rubs or gallops. RESP- CTAB, no wheezes or crackles. ABDOMEN- Soft, moderate suprapubic and left lower quadrant tenderness to palpation BACK- Normal curvature, no paraspinal tenderness, no CVA tenderness. EXTREMITIES- pulse 2+, symmetric, no pedal edema. SKIN- Warm, dry, No rash or  lesion. PSYCH- Normal mood and affect, appropriate thought content and speech.  GENITOURINARY-external structures within normal limits, no unusual discharge or odor noted, no erythema on visual inspection of the vagina, cervical motion tenderness was present, no abnormal pelvic masses appreciated  Assessment & Plan:

## 2015-08-17 LAB — BMP8+ANION GAP
Anion Gap: 20 mmol/L — ABNORMAL HIGH (ref 10.0–18.0)
BUN/Creatinine Ratio: 15 (ref 8–20)
BUN: 11 mg/dL (ref 6–20)
CALCIUM: 10.2 mg/dL (ref 8.7–10.2)
CHLORIDE: 97 mmol/L (ref 97–106)
CO2: 24 mmol/L (ref 18–29)
Creatinine, Ser: 0.74 mg/dL (ref 0.57–1.00)
GFR calc Af Amer: 127 mL/min/{1.73_m2} (ref >59)
GFR calc non Af Amer: 111 mL/min/{1.73_m2} (ref >59)
GLUCOSE: 78 mg/dL (ref 65–99)
Potassium: 4.5 mmol/L (ref 3.5–5.2)
Sodium: 141 mmol/L (ref 136–144)

## 2015-08-17 LAB — CERVICOVAGINAL ANCILLARY ONLY
Chlamydia: NEGATIVE
Neisseria Gonorrhea: NEGATIVE

## 2015-08-17 NOTE — Assessment & Plan Note (Signed)
Patient was started on HCTZ for her Mnire's disease by ENT previously but has been lost to follow up with them due to now on [facility] card for visit coverage. She was restarted on her hard core thiazide with potassium supplementation one month ago. Blood pressure has been at goal without significant episodes of lightheadedness and she feels her Mnire's disease is moderately well-controlled without frequent vertigo or any falls.  Continue HCTZ 25 mg with KCl 20 mEq Check potassium today and will call patient if dose adjustment required

## 2015-08-17 NOTE — Assessment & Plan Note (Addendum)
Assessment: Patient presenting with acute onset of lower abdominal pain with history consistent for urinary tract infection including dysuria and urinary frequency. She denies any systemic symptoms during this time. She denies diarrhea or constipation or blood in her stools. Timing of symptoms is not related to her menstrual period. However during the visit urine dip study was entirely benign. Pelvic exam with swab for GC chlamydia and wet prep was then performed. No odor, discharge, or erythema was visualized however patient did endorse tenderness particularly to anterior and lateral motion of the cervix. No suprapubic masses were palpated, there was a mild firmness in the left lower quadrant most likely consistent with stool.  Of note she was recently tested with routine Pap smear 2 months ago which was only positive for Gardnerella bacterial vaginosis. She was prescribed metronidazole which she reports not taking as directed by her physician. She was also previously evaluated in August 2015 for questionable pelvic inflammatory disease after her boyfriend had tested positive for chlamydia. At that time she tested negative for chlamydia by urine cytology. She is sexually active with only 1 partner and does not use barrier protection.  Plan: Swab for GC chlamydia and wet prep Administered ceftriaxone 250 mg IM during office visit today Prescribed metronidazole 500 mg twice a day for 14 days, but will call the patient and change this for coverage if chlamydia test from swab returns positive

## 2015-08-17 NOTE — Assessment & Plan Note (Signed)
Patient reports her anxiety is currently well-controlled without significant panic attacks and heart palpitations or chest pain during the past month. This is improved compared to her last visit on the current regimen. She is currently taking Zoloft 25 mg daily but is also using benzodiazepines daily as needed which she reports is most days.  Plan: Continue Zoloft 25 mg and Ativan 1 mg daily as needed We'll defer anti-anxiety medication changes to her PCP

## 2015-08-20 LAB — CERVICOVAGINAL ANCILLARY ONLY: WET PREP (BD AFFIRM): POSITIVE — AB

## 2015-08-27 ENCOUNTER — Emergency Department (HOSPITAL_COMMUNITY)
Admission: EM | Admit: 2015-08-27 | Discharge: 2015-08-27 | Disposition: A | Discharge status: Home / Self Care | Payer: Self-pay | Attending: Emergency Medicine | Admitting: Emergency Medicine

## 2015-08-27 ENCOUNTER — Encounter (HOSPITAL_COMMUNITY): Payer: Self-pay | Admitting: Cardiology

## 2015-08-27 DIAGNOSIS — E0590 Thyrotoxicosis, unspecified without thyrotoxic crisis or storm: Secondary | ICD-10-CM | POA: Insufficient documentation

## 2015-08-27 DIAGNOSIS — F419 Anxiety disorder, unspecified: Secondary | ICD-10-CM | POA: Insufficient documentation

## 2015-08-27 DIAGNOSIS — Z79899 Other long term (current) drug therapy: Secondary | ICD-10-CM | POA: Insufficient documentation

## 2015-08-27 DIAGNOSIS — S161XXA Strain of muscle, fascia and tendon at neck level, initial encounter: Secondary | ICD-10-CM | POA: Insufficient documentation

## 2015-08-27 DIAGNOSIS — Z86018 Personal history of other benign neoplasm: Secondary | ICD-10-CM | POA: Insufficient documentation

## 2015-08-27 DIAGNOSIS — X58XXXA Exposure to other specified factors, initial encounter: Secondary | ICD-10-CM | POA: Insufficient documentation

## 2015-08-27 DIAGNOSIS — R42 Dizziness and giddiness: Secondary | ICD-10-CM | POA: Insufficient documentation

## 2015-08-27 DIAGNOSIS — D649 Anemia, unspecified: Secondary | ICD-10-CM | POA: Insufficient documentation

## 2015-08-27 DIAGNOSIS — I10 Essential (primary) hypertension: Secondary | ICD-10-CM | POA: Insufficient documentation

## 2015-08-27 DIAGNOSIS — Y999 Unspecified external cause status: Secondary | ICD-10-CM | POA: Insufficient documentation

## 2015-08-27 DIAGNOSIS — Z792 Long term (current) use of antibiotics: Secondary | ICD-10-CM | POA: Insufficient documentation

## 2015-08-27 DIAGNOSIS — E039 Hypothyroidism, unspecified: Secondary | ICD-10-CM | POA: Insufficient documentation

## 2015-08-27 DIAGNOSIS — F329 Major depressive disorder, single episode, unspecified: Secondary | ICD-10-CM | POA: Insufficient documentation

## 2015-08-27 DIAGNOSIS — J069 Acute upper respiratory infection, unspecified: Secondary | ICD-10-CM | POA: Insufficient documentation

## 2015-08-27 DIAGNOSIS — Y939 Activity, unspecified: Secondary | ICD-10-CM | POA: Insufficient documentation

## 2015-08-27 DIAGNOSIS — Y929 Unspecified place or not applicable: Secondary | ICD-10-CM | POA: Insufficient documentation

## 2015-08-27 DIAGNOSIS — H9202 Otalgia, left ear: Secondary | ICD-10-CM | POA: Insufficient documentation

## 2015-08-27 MED ORDER — NAPROXEN 500 MG PO TABS: 500.0000 mg | ORAL_TABLET | Freq: Two times a day (BID) | ORAL | Status: DC

## 2015-08-27 NOTE — Progress Notes (Signed)
I saw and evaluated the patient.  I personally confirmed the key portions of Dr. Rice's history and exam and reviewed pertinent patient test results.  The assessment, diagnosis, and plan were formulated together and I agree with the documentation in the resident's note. 

## 2015-08-27 NOTE — Addendum Note (Signed)
Addended by: Oval Linsey D on: 08/27/2015 02:22 PM   Modules accepted: Level of Service

## 2015-08-27 NOTE — Discharge Instructions (Signed)
1. Medications: Naproxen for neck pain and inflammation, continue usual home medications 2. Treatment: rest, ice affected area (directions below), increase hydration 3. Follow Up: Please follow up with your primary doctor in 3 days for discussion of your diagnoses and further evaluation after today's visit; Return to the ER for high fevers, difficulty breathing or other concerning symptoms   COLD THERAPY DIRECTIONS:  Ice or gel packs can be used to reduce both pain and swelling. Ice is the most helpful within the first 24 to 48 hours after an injury or flareup from overusing a muscle or joint.  Ice is effective, has very few side effects, and is safe for most people to use.   If you expose your skin to cold temperatures for too long or without the proper protection, you can damage your skin or nerves. Watch for signs of skin damage due to cold.   HOME CARE INSTRUCTIONS  Follow these tips to use ice and cold packs safely.  Place a dry or damp towel between the ice and skin. A damp towel will cool the skin more quickly, so you may need to shorten the time that the ice is used.  For a more rapid response, add gentle compression to the ice.  Ice for no more than 10 to 20 minutes at a time. The bonier the area you are icing, the less time it will take to get the benefits of ice.  Check your skin after 5 minutes to make sure there are no signs of a poor response to cold or skin damage.  Rest 20 minutes or more in between uses.  Once your skin is numb, you can end your treatment. You can test numbness by very lightly touching your skin. The touch should be so light that you do not see the skin dimple from the pressure of your fingertip. When using ice, most people will feel these normal sensations in this order: cold, burning, aching, and numbness.  Do not use ice on someone who cannot communicate their responses to pain, such as small children or people with dementia.

## 2015-08-27 NOTE — ED Provider Notes (Signed)
CSN: [id number]     Arrival date & time 08/27/15  1136 History  By signing my name below, I, Stephania Fragmin, attest that this documentation has been prepared under the direction and in the presence of [facility], PA-C. Electronically Signed: Stephania Fragmin, ED Scribe. 08/27/2015. 1:43 PM.    Chief Complaint  Patient presents with  . Otalgia  . Cough  . Nasal Congestion   The history is provided by the patient. No language interpreter was used.    HPI Comments: Alyssa Hall is a 29 y.o. female with a history of Meniere's disease, dizziness, anemia, thyrotoxicosis, hypertension, depression, and anxiety, who presents to the Emergency Department complaining of constant, moderate left otalgia radiating down her neck to her left arm, with onset 3 days ago. She also complains of associated dizziness.She reports her symptoms started out with cold-like symptoms that started 1 week ago. She denies any headache.  Past Medical History  Diagnosis Date  . Thyrotoxicosis   . Atrial tachycardia ([place])   . Dizziness   . Hypothyroid   . Depression   . Anxiety   . Meniere's disease   . Anemia   . Fibroids   . Hypertension    History reviewed. No pertinent past surgical history. Family History  Problem Relation Age of Onset  . Heart failure Maternal Aunt    Social History  Substance Use Topics  . Smoking status: Never Smoker   . Smokeless tobacco: Never Used  . Alcohol Use: No   OB History    Gravida Para Term Preterm AB TAB SAB Ectopic Multiple Living   1    1 1          Review of Systems  Constitutional: Negative.   HENT: Positive for ear pain.   Respiratory: Positive for cough.   Neurological: Positive for dizziness. Negative for headaches.    Allergies  Review of patient's allergies indicates no known allergies.  Home Medications   Prior to Admission medications   Medication Sig Start Date End Date Taking? Authorizing Provider  acetaminophen (TYLENOL) 500 MG tablet Take 1,000 mg by  mouth every 6 (six) hours as needed for mild pain or moderate pain.    Historical Provider, MD  cefTRIAXone (ROCEPHIN) 250 MG injection Inject 250 mg into the muscle once.  FOR IM use in LARGE MUSCLE MASS 08/16/15   Collier Salina, MD  ferrous sulfate 325 (65 FE) MG tablet Take 1 tablet (325 mg total) by mouth 3 (three) times daily with meals. 06/14/15   Alexa Sherral Hammers, MD  hydrochlorothiazide (HYDRODIURIL) 25 MG tablet Take 1 tablet (25 mg total) by mouth daily. 07/18/15   Alexa Sherral Hammers, MD  levothyroxine (SYNTHROID, LEVOTHROID) 25 MCG tablet Take 1 tablet (25 mcg total) by mouth daily before breakfast. 03/15/15   Alexa Sherral Hammers, MD  LORazepam (ATIVAN) 1 MG tablet Take 1 tablet (1 mg total) by mouth daily as needed for anxiety. 07/18/15   Alexa Sherral Hammers, MD  metroNIDAZOLE (FLAGYL) 500 MG tablet Take 1 tablet (500 mg total) by mouth 2 (two) times daily. 08/16/15 08/29/15  Collier Salina, MD  naproxen (NAPROSYN) 500 MG tablet Take 1 tablet (500 mg total) by mouth 2 (two) times daily. 08/27/15   Ozella Almond Ginger Leeth, PA-C  polyethylene glycol powder (GLYCOLAX/MIRALAX) powder Take 17 g by mouth daily as needed for moderate constipation. Patient not taking: Reported on 08/03/2015 06/14/15   Alexa Sherral Hammers, MD  potassium chloride SA (K-DUR,KLOR-CON) 20 MEQ tablet Take 1  tablet (20 mEq total) by mouth daily. 07/18/15   Alexa Sherral Hammers, MD  sertraline (ZOLOFT) 25 MG tablet Take 1 tablet (25 mg total) by mouth daily. Patient not taking: Reported on 08/03/2015 06/13/15   Alexa Sherral Hammers, MD   BP 129/84 mmHg  Pulse 92  Temp(Src) 97.9 F (36.6 C) (Oral)  Resp 18  SpO2 100%  LMP 08/14/2015 Physical Exam  Constitutional: She is oriented to person, place, and time. She appears well-developed and well-nourished. No distress.  HENT:  Head: Normocephalic and atraumatic.  Right Ear: Hearing, tympanic membrane, external ear and ear canal normal.  Left Ear: Hearing, tympanic membrane,  external ear and ear canal normal.  Nose: Mucosal edema and rhinorrhea present. Right sinus exhibits no maxillary sinus tenderness and no frontal sinus tenderness. Left sinus exhibits no maxillary sinus tenderness.  Mouth/Throat: Uvula is midline, oropharynx is clear and moist and mucous membranes are normal.  Eyes: Conjunctivae and EOM are normal.  Neck: Normal range of motion. Neck supple.  Cardiovascular: Normal rate, regular rhythm and normal heart sounds.  Exam reveals no gallop and no friction rub.   No murmur heard. Pulmonary/Chest: Effort normal and breath sounds normal. No respiratory distress. She has no wheezes. She has no rales. She exhibits no tenderness.  Abdominal: Soft. She exhibits no distension. There is no tenderness.  Musculoskeletal:       Back:       Arms: TTP of left neck musculature and anterior shoulder Full ROM: pain with head rotation Full ROM of left shoulder; pain with forward flexion and abduction No obvious swelling, ecchymosis, or deformities noted.   Neurological: She is alert and oriented to person, place, and time.  Skin: Skin is warm and dry.  Psychiatric: She has a normal mood and affect. Her behavior is normal.  Nursing note and vitals reviewed.   ED Course  Procedures (including critical care time)  DIAGNOSTIC STUDIES: Oxygen Saturation is 100% on RA, normal by my interpretation.    COORDINATION OF CARE: 1:43 PM - No signs of ear infection. Suspect left-sided neck strain from coughing. Discussed treatment plan with pt at bedside which includes anti-inflammatory medication and icing affected area. Advised plenty of fluids. Pt verbalized understanding and agreed to plan.   MDM   Final diagnoses:  Strain of neck muscle, initial encounter   Left ear pain likely 2/2 neck muscle strain - ttp, worse with movement. Ear exam normal.   A&P:  neck strain  -Naproxen  -Ice, rest  -Follow up URI: increase hydration, symptomatic care  I personally  performed the services described in this documentation, which was scribed in my presence. The recorded information has been reviewed and is accurate.    [facility] Ellina Sivertsen, PA-C 08/27/15 1346  Sherwood Gambler, MD 08/31/15 [phone]

## 2015-08-27 NOTE — ED Notes (Signed)
Declined W/C at D/C and was escorted to lobby by RN. 

## 2015-08-27 NOTE — ED Notes (Signed)
Pt reports a cold and left ear pain that started about 3 days ago. Has not tried any OTC medication.

## 2015-08-30 ENCOUNTER — Telehealth: Payer: Self-pay | Admitting: Internal Medicine

## 2015-08-30 DIAGNOSIS — D509 Iron deficiency anemia, unspecified: Secondary | ICD-10-CM

## 2015-08-30 NOTE — Telephone Encounter (Signed)
Pt requesting lab result. Please call back.  °

## 2015-08-30 NOTE — Telephone Encounter (Signed)
I called Alyssa Hall this afternoon and discussed her recent blood work with [place]. I informed her that her hemoglobin has increased from 9.7 to 10.9 which is trending towards a normal value. However we have not repeated iron studies since October 5. She was curious about her iron level since she's been taking 325 mg supplements daily. She is currently having a good response to this treatment with improvement in her red blood cell count and microcytosis. Reassured her and recommended that we could recheck iron studies at her next regular appointment but there was no urgent need to since her blood count was improving and she did not have ongoing symptoms of anemia today such as fatigue, dyspnea on exertion. I also informed her about her recent results for vaginal swab that demonstrated negative for GC/chlamydia but was positive for gardnerella. This was adequately treated on metronidazole that she is finishing course of tomorrow and her symptoms are improved.

## 2015-09-05 ENCOUNTER — Emergency Department (HOSPITAL_COMMUNITY)
Admission: EM | Admit: 2015-09-05 | Discharge: 2015-09-05 | Disposition: A | Discharge status: Home / Self Care | Payer: Self-pay | Attending: Emergency Medicine | Admitting: Emergency Medicine

## 2015-09-05 ENCOUNTER — Encounter (HOSPITAL_COMMUNITY): Payer: Self-pay | Admitting: *Deleted

## 2015-09-05 DIAGNOSIS — F329 Major depressive disorder, single episode, unspecified: Secondary | ICD-10-CM | POA: Insufficient documentation

## 2015-09-05 DIAGNOSIS — F419 Anxiety disorder, unspecified: Secondary | ICD-10-CM | POA: Insufficient documentation

## 2015-09-05 DIAGNOSIS — D649 Anemia, unspecified: Secondary | ICD-10-CM | POA: Insufficient documentation

## 2015-09-05 DIAGNOSIS — R05 Cough: Secondary | ICD-10-CM | POA: Insufficient documentation

## 2015-09-05 DIAGNOSIS — Z86018 Personal history of other benign neoplasm: Secondary | ICD-10-CM | POA: Insufficient documentation

## 2015-09-05 DIAGNOSIS — Z79899 Other long term (current) drug therapy: Secondary | ICD-10-CM | POA: Insufficient documentation

## 2015-09-05 DIAGNOSIS — R002 Palpitations: Secondary | ICD-10-CM | POA: Insufficient documentation

## 2015-09-05 DIAGNOSIS — E039 Hypothyroidism, unspecified: Secondary | ICD-10-CM | POA: Insufficient documentation

## 2015-09-05 DIAGNOSIS — I10 Essential (primary) hypertension: Secondary | ICD-10-CM | POA: Insufficient documentation

## 2015-09-05 NOTE — Discharge Instructions (Signed)

## 2015-09-05 NOTE — ED Notes (Signed)
Pt reports feeling like her heart was racing this morning when she woke up, reports hx of the same, reports she wore a holter monitor in October and was told by the cardiologist that everything was fine and no medications were needed. Pt denies sob or chest pain, reports hx of anxiety.

## 2015-09-05 NOTE — ED Provider Notes (Signed)
CSN: [id number]     Arrival date & time 09/05/15  0757 History   First MD Initiated Contact with Patient 09/05/15 [phone]     Chief Complaint  Patient presents with  . Tachycardia     (Consider location/radiation/quality/duration/timing/severity/associated sxs/prior Treatment) Patient is a 29 y.o. female presenting with palpitations. The history is provided by the patient.  Palpitations Palpitations quality:  Regular Onset quality:  Gradual Duration:  1 day Timing:  Constant Progression:  Unchanged Chronicity:  New Context: anxiety   Context: not caffeine, not hyperventilation and not nicotine   Relieved by:  Nothing Worsened by:  Nothing Ineffective treatments:  None tried Associated symptoms: cough (URI symptoms last 2 weeks)   Associated symptoms: no nausea, no shortness of breath and no vomiting     Past Medical History  Diagnosis Date  . Thyrotoxicosis   . Atrial tachycardia ([place])   . Dizziness   . Hypothyroid   . Depression   . Anxiety   . Meniere's disease   . Anemia   . Fibroids   . Hypertension    History reviewed. No pertinent past surgical history. Family History  Problem Relation Age of Onset  . Heart failure Maternal Aunt    Social History  Substance Use Topics  . Smoking status: Never Smoker   . Smokeless tobacco: Never Used  . Alcohol Use: No   OB History    Gravida Para Term Preterm AB TAB SAB Ectopic Multiple Living   1    1 1          Review of Systems  Respiratory: Positive for cough (URI symptoms last 2 weeks). Negative for shortness of breath.   Cardiovascular: Positive for palpitations.  Gastrointestinal: Negative for nausea and vomiting.  All other systems reviewed and are negative.     Allergies  Review of patient's allergies indicates no known allergies.  Home Medications   Prior to Admission medications   Medication Sig Start Date End Date Taking? Authorizing Provider  acetaminophen (TYLENOL) 500 MG tablet Take 1,000 mg by  mouth every 6 (six) hours as needed for mild pain or moderate pain.   Yes Historical Provider, MD  ferrous sulfate 325 (65 FE) MG tablet Take 1 tablet (325 mg total) by mouth 3 (three) times daily with meals. 06/14/15  Yes Alexa Sherral Hammers, MD  hydrochlorothiazide (HYDRODIURIL) 25 MG tablet Take 1 tablet (25 mg total) by mouth daily. 07/18/15  Yes Alexa Sherral Hammers, MD  levothyroxine (SYNTHROID, LEVOTHROID) 25 MCG tablet Take 1 tablet (25 mcg total) by mouth daily before breakfast. 03/15/15  Yes Alexa Sherral Hammers, MD  potassium chloride SA (K-DUR,KLOR-CON) 20 MEQ tablet Take 1 tablet (20 mEq total) by mouth daily. 07/18/15  Yes Alexa Sherral Hammers, MD  cefTRIAXone (ROCEPHIN) 250 MG injection Inject 250 mg into the muscle once.  FOR IM use in LARGE MUSCLE MASS Patient not taking: Reported on 09/05/2015 08/16/15   Collier Salina, MD  LORazepam (ATIVAN) 1 MG tablet Take 1 tablet (1 mg total) by mouth daily as needed for anxiety. Patient not taking: Reported on 09/05/2015 07/18/15   Alexa Sherral Hammers, MD  naproxen (NAPROSYN) 500 MG tablet Take 1 tablet (500 mg total) by mouth 2 (two) times daily. Patient not taking: Reported on 09/05/2015 08/27/15   [facility], PA-C  polyethylene glycol powder [facility]) powder Take 17 g by mouth daily as needed for moderate constipation. Patient not taking: Reported on 08/03/2015 06/14/15   Alexa Sherral Hammers, MD  sertraline (  ZOLOFT) 25 MG tablet Take 1 tablet (25 mg total) by mouth daily. Patient not taking: Reported on 08/03/2015 06/13/15   Alexa Sherral Hammers, MD   BP 101/70 mmHg  Pulse 79  Temp(Src) 98 F (36.7 C) (Oral)  Resp 19  SpO2 99%  LMP 08/14/2015 Physical Exam  Constitutional: She is oriented to person, place, and time. She appears well-developed and well-nourished. No distress.  HENT:  Head: Normocephalic.  Eyes: Conjunctivae are normal.  Neck: Neck supple. No tracheal deviation present.  Cardiovascular: Normal rate, regular  rhythm and normal heart sounds.   Pulmonary/Chest: Effort normal and breath sounds normal. No respiratory distress.  Abdominal: Soft. She exhibits no distension.  Neurological: She is alert and oriented to person, place, and time.  Skin: Skin is warm and dry.  Psychiatric: She has a normal mood and affect.    ED Course  Procedures (including critical care time) Labs Review Labs Reviewed - No data to display  Imaging Review No results found. I have personally reviewed and evaluated these images and lab results as part of my medical decision-making.   EKG Interpretation   Date/Time:  Wednesday September 05 2015 08:00:03 EST Ventricular Rate:  106 PR Interval:  144 QRS Duration: 82 QT Interval:  350 QTC Calculation: 464 R Axis:   33 Text Interpretation:  Sinus tachycardia Leads misplaced Otherwise no  significant change Confirmed by Chanda Laperle MD, Quillian Quince [id number]) on 09/05/2015  9:00:53 AM      MDM   Final diagnoses:  Palpitations    29 y.o. female presents with palpitaitons that are recurrent. Having on arrival. EKG with sinus tachycardia, has h/o prior SVT but not present on telemetry monitoring. Tachycardia resolved with no intervention. Recommended abstaining from caffeine or other stimulants. Plan to follow up with PCP as needed and return precautions discussed for worsening or new concerning symptoms.     Leo Grosser, MD 09/05/15 [phone]

## 2015-09-05 NOTE — ED Notes (Signed)
Pt reports feeling her heart racing yesterday. Pt states that she was walking when it began. Pt reports having to wear a heart monitor in the past. Pt states that she also has hx of anxiety and states that she was stressed over the holidays as well. NAD noted in triage.

## 2015-09-11 ENCOUNTER — Other Ambulatory Visit: Payer: Self-pay | Admitting: Internal Medicine

## 2015-09-11 ENCOUNTER — Encounter: Payer: Self-pay | Admitting: Internal Medicine

## 2015-09-11 ENCOUNTER — Ambulatory Visit (INDEPENDENT_AMBULATORY_CARE_PROVIDER_SITE_OTHER): Payer: Self-pay | Admitting: Internal Medicine

## 2015-09-11 VITALS — BP 135/83 | HR 105 | Temp 98.4°F | Wt 210.0 lb

## 2015-09-11 DIAGNOSIS — I471 Supraventricular tachycardia: Secondary | ICD-10-CM

## 2015-09-11 DIAGNOSIS — E039 Hypothyroidism, unspecified: Secondary | ICD-10-CM

## 2015-09-11 DIAGNOSIS — Z0000 Encounter for general adult medical examination without abnormal findings: Secondary | ICD-10-CM

## 2015-09-11 DIAGNOSIS — D509 Iron deficiency anemia, unspecified: Secondary | ICD-10-CM

## 2015-09-11 DIAGNOSIS — F411 Generalized anxiety disorder: Secondary | ICD-10-CM

## 2015-09-11 MED ORDER — ATENOLOL 50 MG PO TABS: 50.0000 mg | ORAL_TABLET | Freq: Every day | ORAL | Status: DC

## 2015-09-11 MED ORDER — SERTRALINE HCL 25 MG PO TABS: 25.0000 mg | ORAL_TABLET | Freq: Every day | ORAL | Status: DC

## 2015-09-11 NOTE — Assessment & Plan Note (Addendum)
Patient requests to have cholesterol checked.  Plan: -Lipid panel  Addendum: -Lipid panel WNL

## 2015-09-11 NOTE — Assessment & Plan Note (Signed)
Patient has a history of underlying generalized anxiety disorder with episodes that are also consistent with panic attacks. Patient reports compliance with Zoloft 25 mg daily. She has not been taking the Ativan, as she wants to control her panic episodes on her own. I offered Xanax as I do believe patient has underlying panic attacks as previously documented, but patient declines. Patient was referred to [facility] in November 2016, but I am unsure if she has gone.   Plan: -Continue Zoloft 25 mg daily for anxiety -Consider Xanax in the future if patient has uncontrolled, recurrent panic attacks

## 2015-09-11 NOTE — Assessment & Plan Note (Signed)
Patient presents for ED follow up from 12/28 when she was seen with complaint of heart palpitations. At the time, EKG revealed sinus tachycardia at 106 bpm. Tachycardia resolved on its own. Patient denies any anxiety or increased physical activity to explain her symptoms. She has been compliant with her medications; however, she is no longer taking the diltiazem, which was ok'ed by Cardiology as her holter monitor was unrevealing. Patient continues to have tachycardia at 105 today on exam, but seems to be regular rhythm. Patient has a history of atrial ectopic tachycardia, during which her TSH was very low. This resolved, and patient has been intermittently in sinus tachycardia. Etiology could be sinus tachycardia, underlying arrhythmia (unlikely given normal holter monitor), low TSH while on Synthroid (last TSH normal in August 2016), anxiety (although patient denies). Patient would like to resume atenolol- she denies being pregnant or trying to become pregnant.  Plan: -Resume Atenolol 50 mg daily -Recheck TSH -Continue Zoloft 25 mg daily for anxiety, offered Xanax as I do believe patient has underlying panic attacks as previously documented, but patient declines -Patient to follow up with Cardiology on 09/18/15

## 2015-09-11 NOTE — Progress Notes (Signed)
Subjective:    [name], female    DOB: 02-24-1986, 30 y.o.   MRN: [id number]  HPI Alyssa Hall is a 30 y.o. female with PMHx of anxiety, depression, hypothyroidism, Meniere's disease who presents to the clinic for follow up for palpitations. Please see A&P for the status of the patient's chronic medical problems.   Past Medical History  Diagnosis Date  . Thyrotoxicosis   . Atrial tachycardia ([place])   . Dizziness   . Hypothyroid   . Depression   . Anxiety   . Meniere's disease   . Anemia   . Fibroids   . Hypertension     Outpatient Encounter Prescriptions as of 09/11/2015  Medication Sig  . acetaminophen (TYLENOL) 500 MG tablet Take 1,000 mg by mouth every 6 (six) hours as needed for mild pain or moderate pain.  . ferrous sulfate 325 (65 FE) MG tablet Take 1 tablet (325 mg total) by mouth 3 (three) times daily with meals.  . hydrochlorothiazide (HYDRODIURIL) 25 MG tablet Take 1 tablet (25 mg total) by mouth daily.  Marland Kitchen levothyroxine (SYNTHROID, LEVOTHROID) 25 MCG tablet Take 1 tablet (25 mcg total) by mouth daily before breakfast.  . LORazepam (ATIVAN) 1 MG tablet Take 1 tablet (1 mg total) by mouth daily as needed for anxiety. (Patient not taking: Reported on 09/05/2015)  . naproxen (NAPROSYN) 500 MG tablet Take 1 tablet (500 mg total) by mouth 2 (two) times daily. (Patient not taking: Reported on 09/05/2015)  . polyethylene glycol powder (GLYCOLAX/MIRALAX) powder Take 17 g by mouth daily as needed for moderate constipation. (Patient not taking: Reported on 08/03/2015)  . potassium chloride SA (K-DUR,KLOR-CON) 20 MEQ tablet Take 1 tablet (20 mEq total) by mouth daily.  . sertraline (ZOLOFT) 25 MG tablet Take 1 tablet (25 mg total) by mouth daily. (Patient not taking: Reported on 08/03/2015)  . [DISCONTINUED] cefTRIAXone (ROCEPHIN) 250 MG injection Inject 250 mg into the muscle once.  FOR IM use in LARGE MUSCLE MASS (Patient not taking: Reported on 09/05/2015)   No  facility-administered encounter medications on file as of 09/11/2015.    Family History  Problem Relation Age of Onset  . Heart failure Maternal Aunt     Social History   Social History  . Marital Status: Single    Spouse Name: N/A  . Number of Children: N/A  . Years of Education: N/A   Occupational History  . Not on file.   Social History Main Topics  . Smoking status: Never Smoker   . Smokeless tobacco: Never Used  . Alcohol Use: No  . Drug Use: No  . Sexual Activity: Not Currently    Birth Control/ Protection: None   Other Topics Concern  . Not on file   Social History Narrative   Uninsured.   Lives in [place].         Review of Systems General: Denies fatigue, change in appetite and diaphoresis.  Respiratory: Denies SOB, cough, DOE.   Cardiovascular: Admits to palpitations. Denies chest pain.  Gastrointestinal: Denies nausea, vomiting, abdominal pain, diarrhea, constipation.  Endocrine: Denies hot or cold intolerance. Neurological: Denies dizziness, headaches, weakness, lightheadedness Psychiatric/Behavioral: Denies anxiety. Denies mood changes, nervousness, sleep disturbance and agitation.     Objective:   Physical Exam Filed Vitals:   09/11/15 1549  BP: 135/83  Pulse: 105  Temp: 98.4 F (36.9 C)  TempSrc: Oral  Weight: 210 lb (95.255 kg)  SpO2: 100%   General: Vital signs reviewed.  Patient is  well-developed and well-nourished, in no acute distress and cooperative with exam.  HEENT: Tympanic membranes normal. Cardiovascular: Tachycardic, regular rhythm, S1 normal, S2 normal, no murmurs, gallops, or rubs.  Pulmonary/Chest: Clear to auscultation bilaterally, no wheezes, rales, or rhonchi. Neurological: A&O x3 Skin: Warm, dry and intact.  Psychiatric: Normal mood and affect. speech and behavior is normal. Cognition and memory are normal.      Assessment & Plan:   Please see problem based assessment and plan.

## 2015-09-11 NOTE — Patient Instructions (Signed)
FOR YOUR HEART PALPITATIONS: WE WILL RESTART YOUR ATENOLOL 50 MG DAILY. I WILL ALSO RECHECK YOUR THYROID LEVEL.  CONTINUE TAKING ALL OTHER MEDICATIONS AS PRESCRIBED AND AS LISTED.  WE WILL CHECK YOUR IRON LEVELS, YOUR THYROID LEVEL, AND YOUR CHOLESTEROL TODAY.

## 2015-09-11 NOTE — Assessment & Plan Note (Addendum)
Patient reports compliance with Synthroid 25 mcg daily. Last TSH normal in August 2016. Given recurrent palpitations and history of low TSH, will recheck TSH today.  -Recheck TSH -Continue Synthroid 25 mcg daily  Addendum: TSH normal

## 2015-09-11 NOTE — Assessment & Plan Note (Addendum)
Patient has a history of iron deficiency anemia secondary to menorrhagia. She reports compliance with ferrous sulfate 325 mg daily. Hemoglobin has trended 12.2>9.7>10.9 in the last 6 months.   Plan: -Ferrous sulfate 325 mg daily -Recheck Iron and TIBC  Addendum: -Iron very low at 5 -Increase Ferrous sulfate to TID

## 2015-09-12 LAB — LIPID PANEL
Chol/HDL Ratio: 2.8 ratio units (ref 0.0–4.4)
Cholesterol, Total: 156 mg/dL (ref 100–199)
HDL: 55 mg/dL (ref >39)
LDL Calculated: 90 mg/dL (ref 0–99)
Triglycerides: 57 mg/dL (ref 0–149)
VLDL Cholesterol Cal: 11 mg/dL (ref 5–40)

## 2015-09-12 LAB — IRON AND TIBC
Iron Saturation: 5 % — CL (ref 15–55)
Iron: 21 ug/dL — ABNORMAL LOW (ref 27–159)
TIBC: 395 ug/dL (ref 250–450)
UIBC: 374 ug/dL (ref 131–425)

## 2015-09-12 LAB — TSH: TSH: 1.18 u[IU]/mL (ref 0.450–4.500)

## 2015-09-12 NOTE — Progress Notes (Signed)
Internal Medicine Clinic Attending  Case discussed with Dr. Richardson at the time of the visit.  We reviewed the resident's history and exam and pertinent patient test results.  I agree with the assessment, diagnosis, and plan of care documented in the resident's note. 

## 2015-09-14 ENCOUNTER — Telehealth: Payer: Self-pay | Admitting: Internal Medicine

## 2015-09-17 NOTE — Progress Notes (Signed)
This encounter was created in error - please disregard.

## 2015-09-18 ENCOUNTER — Encounter: Payer: Self-pay | Admitting: Cardiovascular Disease

## 2015-09-28 ENCOUNTER — Encounter: Payer: Self-pay | Admitting: Cardiovascular Disease

## 2015-09-28 ENCOUNTER — Ambulatory Visit (INDEPENDENT_AMBULATORY_CARE_PROVIDER_SITE_OTHER): Payer: BLUE CROSS/BLUE SHIELD | Admitting: Cardiovascular Disease

## 2015-09-28 VITALS — BP 128/94 | HR 107 | Ht 63.0 in | Wt 208.1 lb

## 2015-09-28 DIAGNOSIS — I10 Essential (primary) hypertension: Secondary | ICD-10-CM

## 2015-09-28 DIAGNOSIS — I471 Supraventricular tachycardia: Secondary | ICD-10-CM | POA: Diagnosis not present

## 2015-09-28 NOTE — Progress Notes (Signed)
Cardiology Office Note   Date:  09/29/2015   ID:  Alyssa Hall, DOB [birth year], MRN [id number]  PCP:  Osa Craver, MD  Cardiologist:   Sharol Harness, MD   Chief Complaint  Patient presents with  . Follow-up     no chest pain, occassional shortness of breath, has edema in Left foot, has pain on ankle, has lightheadedness & dizziness      [name] is a 30 y.o. female with HTN, atrial tachycardia, and hypothyroidism who presents for follow up on her palpitations.    Interval History 09/28/15: Since her last appointment, Alyssa Hall followed up with her PCP and Zoloft was prescribed on 10/5. She did not start taking it and followed up with her primary care physician on 07/17/04/2016. At that appointment Zoloft was again prescribed and she was given Ativan. She was also referred to [facility] or counseling. On 07/18/15 hydrochlorothiazide was started and triamterene was discontinued.  She was referred to the ED when EMS found her with her car parked in the middle of the street. She reportedly had dizziness,numbness and tingling in her hand. She also had nausea and dry heaves.  However, she left without being seen.  She was seen in the emergency department on 12/28 with palpitations. Her EKG heart revealed  atrial tachycardia with a rate of 106 bpm.  She followed up with her primary care physician on January 1, at which time atenolol 50 mg daily was started.  Her TSH was within normal limits.  She has been feeling well since I saw her.  She reports that her anxiety is better controlled.  However, she continues to have palpitations.  Earlier this month she had a bad episode of palpitations and could feel her heart beating in her ear.  At the time she had a cold and was very congested.  She has also noted shortness of breath.  It usually happens when she is driving.  She feels especially stressed when driving.  Alyssa Hall has started going to the gym.  She notes increased  palpitations when exercising and once felt as though she would pass out.  She denies chest pain or pressure.  She has not noted any improvement on atenolol.  History of Present Illness 06/06/15:  Alyssa Hall was seen in clinic one month ago reporting palpitations. We checked her thyroid level which was normal and switched her atenolol to diltiazem. Prior to making this medication change we also obtained a Holter which revealed sinus arrhythmia and one PAC but no other abnormalities. She did report symptoms during the testing time.   Since switching to diltiazem she has not noted any change in her symptoms. She continues to report frequent palpitations associated with shortness of breath, lightheadedness, and dizziness. She endorses significant anxiety and stress. She feels as though everyone in her family is against her and does not have a good relationship with them right now. She has one friend with whom she can talk, but not many other outlets.    Past Medical History  Diagnosis Date  . Thyrotoxicosis   . Atrial tachycardia ([place])   . Dizziness   . Hypothyroid   . Depression   . Anxiety   . Meniere's disease   . Anemia   . Fibroids   . Hypertension     No past surgical history on file.   Current Outpatient Prescriptions  Medication Sig Dispense Refill  . acetaminophen (TYLENOL) 500 MG tablet Take 1,000 mg by  mouth every 6 (six) hours as needed for mild pain or moderate pain.    Marland Kitchen atenolol (TENORMIN) 50 MG tablet Take 1 tablet (50 mg total) by mouth daily. 30 tablet 11  . ferrous sulfate 325 (65 FE) MG tablet Take 1 tablet (325 mg total) by mouth 3 (three) times daily with meals. 90 tablet 3  . hydrochlorothiazide (HYDRODIURIL) 25 MG tablet Take 1 tablet (25 mg total) by mouth daily. 30 tablet 11  . levothyroxine (SYNTHROID, LEVOTHROID) 25 MCG tablet Take 1 tablet (25 mcg total) by mouth daily before breakfast. 30 tablet 11  . naproxen (NAPROSYN) 500 MG tablet Take 1 tablet (500 mg  total) by mouth 2 (two) times daily. 30 tablet 0  . polyethylene glycol powder (GLYCOLAX/MIRALAX) powder Take 17 g by mouth daily as needed for moderate constipation. 255 g 0  . potassium chloride SA (K-DUR,KLOR-CON) 20 MEQ tablet Take 1 tablet (20 mEq total) by mouth daily. 30 tablet 3  . sertraline (ZOLOFT) 25 MG tablet Take 1 tablet (25 mg total) by mouth daily. (Patient not taking: Reported on 09/29/2015) 30 tablet 11   No current facility-administered medications for this visit.    Allergies:   Review of patient's allergies indicates no known allergies.    Social History:  The patient  reports that she has never smoked. She has never used smokeless tobacco. She reports that she does not drink alcohol or use illicit drugs.   Family History:  The patient's family history includes Heart failure in her maternal aunt.    ROS:  Please see the history of present illness.   Otherwise, review of systems are positive for none.   All other systems are reviewed and negative.    PHYSICAL EXAM: VS:  BP 128/94 mmHg  Pulse 107  Ht 5\' 3"  (1.6 m)  Wt 94.405 kg (208 lb 2 oz)  BMI 36.88 kg/m2  LMP 09/27/2015 , BMI Body mass index is 36.88 kg/(m^2). GENERAL:  Well appearing HEENT:  Pupils equal round and reactive, fundi not visualized, oral mucosa unremarkable NECK:  No jugular venous distention, waveform within normal limits, carotid upstroke brisk and symmetric, no bruits, no thyromegaly LYMPHATICS:  No cervical adenopathy LUNGS:  Clear to auscultation bilaterally HEART:  Tachycardic.  Regular rhythm.  PMI not displaced or sustained,S1 and S2 within normal limits, no S3, no S4, no clicks, no rubs, no murmurs ABD:  Flat, positive bowel sounds normal in frequency in pitch, no bruits, no rebound, no guarding, no midline pulsatile mass, no hepatomegaly, no splenomegaly EXT:  2 plus pulses throughout, no edema, no cyanosis no clubbing SKIN:  No rashes no nodules NEURO:  Cranial nerves II through XII  grossly intact, motor grossly intact throughout PSYCH:  Cognitively intact, oriented to person place and time   EKG:  EKG is ordered today. Ectopic atrial rhythm. Rate 107 bpm.  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  TTE 07/2014: EF 55-60%.  E-A fusion.  Recent Labs: 02/27/2015: ALT 13* 08/03/2015: Hemoglobin 10.9*; Platelets 418* 08/16/2015: BUN 11; Creatinine, Ser 0.74; Potassium 4.5; Sodium 141 09/11/2015: TSH 1.180    Lipid Panel    Component Value Date/Time   CHOL 156 09/11/2015 1625   TRIG 57 09/11/2015 1625   HDL 55 09/11/2015 1625   CHOLHDL 2.8 09/11/2015 1625   LDLCALC 90 09/11/2015 1625      Wt Readings from Last 3 Encounters:  09/29/15 94.348 kg (208 lb)  09/28/15 94.405 kg (208 lb 2 oz)  09/11/15 95.255  kg (210 lb)     Other studies Reviewed: Additional studies/ records that were reviewed today include:  Review of the above records demonstrates:  Please see elsewhere in the note.    24 Hour Holter Monitor 04/27/15:  Quality: Fair. Baseline artifact. Predominant rhythm: sinus rhythms Other rhythms: multiple episodes of sinus arrhythmia, one episode of ectopic atrial rhythm at 75 bpm,  Average heart rate: 78 bpm Max heart rate: 125 bpm Min heart rate: 65 bpm Pauses >2.5 seconds: 0 Ventricular ectopics: 0 Patient did not submit a symptom diary  ASSESSMENT AND PLAN:  # Ectopic atrial rhythm:  Alyssa Hall continues to have episodes of palpitations.  Her anxiety is under much better control, yet she returns to clinic with a heart rate >100.  She consistently has an ectopic rhythm on EKG.  There are no other clear triggers for tachycardia, as her thyroid and electrolytes are normal.  She does not drink caffeine or use OTC decongestants.  Although her Holter did not reveal any significant arrhythmias, I suspect that she is having tachycardia at home that is causing symptoms.  Her heart rate is persistently elevated on ECG on both diltiazem and atenolol.  We  discussed the options, including continuing to titrate nodal agents, antiarrhythmics and EP study with possible ablation.  She is interested in talking with an electrophysiologist about the possibility of ablation.  I will refer her to Dr. Curt Bears for this discussion.  # Hypertension: BP well-controlled on HCTZ and atenolol.    # Anxiety: Much better controlled on Zoloft.  She was cheerful and bright today.  Current medicines are reviewed at length with the patient today.  The patient does not have concerns regarding medicines.  The following changes have been made:  no change  Labs/ tests ordered today include: none  Orders Placed This Encounter  Procedures  . Ambulatory referral to Cardiac Electrophysiology  . EKG 12-Lead     Disposition:   FU with Dr. Jonelle Sidle C. [place] in 6 months.   Signed, Sharol Harness, MD  09/29/2015 4:07 PM    [facility]

## 2015-09-28 NOTE — Patient Instructions (Signed)
Dr Oval Linsey has made no changes today in your current medications or treatment plan.  Your physician has referred you to an Electrophysiologist, Dr Elliot Cousin.  Dr Oval Linsey recommends that you schedule a follow-up appointment in 6 months. You will receive a reminder letter in the mail two months in advance. If you don't receive a letter, please call our office to schedule the follow-up appointment.  If you need a refill on your cardiac medications before your next appointment, please call your pharmacy.

## 2015-09-29 ENCOUNTER — Emergency Department (HOSPITAL_COMMUNITY)
Admission: EM | Admit: 2015-09-29 | Discharge: 2015-09-29 | Disposition: A | Discharge status: Home / Self Care | Payer: BLUE CROSS/BLUE SHIELD | Attending: Emergency Medicine | Admitting: Emergency Medicine

## 2015-09-29 ENCOUNTER — Encounter: Payer: Self-pay | Admitting: Cardiovascular Disease

## 2015-09-29 DIAGNOSIS — F419 Anxiety disorder, unspecified: Secondary | ICD-10-CM | POA: Insufficient documentation

## 2015-09-29 DIAGNOSIS — I471 Supraventricular tachycardia: Secondary | ICD-10-CM | POA: Insufficient documentation

## 2015-09-29 DIAGNOSIS — Y9289 Other specified places as the place of occurrence of the external cause: Secondary | ICD-10-CM | POA: Diagnosis not present

## 2015-09-29 DIAGNOSIS — S40861A Insect bite (nonvenomous) of right upper arm, initial encounter: Secondary | ICD-10-CM | POA: Insufficient documentation

## 2015-09-29 DIAGNOSIS — E039 Hypothyroidism, unspecified: Secondary | ICD-10-CM | POA: Diagnosis not present

## 2015-09-29 DIAGNOSIS — Z79899 Other long term (current) drug therapy: Secondary | ICD-10-CM | POA: Diagnosis not present

## 2015-09-29 DIAGNOSIS — Y998 Other external cause status: Secondary | ICD-10-CM | POA: Diagnosis not present

## 2015-09-29 DIAGNOSIS — Z86018 Personal history of other benign neoplasm: Secondary | ICD-10-CM | POA: Insufficient documentation

## 2015-09-29 DIAGNOSIS — Z8669 Personal history of other diseases of the nervous system and sense organs: Secondary | ICD-10-CM | POA: Insufficient documentation

## 2015-09-29 DIAGNOSIS — W57XXXA Bitten or stung by nonvenomous insect and other nonvenomous arthropods, initial encounter: Secondary | ICD-10-CM | POA: Diagnosis not present

## 2015-09-29 DIAGNOSIS — D649 Anemia, unspecified: Secondary | ICD-10-CM | POA: Diagnosis not present

## 2015-09-29 DIAGNOSIS — Y9389 Activity, other specified: Secondary | ICD-10-CM | POA: Insufficient documentation

## 2015-09-29 DIAGNOSIS — I10 Essential (primary) hypertension: Secondary | ICD-10-CM | POA: Diagnosis not present

## 2015-09-29 DIAGNOSIS — Z791 Long term (current) use of non-steroidal anti-inflammatories (NSAID): Secondary | ICD-10-CM | POA: Insufficient documentation

## 2015-09-29 NOTE — Discharge Instructions (Signed)
Insect Bite Mosquitoes, flies, fleas, bedbugs, and many other insects can bite. Insect bites are different from insect stings. A sting is when poison (venom) is injected into the skin. Insect bites can cause pain or itching for a few days, but they are usually not serious. Some insects can spread diseases to people through a bite. SYMPTOMS  Symptoms of an insect bite include:  Itching or pain in the bite area.  Redness and swelling in the bite area.  An open wound (skin ulcer). In many cases, symptoms last for 2-4 days.  DIAGNOSIS  This condition is usually diagnosed based on symptoms and a physical exam. TREATMENT  Treatment is usually not needed for an insect bite. Symptoms often go away on their own. Your health care provider may recommend creams or lotions to help reduce itching. Antibiotic medicines may be prescribed if the bite becomes infected. A tetanus shot may be given in some cases. If you develop an allergic reaction to an insect bite, your health care provider will prescribe medicines to treat the reaction (antihistamines). This is rare. HOME CARE INSTRUCTIONS  Do not scratch the bite area.  Keep the bite area clean and dry. Wash the bite area daily with soap and water as told by your health care provider.  If directed, applyice to the bite area.  Put ice in a plastic bag.  Place a towel between your skin and the bag.  Leave the ice on for 20 minutes, 2-3 times per day.  To help reduce itching and swelling, try applying a baking soda paste, cortisone cream, or calamine lotion to the bite area as told by your health care provider.  Apply or take over-the-counter and prescription medicines only as told by your health care provider.  If you were prescribed an antibiotic medicine, use it as told by your health care provider. Do not stop using the antibiotic even if your condition improves.  Keep all follow-up visits as told by your health care provider. This is  important. PREVENTION   Use insect repellent. The best insect repellents contain:  DEET, picaridin, oil of lemon eucalyptus (OLE), or IR3535.  Higher amounts of an active ingredient.  When you are outdoors, wear clothing that covers your arms and legs.  Avoid opening windows that do not have window screens. SEEK MEDICAL CARE IF:  You have increased redness, swelling, or pain in the bite area.  You have a fever. SEEK IMMEDIATE MEDICAL CARE IF:   You have joint pain.   You have fluid, blood, or pus coming from the bite area.  You have a headache or neck pain.  You have unusual weakness.  You have a rash.  You have chest pain or shortness of breath.  You have abdominal pain, nausea, or vomiting.  You feel unusually tired or sleepy.   This information is not intended to replace advice given to you by your health care provider. Make sure you discuss any questions you have with your health care provider.   Document Released: 10/02/2004 Document Revised: 05/16/2015 Document Reviewed: 01/10/2015 Elsevier Interactive Patient Education 2016 [employer] Inc[employer]  

## 2015-09-29 NOTE — ED Notes (Signed)
Patient right arm is itchy. Patient has some swelling in upper right arm. Patient states that it is not painful. She says it is just itchy.

## 2015-09-30 ENCOUNTER — Emergency Department (HOSPITAL_COMMUNITY)
Admission: EM | Admit: 2015-09-30 | Discharge: 2015-10-01 | Disposition: A | Discharge status: Home / Self Care | Payer: BLUE CROSS/BLUE SHIELD | Attending: Emergency Medicine | Admitting: Emergency Medicine

## 2015-09-30 ENCOUNTER — Encounter (HOSPITAL_COMMUNITY): Payer: Self-pay | Admitting: Emergency Medicine

## 2015-09-30 DIAGNOSIS — Z791 Long term (current) use of non-steroidal anti-inflammatories (NSAID): Secondary | ICD-10-CM | POA: Diagnosis not present

## 2015-09-30 DIAGNOSIS — Z79899 Other long term (current) drug therapy: Secondary | ICD-10-CM | POA: Diagnosis not present

## 2015-09-30 DIAGNOSIS — E0590 Thyrotoxicosis, unspecified without thyrotoxic crisis or storm: Secondary | ICD-10-CM | POA: Diagnosis not present

## 2015-09-30 DIAGNOSIS — T7840XA Allergy, unspecified, initial encounter: Secondary | ICD-10-CM | POA: Diagnosis present

## 2015-09-30 DIAGNOSIS — L500 Allergic urticaria: Secondary | ICD-10-CM | POA: Insufficient documentation

## 2015-09-30 DIAGNOSIS — W57XXXA Bitten or stung by nonvenomous insect and other nonvenomous arthropods, initial encounter: Secondary | ICD-10-CM | POA: Diagnosis not present

## 2015-09-30 DIAGNOSIS — I10 Essential (primary) hypertension: Secondary | ICD-10-CM | POA: Diagnosis not present

## 2015-09-30 DIAGNOSIS — Y9289 Other specified places as the place of occurrence of the external cause: Secondary | ICD-10-CM | POA: Insufficient documentation

## 2015-09-30 DIAGNOSIS — E039 Hypothyroidism, unspecified: Secondary | ICD-10-CM | POA: Diagnosis not present

## 2015-09-30 DIAGNOSIS — F419 Anxiety disorder, unspecified: Secondary | ICD-10-CM | POA: Diagnosis not present

## 2015-09-30 DIAGNOSIS — Z86018 Personal history of other benign neoplasm: Secondary | ICD-10-CM | POA: Diagnosis not present

## 2015-09-30 DIAGNOSIS — Z8669 Personal history of other diseases of the nervous system and sense organs: Secondary | ICD-10-CM | POA: Diagnosis not present

## 2015-09-30 DIAGNOSIS — D649 Anemia, unspecified: Secondary | ICD-10-CM | POA: Diagnosis not present

## 2015-09-30 DIAGNOSIS — L509 Urticaria, unspecified: Secondary | ICD-10-CM

## 2015-09-30 DIAGNOSIS — Y998 Other external cause status: Secondary | ICD-10-CM | POA: Insufficient documentation

## 2015-09-30 MED ORDER — FAMOTIDINE IN NACL 20-0.9 MG/50ML-% IV SOLN
20.0000 mg | Freq: Once | INTRAVENOUS | Status: AC
  Administered 2015-09-30: 20 mg via INTRAVENOUS
  Filled 2015-09-30: qty 50

## 2015-09-30 MED ORDER — SODIUM CHLORIDE 0.9 % IV BOLUS (SEPSIS)
500.0000 mL | Freq: Once | INTRAVENOUS | Status: AC
  Administered 2015-09-30: 500 mL via INTRAVENOUS

## 2015-09-30 MED ORDER — DEXAMETHASONE SODIUM PHOSPHATE 10 MG/ML IJ SOLN
10.0000 mg | Freq: Once | INTRAMUSCULAR | Status: AC
  Administered 2015-09-30: 10 mg via INTRAVENOUS
  Filled 2015-09-30: qty 1

## 2015-09-30 MED ORDER — DIPHENHYDRAMINE HCL 50 MG/ML IJ SOLN
25.0000 mg | Freq: Once | INTRAMUSCULAR | Status: AC
  Administered 2015-09-30: 25 mg via INTRAVENOUS
  Filled 2015-09-30: qty 1

## 2015-09-30 NOTE — ED Provider Notes (Signed)
CSN: [id number]     Arrival date & time 09/29/15  [id number] History   First MD Initiated Contact with Patient 09/29/15 [phone]     Chief Complaint  Patient presents with  . Insect Bite     (Consider location/radiation/quality/duration/timing/severity/associated sxs/prior Treatment) HPI Comments: Patient presents with itching rash to right arm. No drainage, fever, or ulceration. No pain. Symptoms started this evening.  The history is provided by the patient. No language interpreter was used.    Past Medical History  Diagnosis Date  . Thyrotoxicosis   . Atrial tachycardia ([place])   . Dizziness   . Hypothyroid   . Depression   . Anxiety   . Meniere's disease   . Anemia   . Fibroids   . Hypertension    No past surgical history on file. Family History  Problem Relation Age of Onset  . Heart failure Maternal Aunt    Social History  Substance Use Topics  . Smoking status: Never Smoker   . Smokeless tobacco: Never Used  . Alcohol Use: No   OB History    Gravida Para Term Preterm AB TAB SAB Ectopic Multiple Living   1    1 1          Review of Systems  Constitutional: Negative for fever.  Musculoskeletal: Negative for myalgias.  Skin: Positive for rash.      Allergies  Review of patient's allergies indicates no known allergies.  Home Medications   Prior to Admission medications   Medication Sig Start Date End Date Taking? Authorizing Provider  acetaminophen (TYLENOL) 500 MG tablet Take 1,000 mg by mouth every 6 (six) hours as needed for mild pain or moderate pain.   Yes Historical Provider, MD  atenolol (TENORMIN) 50 MG tablet Take 1 tablet (50 mg total) by mouth daily. 09/11/15  Yes Alexa Sherral Hammers, MD  ferrous sulfate 325 (65 FE) MG tablet Take 1 tablet (325 mg total) by mouth 3 (three) times daily with meals. 06/14/15  Yes Alexa Sherral Hammers, MD  hydrochlorothiazide (HYDRODIURIL) 25 MG tablet Take 1 tablet (25 mg total) by mouth daily. 07/18/15  Yes Alexa Sherral Hammers, MD   levothyroxine (SYNTHROID, LEVOTHROID) 25 MCG tablet Take 1 tablet (25 mcg total) by mouth daily before breakfast. 03/15/15  Yes Alexa Sherral Hammers, MD  naproxen (NAPROSYN) 500 MG tablet Take 1 tablet (500 mg total) by mouth 2 (two) times daily. 08/27/15  Yes Jaime Pilcher Ward, PA-C  polyethylene glycol powder (GLYCOLAX/MIRALAX) powder Take 17 g by mouth daily as needed for moderate constipation. 06/14/15  Yes Alexa Sherral Hammers, MD  potassium chloride SA (K-DUR,KLOR-CON) 20 MEQ tablet Take 1 tablet (20 mEq total) by mouth daily. 07/18/15  Yes Alexa Sherral Hammers, MD  sertraline (ZOLOFT) 25 MG tablet Take 1 tablet (25 mg total) by mouth daily. Patient not taking: Reported on 09/29/2015 09/11/15   Alexa Sherral Hammers, MD   BP 124/90 mmHg  Pulse 78  Temp(Src) 98.2 F (36.8 C) (Oral)  Resp 14  Ht 5\' 5"  (1.651 m)  Wt 94.348 kg  BMI 34.61 kg/m2  SpO2 99%  LMP 09/27/2015 Physical Exam  Constitutional: She is oriented to person, place, and time. She appears well-developed and well-nourished.  Neck: Normal range of motion.  Pulmonary/Chest: Effort normal.  Neurological: She is alert and oriented to person, place, and time.  Skin: Skin is warm and dry.  Singular, raised nonpustular bumps right forearm c/w insect bites.     ED Course  Procedures (including critical  care time) Labs Review Labs Reviewed - No data to display  Imaging Review No results found. I have personally reviewed and evaluated these images and lab results as part of my medical decision-making.   EKG Interpretation None      MDM   Final diagnoses:  Insect bites    Uncomplicated insect bites right arm.     Charlann Lange, PA-C [id number] [phone]  Delora Fuel, MD [phone]

## 2015-09-30 NOTE — ED Notes (Addendum)
C/o swelling, pain, and itching to bilateral upper arms since yesterday. Denies sob or any other symptoms.  Per chart, pt was seen at [facility] for same yesterday and symptoms were initially only on R arm.

## 2015-09-30 NOTE — ED Notes (Signed)
PA at bedside.

## 2015-10-01 MED ORDER — HYDROCORTISONE 1 % EX CREA: TOPICAL_CREAM | CUTANEOUS | Status: DC

## 2015-10-01 MED ORDER — HYDROXYZINE HCL 25 MG PO TABS: 25.0000 mg | ORAL_TABLET | Freq: Four times a day (QID) | ORAL | Status: DC

## 2015-10-01 NOTE — Discharge Instructions (Signed)
Allergies An allergy is an abnormal reaction to a substance by the body's defense system (immune system). Allergies can develop at any age. WHAT CAUSES ALLERGIES? An allergic reaction happens when the immune system mistakenly reacts to a normally harmless substance, called an allergen, as if it were harmful. The immune system releases antibodies to fight the substance. Antibodies eventually release a chemical called histamine into the bloodstream. The release of histamine is meant to protect the body from infection, but it also causes discomfort. An allergic reaction can be triggered by:  Eating an allergen.  Inhaling an allergen.  Touching an allergen. WHAT TYPES OF ALLERGIES ARE THERE? There are many types of allergies. Common types include:  Seasonal allergies. People with this type of allergy are usually allergic to substances that are only present during certain seasons, such as molds and pollens.  Food allergies.  Drug allergies.  Insect allergies.  Animal dander allergies. WHAT ARE SYMPTOMS OF ALLERGIES? Possible allergy symptoms include:  Swelling of the lips, face, tongue, mouth, or throat.  Sneezing, coughing, or wheezing.  Nasal congestion.  Tingling in the mouth.  Rash.  Itching.  Itchy, red, swollen areas of skin (hives).  Watery eyes.  Vomiting.  Diarrhea.  Dizziness.  Lightheadedness.  Fainting.  Trouble breathing or swallowing.  Chest tightness.  Rapid heartbeat. HOW ARE ALLERGIES DIAGNOSED? Allergies are diagnosed with a medical and family history and one or more of the following:  Skin tests.  Blood tests.  A food diary. A food diary is a record of all the foods and drinks you have in a day and of all the symptoms you experience.  The results of an elimination diet. An elimination diet involves eliminating foods from your diet and then adding them back in one by one to find out if a certain food causes an allergic reaction. HOW ARE  ALLERGIES TREATED? There is no cure for allergies, but allergic reactions can be treated with medicine. Severe reactions usually need to be treated at a hospital. HOW CAN REACTIONS BE PREVENTED? The best way to prevent an allergic reaction is by avoiding the substance you are allergic to. Allergy shots and medicines can also help prevent reactions in some cases. People with severe allergic reactions may be able to prevent a life-threatening reaction called anaphylaxis with a medicine given right after exposure to the allergen.   This information is not intended to replace advice given to you by your health care provider. Make sure you discuss any questions you have with your health care provider.   Document Released: 11/18/2002 Document Revised: 09/15/2014 Document Reviewed: 06/06/2014 Elsevier Interactive Patient Education 2016 [place] are itchy, red, swollen areas of the skin. They can vary in size and location on your body. Hives can come and go for hours or several days (acute hives) or for several weeks (chronic hives). Hives do not spread from person to person (noncontagious). They may get worse with scratching, exercise, and emotional stress. CAUSES   Allergic reaction to food, additives, or drugs.  Infections, including the common cold.  Illness, such as vasculitis, lupus, or thyroid disease.  Exposure to sunlight, heat, or cold.  Exercise.  Stress.  Contact with chemicals. SYMPTOMS   Red or white swollen patches on the skin. The patches may change size, shape, and location quickly and repeatedly.  Itching.  Swelling of the hands, feet, and face. This may occur if hives develop deeper in the skin. DIAGNOSIS  Your caregiver can usually tell  what is wrong by performing a physical exam. Skin or blood tests may also be done to determine the cause of your hives. In some cases, the cause cannot be determined. TREATMENT  Mild cases usually get better with  medicines such as antihistamines. Severe cases may require an emergency epinephrine injection. If the cause of your hives is known, treatment includes avoiding that trigger.  HOME CARE INSTRUCTIONS   Avoid causes that trigger your hives.  Take antihistamines as directed by your caregiver to reduce the severity of your hives. Non-sedating or low-sedating antihistamines are usually recommended. Do not drive while taking an antihistamine.  Take any other medicines prescribed for itching as directed by your caregiver.  Wear loose-fitting clothing.  Keep all follow-up appointments as directed by your caregiver. SEEK MEDICAL CARE IF:   You have persistent or severe itching that is not relieved with medicine.  You have painful or swollen joints. SEEK IMMEDIATE MEDICAL CARE IF:   You have a fever.  Your tongue or lips are swollen.  You have trouble breathing or swallowing.  You feel tightness in the throat or chest.  You have abdominal pain. These problems may be the first sign of a life-threatening allergic reaction. Call your local emergency services (911 in [place] MAKE SURE YOU:   Understand these instructions.  Will watch your condition.  Will get help right away if you are not doing well or get worse.   This information is not intended to replace advice given to you by your health care provider. Make sure you discuss any questions you have with your health care provider.   Follow up with your PCP if symptoms do not improve. Take atarax as needed for itching. Apply hydrocortisone cream liberally. Return to the ED if you experience severe worsening of your symptoms, lip or tongue swelling, difficulty breathing or swallowing.

## 2015-10-03 ENCOUNTER — Ambulatory Visit (INDEPENDENT_AMBULATORY_CARE_PROVIDER_SITE_OTHER): Payer: BLUE CROSS/BLUE SHIELD | Admitting: Pulmonary Disease

## 2015-10-03 ENCOUNTER — Encounter: Payer: Self-pay | Admitting: Pulmonary Disease

## 2015-10-03 VITALS — BP 126/78 | HR 77 | Temp 98.2°F | Ht 63.0 in | Wt 208.6 lb

## 2015-10-03 DIAGNOSIS — R102 Pelvic and perineal pain: Secondary | ICD-10-CM

## 2015-10-03 DIAGNOSIS — N760 Acute vaginitis: Secondary | ICD-10-CM

## 2015-10-03 DIAGNOSIS — B9689 Other specified bacterial agents as the cause of diseases classified elsewhere: Secondary | ICD-10-CM | POA: Diagnosis not present

## 2015-10-03 LAB — POCT URINALYSIS DIPSTICK
Bilirubin, UA: NEGATIVE
GLUCOSE UA: NEGATIVE
Ketones, UA: NEGATIVE
Leukocytes, UA: NEGATIVE
NITRITE UA: NEGATIVE
PROTEIN UA: NEGATIVE
RBC UA: NEGATIVE
SPEC GRAV UA: 1.025
UROBILINOGEN UA: 0.2
pH, UA: 5.5

## 2015-10-03 LAB — POCT URINE PREGNANCY: PREG TEST UR: NEGATIVE

## 2015-10-03 NOTE — Progress Notes (Signed)
   Subjective:    [name], female    DOB: 1986/07/29, 29 y.o.   MRN: [id number]  HPI [name] is a 30 year old woman with history of hypothyroid, fibroids, anemia, HTN, depression/anxiety presenting for suprapubic pain.  She reports her pain started when her menstruation ended on Monday. Her menstruation had started on Tuesday. Her periods are usually irregular because of her history of fibroids. The pain is suprapubic. She reports some dysuria. Denies hematuria. Denies fevers or chills. Denies vaginal discharge. She has not been sexually active since November. Her boyfriend lives in [place].  Review of Systems Respiratory: no shortness of breath Cardiovascular: no chest pain Gastrointestinal: no nausea/vomiting, no abdominal pain, no constipation, no diarrhea  Past Medical History  Diagnosis Date  . Thyrotoxicosis   . Atrial tachycardia ([place])   . Dizziness   . Hypothyroid   . Depression   . Anxiety   . Meniere's disease   . Anemia   . Fibroids   . Hypertension     Current Outpatient Prescriptions on File Prior to Visit  Medication Sig Dispense Refill  . acetaminophen (TYLENOL) 500 MG tablet Take 1,000 mg by mouth every 6 (six) hours as needed for mild pain or moderate pain.    Marland Kitchen atenolol (TENORMIN) 50 MG tablet Take 1 tablet (50 mg total) by mouth daily. 30 tablet 11  . ferrous sulfate 325 (65 FE) MG tablet Take 1 tablet (325 mg total) by mouth 3 (three) times daily with meals. 90 tablet 3  . hydrochlorothiazide (HYDRODIURIL) 25 MG tablet Take 1 tablet (25 mg total) by mouth daily. 30 tablet 11  . hydrocortisone cream 1 % Apply to affected area 2 times daily 15 g 0  . hydrOXYzine (ATARAX/VISTARIL) 25 MG tablet Take 1 tablet (25 mg total) by mouth every 6 (six) hours. 12 tablet 0  . levothyroxine (SYNTHROID, LEVOTHROID) 25 MCG tablet Take 1 tablet (25 mcg total) by mouth daily before breakfast. 30 tablet 11  . naproxen (NAPROSYN) 500 MG tablet Take 1  tablet (500 mg total) by mouth 2 (two) times daily. 30 tablet 0  . polyethylene glycol powder (GLYCOLAX/MIRALAX) powder Take 17 g by mouth daily as needed for moderate constipation. 255 g 0  . potassium chloride SA (K-DUR,KLOR-CON) 20 MEQ tablet Take 1 tablet (20 mEq total) by mouth daily. 30 tablet 3  . sertraline (ZOLOFT) 25 MG tablet Take 1 tablet (25 mg total) by mouth daily. (Patient not taking: Reported on 09/29/2015) 30 tablet 11   No current facility-administered medications on file prior to visit.    Today's Vitals   10/03/15 1346 10/03/15 1348  BP: 126/78   Pulse: 77   Temp: 98.2 F (36.8 C)   TempSrc: Oral   Height: 5\' 3"  (1.6 m)   Weight: 208 lb 9.6 oz (94.62 kg)   SpO2: 100%   PainSc: 5  5   PainLoc: Abdomen     Objective:   Physical Exam  Constitutional: She appears well-developed and well-nourished. No distress.  Cardiovascular: Normal rate, regular rhythm and normal heart sounds.   Pulmonary/Chest: Effort normal and breath sounds normal. She has no wheezes. She has no rales.  Abdominal: Soft. Bowel sounds are normal. She exhibits no distension. There is tenderness (suprapubic).  No CVA tenderness  Genitourinary: Cervix exhibits no motion tenderness, no discharge and no friability.   Assessment & Plan:  Please refer to problem based charting.

## 2015-10-03 NOTE — ED Provider Notes (Signed)
CSN: [id number]     Arrival date & time 09/30/15  1801 History   First MD Initiated Contact with Patient 09/30/15 2053     Chief Complaint  Patient presents with  . Allergic Reaction     (Consider location/radiation/quality/duration/timing/severity/associated sxs/prior Treatment) HPI   Alyssa Hall is a 30 y.o F who presents to the ED today c/o hives. Pt states that she was seen at [facility] on 1/21 with an insect bite to her R arm. Pt was discharged home without complications. Pt states that since then she has had increased redness that has now spread to bilateral upper extremities. Pt states that her rash has gotten worse, very pruritic, red and warm to the touch. Pt has not taken anything for her symptoms. No lip or tongue swelling. No difficulty breathing. No fevers.    Past Medical History  Diagnosis Date  . Thyrotoxicosis   . Atrial tachycardia ([place])   . Dizziness   . Hypothyroid   . Depression   . Anxiety   . Meniere's disease   . Anemia   . Fibroids   . Hypertension    History reviewed. No pertinent past surgical history. Family History  Problem Relation Age of Onset  . Heart failure Maternal Aunt    Social History  Substance Use Topics  . Smoking status: Never Smoker   . Smokeless tobacco: Never Used  . Alcohol Use: No   OB History    Gravida Para Term Preterm AB TAB SAB Ectopic Multiple Living   1    1 1          Review of Systems  All other systems reviewed and are negative.     Allergies  Review of patient's allergies indicates no known allergies.  Home Medications   Prior to Admission medications   Medication Sig Start Date End Date Taking? Authorizing Provider  acetaminophen (TYLENOL) 500 MG tablet Take 1,000 mg by mouth every 6 (six) hours as needed for mild pain or moderate pain.    Historical Provider, MD  atenolol (TENORMIN) 50 MG tablet Take 1 tablet (50 mg total) by mouth daily. 09/11/15   Alexa Sherral Hammers, MD  ferrous sulfate 325 (65  FE) MG tablet Take 1 tablet (325 mg total) by mouth 3 (three) times daily with meals. 06/14/15   Alexa Sherral Hammers, MD  hydrochlorothiazide (HYDRODIURIL) 25 MG tablet Take 1 tablet (25 mg total) by mouth daily. 07/18/15   Alexa Sherral Hammers, MD  hydrocortisone cream 1 % Apply to affected area 2 times daily 10/01/15   Laval Cafaro Tripp Naisha Wisdom, PA-C  hydrOXYzine (ATARAX/VISTARIL) 25 MG tablet Take 1 tablet (25 mg total) by mouth every 6 (six) hours. 10/01/15   Evann Erazo Tripp Kharson Rasmusson, PA-C  levothyroxine (SYNTHROID, LEVOTHROID) 25 MCG tablet Take 1 tablet (25 mcg total) by mouth daily before breakfast. 03/15/15   Alexa Sherral Hammers, MD  naproxen (NAPROSYN) 500 MG tablet Take 1 tablet (500 mg total) by mouth 2 (two) times daily. 08/27/15   Ozella Almond Ward, PA-C  polyethylene glycol powder (GLYCOLAX/MIRALAX) powder Take 17 g by mouth daily as needed for moderate constipation. 06/14/15   Alexa Sherral Hammers, MD  potassium chloride SA (K-DUR,KLOR-CON) 20 MEQ tablet Take 1 tablet (20 mEq total) by mouth daily. 07/18/15   Alexa Sherral Hammers, MD  sertraline (ZOLOFT) 25 MG tablet Take 1 tablet (25 mg total) by mouth daily. Patient not taking: Reported on 09/29/2015 09/11/15   Alexa Sherral Hammers, MD   BP 95/53 mmHg  Pulse 71  Temp(Src) 97.6 F (36.4 C) (Oral)  Resp 20  Ht 5\' 3"  (1.6 m)  Wt 93.441 kg  BMI 36.50 kg/m2  SpO2 98%  LMP 09/27/2015 Physical Exam  Constitutional: She is oriented to person, place, and time. She appears well-developed and well-nourished. No distress.  HENT:  Head: Normocephalic and atraumatic.  No lip or tongue swelling.   Eyes: Conjunctivae are normal. Right eye exhibits no discharge. Left eye exhibits no discharge. No scleral icterus.  Cardiovascular: Normal rate.   Pulmonary/Chest: Effort normal and breath sounds normal. No respiratory distress. She has no wheezes. She has no rales. She exhibits no tenderness.  Neurological: She is alert and oriented to person, place, and time.  Coordination normal.  Skin: Skin is warm and dry. Rash noted. No petechiae and no purpura noted. Rash is urticarial ( urticarial rash to bilateral upper extremities.). Rash is not nodular, not pustular and not vesicular. She is not diaphoretic. No erythema. No pallor.  Psychiatric: She has a normal mood and affect. Her behavior is normal.  Nursing note and vitals reviewed.   ED Course  Procedures (including critical care time) Labs Review Labs Reviewed - No data to display  Imaging Review No results found. I have personally reviewed and evaluated these images and lab results as part of my medical decision-making.   EKG Interpretation None      MDM   Final diagnoses:  Allergic reaction, initial encounter  Hives   Otherwise healthy 30 y.o F presents for a worsening urticarial rash after an insect bite yesterday. Pt is in NAD. No tongue or lip swelling. No difficulty breathing. Hives present on bilateral upper extremities. Very pruritic. No sign of cellulitis. Pt given IV benadryl, decadron, pepcid and fluids in ED.  Upon re-exam pts rash has significantly improved. LUE is now without a rash. RUE hives are much improved. WIll monitor pt in ED for 2 hours.   Upon additional re-assessment, pt appears well, ready for discharge. Will d/c home with atarax and hydrocortisone cream. Follow up with PCP. Return precautions outlined in patient discharge instructions.      Dondra Spry Hansen, PA-C 10/03/15 1517  Forde Dandy, MD 10/03/15 [phone]

## 2015-10-04 LAB — URINALYSIS, ROUTINE W REFLEX MICROSCOPIC
Bilirubin, UA: NEGATIVE
GLUCOSE, UA: NEGATIVE
Ketones, UA: NEGATIVE
LEUKOCYTES UA: NEGATIVE
Nitrite, UA: NEGATIVE
PH UA: 6 (ref 5.0–7.5)
PROTEIN UA: NEGATIVE
RBC, UA: NEGATIVE
Specific Gravity, UA: 1.023 (ref 1.005–1.030)
Urobilinogen, Ur: 0.2 mg/dL (ref 0.2–1.0)

## 2015-10-04 LAB — CERVICOVAGINAL ANCILLARY ONLY: WET PREP (BD AFFIRM): POSITIVE — AB

## 2015-10-04 MED ORDER — METRONIDAZOLE 500 MG PO TABS: 500.0000 mg | ORAL_TABLET | Freq: Two times a day (BID) | ORAL | Status: DC

## 2015-10-05 LAB — URINE CULTURE

## 2015-10-05 NOTE — Assessment & Plan Note (Addendum)
Assessment: Recurrent BV. She reports she did not take antibiotics the last time she was diagnosed. Doubt gc/chlamydia as she has not had sexual contact since the last time she was tested.  Plan: -Metronidazole 500mg  BID for 7 days. -If she has another episode in the next 9 months, consider long-term maintenance regimen consisting of maintenance metronidazole gel

## 2015-10-05 NOTE — Progress Notes (Signed)
Internal Medicine Clinic Attending  Case discussed with Dr. Krall at the time of the visit.  We reviewed the resident's history and exam and pertinent patient test results.  I agree with the assessment, diagnosis, and plan of care documented in the resident's note.  

## 2015-10-06 ENCOUNTER — Encounter (HOSPITAL_COMMUNITY): Payer: Self-pay | Admitting: *Deleted

## 2015-10-06 ENCOUNTER — Emergency Department (HOSPITAL_COMMUNITY)
Admission: EM | Admit: 2015-10-06 | Discharge: 2015-10-07 | Disposition: A | Discharge status: Home / Self Care | Payer: BLUE CROSS/BLUE SHIELD | Attending: Emergency Medicine | Admitting: Emergency Medicine

## 2015-10-06 DIAGNOSIS — Z8669 Personal history of other diseases of the nervous system and sense organs: Secondary | ICD-10-CM | POA: Diagnosis not present

## 2015-10-06 DIAGNOSIS — Z3202 Encounter for pregnancy test, result negative: Secondary | ICD-10-CM | POA: Insufficient documentation

## 2015-10-06 DIAGNOSIS — Z8659 Personal history of other mental and behavioral disorders: Secondary | ICD-10-CM | POA: Diagnosis not present

## 2015-10-06 DIAGNOSIS — R109 Unspecified abdominal pain: Secondary | ICD-10-CM

## 2015-10-06 DIAGNOSIS — D649 Anemia, unspecified: Secondary | ICD-10-CM | POA: Diagnosis not present

## 2015-10-06 DIAGNOSIS — E0590 Thyrotoxicosis, unspecified without thyrotoxic crisis or storm: Secondary | ICD-10-CM | POA: Insufficient documentation

## 2015-10-06 DIAGNOSIS — K5900 Constipation, unspecified: Secondary | ICD-10-CM | POA: Diagnosis not present

## 2015-10-06 DIAGNOSIS — I10 Essential (primary) hypertension: Secondary | ICD-10-CM | POA: Diagnosis not present

## 2015-10-06 DIAGNOSIS — E039 Hypothyroidism, unspecified: Secondary | ICD-10-CM | POA: Diagnosis not present

## 2015-10-06 DIAGNOSIS — Z792 Long term (current) use of antibiotics: Secondary | ICD-10-CM | POA: Insufficient documentation

## 2015-10-06 DIAGNOSIS — R1030 Lower abdominal pain, unspecified: Secondary | ICD-10-CM | POA: Diagnosis present

## 2015-10-06 DIAGNOSIS — Z86018 Personal history of other benign neoplasm: Secondary | ICD-10-CM | POA: Insufficient documentation

## 2015-10-06 DIAGNOSIS — Z79899 Other long term (current) drug therapy: Secondary | ICD-10-CM | POA: Insufficient documentation

## 2015-10-06 LAB — I-STAT BETA HCG BLOOD, ED (MC, WL, AP ONLY): I-stat hCG, quantitative: <5 m[IU]/mL (ref <5)

## 2015-10-06 LAB — CBC
HEMATOCRIT: 34.7 % — AB (ref 36.0–46.0)
Hemoglobin: 10.5 g/dL — ABNORMAL LOW (ref 12.0–15.0)
MCH: 22.3 pg — ABNORMAL LOW (ref 26.0–34.0)
MCHC: 30.3 g/dL (ref 30.0–36.0)
MCV: 73.8 fL — AB (ref 78.0–100.0)
Platelets: 434 10*3/uL — ABNORMAL HIGH (ref 150–400)
RBC: 4.7 MIL/uL (ref 3.87–5.11)
RDW: 17 % — AB (ref 11.5–15.5)
WBC: 6.4 10*3/uL (ref 4.0–10.5)

## 2015-10-06 LAB — COMPREHENSIVE METABOLIC PANEL
ALT: 11 U/L — ABNORMAL LOW (ref 14–54)
ANION GAP: 10 (ref 5–15)
AST: 17 U/L (ref 15–41)
Albumin: 3.5 g/dL (ref 3.5–5.0)
Alkaline Phosphatase: 51 U/L (ref 38–126)
BILIRUBIN TOTAL: 0.2 mg/dL — AB (ref 0.3–1.2)
BUN: 8 mg/dL (ref 6–20)
CHLORIDE: 103 mmol/L (ref 101–111)
CO2: 26 mmol/L (ref 22–32)
Calcium: 9.5 mg/dL (ref 8.9–10.3)
Creatinine, Ser: 0.83 mg/dL (ref 0.44–1.00)
Glucose, Bld: 109 mg/dL — ABNORMAL HIGH (ref 65–99)
POTASSIUM: 4.1 mmol/L (ref 3.5–5.1)
Sodium: 139 mmol/L (ref 135–145)
TOTAL PROTEIN: 7.1 g/dL (ref 6.5–8.1)

## 2015-10-06 LAB — LIPASE, BLOOD: LIPASE: 25 U/L (ref 11–51)

## 2015-10-06 LAB — URINALYSIS, ROUTINE W REFLEX MICROSCOPIC
Bilirubin Urine: NEGATIVE
Glucose, UA: NEGATIVE mg/dL
Hgb urine dipstick: NEGATIVE
KETONES UR: NEGATIVE mg/dL
NITRITE: NEGATIVE
PH: 6 (ref 5.0–8.0)
PROTEIN: NEGATIVE mg/dL
Specific Gravity, Urine: 1.025 (ref 1.005–1.030)

## 2015-10-06 MED ORDER — FLEET ENEMA 7-19 GM/118ML RE ENEM
1.0000 | ENEMA | Freq: Once | RECTAL | Status: AC
  Administered 2015-10-06: 1 via RECTAL
  Filled 2015-10-06: qty 1

## 2015-10-06 MED ORDER — DICYCLOMINE HCL 10 MG PO CAPS
10.0000 mg | ORAL_CAPSULE | Freq: Once | ORAL | Status: AC
  Administered 2015-10-06: 10 mg via ORAL
  Filled 2015-10-06: qty 1

## 2015-10-06 NOTE — ED Notes (Signed)
Pt now on bedside commode

## 2015-10-06 NOTE — ED Notes (Signed)
The pt is c/o abd cramps for 4 days  With pain going into both her legs lmp 2 days ago.  She has also been constipated

## 2015-10-06 NOTE — ED Provider Notes (Signed)
CSN: [id number]     Arrival date & time 10/06/15  2156 History   First MD Initiated Contact with Patient 10/06/15 2229     Chief Complaint  Patient presents with  . Abdominal Cramping     (Consider location/radiation/quality/duration/timing/severity/associated sxs/prior Treatment) HPI Comments: Patient presents today with a chief complaint of abdominal cramping.  She states that she has been having intermittent cramping across her lower abdomen for the past 4-5 days.  She reports associated constipation and states that her last BM was this afternoon.  She reports that her BM was small, hard, and that she had to strain with having the BM.  She has not taken any medication prior to arrival.  She states that she was seen by her PCP two days ago for the constipation.  She reports that she had a pelvic exam done at that time and was diagnosed with BV.  She reports that she is currently taking the Flagyl for this.  She does report whitish colored vaginal discharge.  She denies nausea, vomiting, diarrhea, fever, chills, or urinary symptoms.  She states that her LMP was around 09/26/15 and was normal.  She denies any prior abdominal surgeries.  The history is provided by the patient.    Past Medical History  Diagnosis Date  . Thyrotoxicosis   . Atrial tachycardia ([place])   . Dizziness   . Hypothyroid   . Depression   . Anxiety   . Meniere's disease   . Anemia   . Fibroids   . Hypertension    History reviewed. No pertinent past surgical history. Family History  Problem Relation Age of Onset  . Heart failure Maternal Aunt    Social History  Substance Use Topics  . Smoking status: Never Smoker   . Smokeless tobacco: Never Used  . Alcohol Use: No   OB History    Gravida Para Term Preterm AB TAB SAB Ectopic Multiple Living   1    1 1          Review of Systems  All other systems reviewed and are negative.     Allergies  Review of patient's allergies indicates no known  allergies.  Home Medications   Prior to Admission medications   Medication Sig Start Date End Date Taking? Authorizing Provider  atenolol (TENORMIN) 50 MG tablet Take 1 tablet (50 mg total) by mouth daily. 09/11/15  Yes Alexa Sherral Hammers, MD  ferrous sulfate 325 (65 FE) MG tablet Take 1 tablet (325 mg total) by mouth 3 (three) times daily with meals. 06/14/15  Yes Alexa Sherral Hammers, MD  hydrochlorothiazide (HYDRODIURIL) 25 MG tablet Take 1 tablet (25 mg total) by mouth daily. 07/18/15  Yes Alexa Sherral Hammers, MD  levothyroxine (SYNTHROID, LEVOTHROID) 25 MCG tablet Take 1 tablet (25 mcg total) by mouth daily before breakfast. 03/15/15  Yes Alexa Sherral Hammers, MD  metroNIDAZOLE (FLAGYL) 500 MG tablet Take 1 tablet (500 mg total) by mouth 2 (two) times daily. 10/04/15  Yes Milagros Loll, MD  potassium chloride SA (K-DUR,KLOR-CON) 20 MEQ tablet Take 1 tablet (20 mEq total) by mouth daily. 07/18/15  Yes Alexa Sherral Hammers, MD  hydrocortisone cream 1 % Apply to affected area 2 times daily Patient not taking: Reported on 10/06/2015 10/01/15   Samantha Tripp Dowless, PA-C  hydrOXYzine (ATARAX/VISTARIL) 25 MG tablet Take 1 tablet (25 mg total) by mouth every 6 (six) hours. Patient not taking: Reported on 10/06/2015 10/01/15   Samantha Tripp Dowless, PA-C  naproxen (NAPROSYN) 500  MG tablet Take 1 tablet (500 mg total) by mouth 2 (two) times daily. Patient not taking: Reported on 10/06/2015 08/27/15   [facility] Ward, PA-C  polyethylene glycol powder [facility]) powder Take 17 g by mouth daily as needed for moderate constipation. Patient not taking: Reported on 10/06/2015 06/14/15   Alexa Sherral Hammers, MD  sertraline (ZOLOFT) 25 MG tablet Take 1 tablet (25 mg total) by mouth daily. Patient not taking: Reported on 09/29/2015 09/11/15   Alexa Sherral Hammers, MD   BP 132/88 mmHg  Pulse 82  Temp(Src) 97.9 F (36.6 C) (Oral)  Resp 18  SpO2 99%  LMP 09/27/2015 Physical Exam  Constitutional: She appears  well-developed and well-nourished.  HENT:  Head: Normocephalic and atraumatic.  Mouth/Throat: Oropharynx is clear and moist.  Neck: Normal range of motion. Neck supple.  Cardiovascular: Normal rate, regular rhythm and normal heart sounds.   Pulmonary/Chest: Effort normal and breath sounds normal.  Abdominal: Soft. Bowel sounds are normal. She exhibits no distension and no mass. There is tenderness in the right lower quadrant, suprapubic area and left lower quadrant. There is no rebound and no guarding.  Musculoskeletal: Normal range of motion.  Neurological: She is alert.  Skin: Skin is warm and dry.  Psychiatric: She has a normal mood and affect.  Nursing note and vitals reviewed.   ED Course  Procedures (including critical care time) Labs Review Labs Reviewed  CBC - Abnormal; Notable for the following:    Hemoglobin 10.5 (*)    HCT 34.7 (*)    MCV 73.8 (*)    MCH 22.3 (*)    RDW 17.0 (*)    Platelets 434 (*)    All other components within normal limits  LIPASE, BLOOD  COMPREHENSIVE METABOLIC PANEL  URINALYSIS, ROUTINE W REFLEX MICROSCOPIC (NOT AT [facility])    Imaging Review No results found. I have personally reviewed and evaluated these images and lab results as part of my medical decision-making.   EKG Interpretation None     11:54 PM Patient had a large BM and states that pain has now improved at this time. MDM   Final diagnoses:  None   Patient presents today with abdominal cramping and constipation.  No vomiting.  No history of abdominal surgeries.  Therefore, doubt SBO.  Patient given enema in the ED and had a large BM.  She reports significant improvement of pain after BM.  Labs today unremarkable.  UA negative for infection.  Feel that the patient is stable for discharge.  Return precautions given.    Hyman Bible, PA-C 10/07/15 [id number]  Carmin Muskrat, MD 10/07/15 [phone]

## 2015-10-06 NOTE — ED Notes (Signed)
Pt is aware she needs a urine sample. She does not have to go at this moment.

## 2015-10-06 NOTE — ED Notes (Signed)
Pt given enema, bedside commode placed in room with patient. Pt notified to hit call light when urge to use the bathroom comes

## 2015-10-07 LAB — URINE MICROSCOPIC-ADD ON

## 2015-10-07 MED ORDER — DOCUSATE SODIUM 100 MG PO CAPS: 100.0000 mg | ORAL_CAPSULE | Freq: Two times a day (BID) | ORAL | Status: DC

## 2015-10-07 MED ORDER — POLYETHYLENE GLYCOL 3350 17 GM/SCOOP PO POWD: 17.0000 g | Freq: Every day | ORAL | Status: DC

## 2015-10-12 ENCOUNTER — Ambulatory Visit (INDEPENDENT_AMBULATORY_CARE_PROVIDER_SITE_OTHER): Payer: BLUE CROSS/BLUE SHIELD | Admitting: Cardiology

## 2015-10-12 ENCOUNTER — Ambulatory Visit: Payer: BLUE CROSS/BLUE SHIELD | Admitting: Cardiology

## 2015-10-12 ENCOUNTER — Encounter: Payer: Self-pay | Admitting: Cardiology

## 2015-10-12 VITALS — BP 112/68 | HR 86 | Ht 63.0 in | Wt 207.6 lb

## 2015-10-12 DIAGNOSIS — I471 Supraventricular tachycardia: Secondary | ICD-10-CM | POA: Diagnosis not present

## 2015-10-12 NOTE — Progress Notes (Signed)
Electrophysiology Office Note   Date:  10/12/2015   ID:  [name], [name] [birth year], MRN [id number]  PCP:  Osa Craver, MD  Cardiologist:  Skeet Latch Primary Electrophysiologist: Constance Haw, MD    No chief complaint on file.    History of Present Illness: Alyssa Hall is a 30 y.o. female who presents today for electrophysiology evaluation.   She presents for evaluation of atrial tachycardia.  She initially presented to cardiology clinic in September with one month of palpitations.  Holter at that time revealed sinus arrhythmia with APCs and she was put on diltiazem.  Atenolol was added by her PCP in January, but she has continued to have palpitations.  She also has symptoms of SOB, lightheadedness, and dizziness.  She does have significant anxiety and stress as her family relationship is not good at the moment.  Today, she denies symptoms of chest pain, orthopnea, PND, lower extremity edema, claudication, dizziness, presyncope, syncope, bleeding, or neurologic sequela. The patient is tolerating medications without difficulties. She says that she has been on atenolol after seeing Dr. Oval Linsey which is made her feel much better. Her heart rate is lower and she has really no major complaints. She says that her palpitations are much improved.   Past Medical History  Diagnosis Date  . Thyrotoxicosis   . Atrial tachycardia ([place])   . Dizziness   . Hypothyroid   . Depression   . Anxiety   . Meniere's disease   . Anemia   . Fibroids   . Hypertension    No past surgical history on file.   Current Outpatient Prescriptions  Medication Sig Dispense Refill  . atenolol (TENORMIN) 50 MG tablet Take 1 tablet (50 mg total) by mouth daily. 30 tablet 11  . docusate sodium (COLACE) 100 MG capsule Take 1 capsule (100 mg total) by mouth every 12 (twelve) hours. 60 capsule 0  . ferrous sulfate 325 (65 FE) MG tablet Take 1 tablet (325 mg total) by mouth 3 (three) times daily  with meals. 90 tablet 3  . hydrochlorothiazide (HYDRODIURIL) 25 MG tablet Take 1 tablet (25 mg total) by mouth daily. 30 tablet 11  . hydrocortisone cream 1 % Apply to affected area 2 times daily (Patient not taking: Reported on 10/06/2015) 15 g 0  . hydrOXYzine (ATARAX/VISTARIL) 25 MG tablet Take 1 tablet (25 mg total) by mouth every 6 (six) hours. (Patient not taking: Reported on 10/06/2015) 12 tablet 0  . levothyroxine (SYNTHROID, LEVOTHROID) 25 MCG tablet Take 1 tablet (25 mcg total) by mouth daily before breakfast. 30 tablet 11  . metroNIDAZOLE (FLAGYL) 500 MG tablet Take 1 tablet (500 mg total) by mouth 2 (two) times daily. 14 tablet 0  . naproxen (NAPROSYN) 500 MG tablet Take 1 tablet (500 mg total) by mouth 2 (two) times daily. (Patient not taking: Reported on 10/06/2015) 30 tablet 0  . polyethylene glycol powder (GLYCOLAX/MIRALAX) powder Take 17 g by mouth daily. 255 g 0  . potassium chloride SA (K-DUR,KLOR-CON) 20 MEQ tablet Take 1 tablet (20 mEq total) by mouth daily. 30 tablet 3  . sertraline (ZOLOFT) 25 MG tablet Take 1 tablet (25 mg total) by mouth daily. (Patient not taking: Reported on 09/29/2015) 30 tablet 11   No current facility-administered medications for this visit.    Allergies:   Review of patient's allergies indicates no known allergies.   Social History:  The patient  reports that she has never smoked. She has never used smokeless tobacco. She reports  that she does not drink alcohol or use illicit drugs.   Family History:  The patient's family history includes Heart failure in her maternal aunt.    ROS:  Please see the history of present illness.   Otherwise, review of systems is positive for palpitations, visual issues, abdominal pain, dizziness, anxiety, headaches.   All other systems are reviewed and negative.    PHYSICAL EXAM: VS:  LMP 09/27/2015 , BMI There is no weight on file to calculate BMI. GEN: Well nourished, well developed, in no acute distress HEENT:  normal Neck: no JVD, carotid bruits, or masses Cardiac: RRR; no murmurs, rubs, or gallops,no edema  Respiratory:  clear to auscultation bilaterally, normal work of breathing GI: soft, nontender, nondistended, + BS MS: no deformity or atrophy Skin: warm and dry Neuro:  Strength and sensation are intact Psych: euthymic mood, full affect  EKG:  EKG is ordered today. The ekg ordered today shows ectopic atrial rhythm, rate 86  Recent Labs: 09/11/2015: TSH 1.180 10/06/2015: ALT 11*; BUN 8; Creatinine, Ser 0.83; Hemoglobin 10.5*; Platelets 434*; Potassium 4.1; Sodium 139    Lipid Panel     Component Value Date/Time   CHOL 156 09/11/2015 1625   TRIG 57 09/11/2015 1625   HDL 55 09/11/2015 1625   CHOLHDL 2.8 09/11/2015 1625   LDLCALC 90 09/11/2015 1625     Wt Readings from Last 3 Encounters:  10/03/15 208 lb 9.6 oz (94.62 kg)  09/30/15 206 lb (93.441 kg)  09/29/15 208 lb (94.348 kg)      Other studies Reviewed: Additional studies/ records that were reviewed today include: TTE 07/2014  Review of the above records today demonstrates:  - Left ventricle: The cavity size was normal. Wall thickness was normal. Systolic function was normal. The estimated ejection fraction was in the range of 55% to 60%. Wall motion was normal; there were no regional wall motion abnormalities. Due to tachycardia, there was fusion of early and atrial contributions to ventricular filling.  24 Hour Holter Monitor 04/2015  Quality: Fair. Baseline artifact. Predominant rhythm: sinus rhythms Other rhythms: multiple episodes of sinus arrhythmia, one episode of ectopic atrial rhythm at 75 bpm,  Average heart rate: 78 bpm Max heart rate: 125 bpm Min heart rate: 65 bpm Pauses >2.5 seconds: 0 Ventricular ectopics: 0 Patient did not submit a symptom diary  ASSESSMENT AND PLAN:  1.  Atrial tachycardia: Tachycardia appears to be coming from the low atrial septum possibly posterior. I have  discussed with her the options of ablation of this and at this time she is not interested in procedures. She would be at risk for damage to the conduction system, as it appears that her tachycardia is on the septum. She is feeling better on the atenolol and therefore we'll continue that medication at this time. I'll see her back in 3 months and make changes then if needed. She might benefit from other medications such as antiarrhythmics or either ivabradine.    Current medicines are reviewed at length with the patient today.   The patient does not have concerns regarding her medicines.  The following changes were made today:  none  Labs/ tests ordered today include:  No orders of the defined types were placed in this encounter.     Disposition:   FU with Dania Marsan  3 months  Signed, Aysen Shieh Meredith Leeds, MD  10/12/2015 7:29 AM     [facility] [address] [place] [address] [phone] (office) [phone] (fax)

## 2015-10-12 NOTE — Patient Instructions (Signed)
Medication Instructions:  Your physician recommends that you continue on your current medications as directed. Please refer to the Current Medication list given to you today.  Labwork: None ordered  Testing/Procedures: None ordered  Follow-Up: Your physician recommends that you schedule a follow-up appointment in: 3 months with Dr. Justice Britain  If you need a refill on your cardiac medications before your next appointment, please call your pharmacy.  Thank you for choosing [facility]!!   Trinidad Curet, RN [phone]

## 2015-10-22 ENCOUNTER — Encounter: Payer: Self-pay | Admitting: Physical Therapy

## 2015-10-22 NOTE — Therapy (Signed)
[place] [address] [place], [place], [address] Phone: [phone]   Fax:  [phone]  Patient Details  Name: Alyssa Hall MRN: [id number] Date of Birth: May 16, 1986 Referring Provider:  No ref. provider found  Encounter Date: 10/22/2015  Pt has not attended PT since 04-30-15;  Attended 5/9 sessions - Pt was to discuss ENT referral for management of Meniere's disease with PCP and return to PT; Unable to assess status/ progress towards goals due to pt not returning to PT.          Alda Lea, PT 10/22/2015, 11:31 AM  [facility] [address] [place], [place], [address] Phone: [phone]   Fax:  [phone]

## 2015-11-16 ENCOUNTER — Encounter (HOSPITAL_COMMUNITY): Payer: Self-pay | Admitting: Emergency Medicine

## 2015-11-16 ENCOUNTER — Emergency Department (HOSPITAL_COMMUNITY)
Admission: EM | Admit: 2015-11-16 | Discharge: 2015-11-16 | Disposition: A | Discharge status: Home / Self Care | Payer: BLUE CROSS/BLUE SHIELD | Attending: Emergency Medicine | Admitting: Emergency Medicine

## 2015-11-16 DIAGNOSIS — E039 Hypothyroidism, unspecified: Secondary | ICD-10-CM | POA: Insufficient documentation

## 2015-11-16 DIAGNOSIS — Z8669 Personal history of other diseases of the nervous system and sense organs: Secondary | ICD-10-CM | POA: Insufficient documentation

## 2015-11-16 DIAGNOSIS — R42 Dizziness and giddiness: Secondary | ICD-10-CM | POA: Insufficient documentation

## 2015-11-16 DIAGNOSIS — F419 Anxiety disorder, unspecified: Secondary | ICD-10-CM | POA: Insufficient documentation

## 2015-11-16 DIAGNOSIS — Z3202 Encounter for pregnancy test, result negative: Secondary | ICD-10-CM | POA: Insufficient documentation

## 2015-11-16 DIAGNOSIS — Z79899 Other long term (current) drug therapy: Secondary | ICD-10-CM | POA: Insufficient documentation

## 2015-11-16 DIAGNOSIS — D649 Anemia, unspecified: Secondary | ICD-10-CM | POA: Insufficient documentation

## 2015-11-16 DIAGNOSIS — I10 Essential (primary) hypertension: Secondary | ICD-10-CM | POA: Insufficient documentation

## 2015-11-16 DIAGNOSIS — Z86018 Personal history of other benign neoplasm: Secondary | ICD-10-CM | POA: Insufficient documentation

## 2015-11-16 LAB — CBC WITH DIFFERENTIAL/PLATELET
Basophils Absolute: 0 10*3/uL (ref 0.0–0.1)
Basophils Relative: 0 %
Eosinophils Absolute: 0.1 10*3/uL (ref 0.0–0.7)
Eosinophils Relative: 2 %
HCT: 35.2 % — ABNORMAL LOW (ref 36.0–46.0)
Hemoglobin: 10.4 g/dL — ABNORMAL LOW (ref 12.0–15.0)
Lymphocytes Relative: 32 %
Lymphs Abs: 1.8 10*3/uL (ref 0.7–4.0)
MCH: 21.7 pg — ABNORMAL LOW (ref 26.0–34.0)
MCHC: 29.5 g/dL — ABNORMAL LOW (ref 30.0–36.0)
MCV: 73.3 fL — ABNORMAL LOW (ref 78.0–100.0)
Monocytes Absolute: 0.5 10*3/uL (ref 0.1–1.0)
Monocytes Relative: 8 %
Neutro Abs: 3.3 10*3/uL (ref 1.7–7.7)
Neutrophils Relative %: 58 %
Platelets: 410 10*3/uL — ABNORMAL HIGH (ref 150–400)
RBC: 4.8 MIL/uL (ref 3.87–5.11)
RDW: 16.3 % — ABNORMAL HIGH (ref 11.5–15.5)
WBC: 5.7 10*3/uL (ref 4.0–10.5)

## 2015-11-16 LAB — BASIC METABOLIC PANEL
Anion gap: 9 (ref 5–15)
BUN: 10 mg/dL (ref 6–20)
CO2: 25 mmol/L (ref 22–32)
Calcium: 8.9 mg/dL (ref 8.9–10.3)
Chloride: 104 mmol/L (ref 101–111)
Creatinine, Ser: 0.7 mg/dL (ref 0.44–1.00)
GFR calc Af Amer: >60 mL/min (ref >60)
GFR calc non Af Amer: >60 mL/min (ref >60)
Glucose, Bld: 81 mg/dL (ref 65–99)
Potassium: 3.9 mmol/L (ref 3.5–5.1)
Sodium: 138 mmol/L (ref 135–145)

## 2015-11-16 LAB — PREGNANCY, URINE: Preg Test, Ur: NEGATIVE

## 2015-11-16 MED ORDER — MECLIZINE HCL 12.5 MG PO TABS: 12.5000 mg | ORAL_TABLET | Freq: Three times a day (TID) | ORAL | Status: DC | PRN

## 2015-11-16 MED ORDER — PROMETHAZINE HCL 25 MG PO TABS
25.0000 mg | ORAL_TABLET | Freq: Once | ORAL | Status: DC
  Filled 2015-11-16: qty 1

## 2015-11-16 MED ORDER — MECLIZINE HCL 25 MG PO TABS
25.0000 mg | ORAL_TABLET | Freq: Once | ORAL | Status: AC
  Administered 2015-11-16: 25 mg via ORAL
  Filled 2015-11-16: qty 1

## 2015-11-16 MED ORDER — PROMETHAZINE HCL 25 MG PO TABS: 25.0000 mg | ORAL_TABLET | Freq: Four times a day (QID) | ORAL | Status: DC | PRN

## 2015-11-16 NOTE — ED Notes (Signed)
Pt attempted to give urine sample, stated unable to urinate at this time

## 2015-11-16 NOTE — ED Notes (Signed)
Patient aware that a urine sample is needed, patient will notify staff when able to provide

## 2015-11-16 NOTE — Discharge Instructions (Signed)
Dizziness Dizziness is a common problem. It is a feeling of unsteadiness or light-headedness. You may feel like you are about to faint. Dizziness can lead to injury if you stumble or fall. Anyone can become dizzy, but dizziness is more common in older adults. This condition can be caused by a number of things, including medicines, dehydration, or illness. HOME CARE INSTRUCTIONS Taking these steps may help with your condition: Eating and Drinking  Drink enough fluid to keep your urine clear or pale yellow. This helps to keep you from becoming dehydrated. Try to drink more clear fluids, such as water.  Do not drink alcohol.  Limit your caffeine intake if directed by your health care provider.  Limit your salt intake if directed by your health care provider. Activity  Avoid making quick movements.  Rise slowly from chairs and steady yourself until you feel okay.  In the morning, first sit up on the side of the bed. When you feel okay, stand slowly while you hold onto something until you know that your balance is fine.  Move your legs often if you need to stand in one place for a long time. Tighten and relax your muscles in your legs while you are standing.  Do not drive or operate heavy machinery if you feel dizzy.  Avoid bending down if you feel dizzy. Place items in your home so that they are easy for you to reach without leaning over. Lifestyle  Do not use any tobacco products, including cigarettes, chewing tobacco, or electronic cigarettes. If you need help quitting, ask your health care provider.  Try to reduce your stress level, such as with yoga or meditation. Talk with your health care provider if you need help. General Instructions  Watch your dizziness for any changes.  Take medicines only as directed by your health care provider. Talk with your health care provider if you think that your dizziness is caused by a medicine that you are taking.  Tell a friend or a family  member that you are feeling dizzy. If he or she notices any changes in your behavior, have this person call your health care provider.  Keep all follow-up visits as directed by your health care provider. This is important. SEEK MEDICAL CARE IF:  Your dizziness does not go away.  Your dizziness or light-headedness gets worse.  You feel nauseous.  You have reduced hearing.  You have new symptoms.  You are unsteady on your feet or you feel like the room is spinning. SEEK IMMEDIATE MEDICAL CARE IF:  You vomit or have diarrhea and are unable to eat or drink anything.  You have problems talking, walking, swallowing, or using your arms, hands, or legs.  You feel generally weak.  You are not thinking clearly or you have trouble forming sentences. It may take a friend or family member to notice this.  You have chest pain, abdominal pain, shortness of breath, or sweating.  Your vision changes.  You notice any bleeding.  You have a headache.  You have neck pain or a stiff neck.  You have a fever.   This information is not intended to replace advice given to you by your health care provider. Make sure you discuss any questions you have with your health care provider.   Document Released: 02/18/2001 Document Revised: 01/09/2015 Document Reviewed: 08/21/2014 Elsevier Interactive Patient Education 2016 [employer].  Vertigo Vertigo means that you feel like you are moving when you are not. Vertigo can also make  you feel like things around you are moving when they are not. This feeling can come and go at any time. Vertigo often goes away on its own. HOME CARE  Avoid making fast movements.  Avoid driving.  Avoid using heavy machinery.  Avoid doing any task or activity that might cause danger to you or other people if you would have a vertigo attack while you are doing it.  Sit down right away if you feel dizzy or have trouble with your balance.  Take over-the-counter and  prescription medicines only as told by your doctor.  Follow instructions from your doctor about which positions or movements you should avoid.  Drink enough fluid to keep your pee (urine) clear or pale yellow.  Keep all follow-up visits as told by your doctor. This is important. GET HELP IF:  Medicine does not help your vertigo.  You have a fever.  Your problems get worse or you have new symptoms.  Your family or friends see changes in your behavior.  You feel sick to your stomach (nauseous) or you throw up (vomit).  You have a "pins and needles" feeling or you are numb in part of your body. GET HELP RIGHT AWAY IF:  You have trouble moving or talking.  You are always dizzy.  You pass out (faint).  You get very bad headaches.  You feel weak or have trouble using your hands, arms, or legs.  You have changes in your hearing.  You have changes in your seeing (vision).  You get a stiff neck.  Bright light starts to bother you.   This information is not intended to replace advice given to you by your health care provider. Make sure you discuss any questions you have with your health care provider.   Document Released: 06/03/2008 Document Revised: 05/16/2015 Document Reviewed: 12/18/2014 Elsevier Interactive Patient Education [employer].

## 2015-11-16 NOTE — ED Notes (Signed)
Per EMS, states she got up from toilet and got dizzy-has a history of vertigo but is noncompliant with her meclozine-has not eaten today

## 2015-11-19 NOTE — ED Provider Notes (Signed)
CSN: [id number]     Arrival date & time 11/16/15  1012 History   First MD Initiated Contact with Patient 11/16/15 1252     Chief Complaint  Patient presents with  . Dizziness     (Consider location/radiation/quality/duration/timing/severity/associated sxs/prior Treatment) HPI   30 year old female with vertigo. She has a past history the same. Today, she got up to use the toilet. She is getting up from seated positions. Feel dizzy. She denies sensation that she may pass out but rather that the room was spinning. Symptoms reminiscent of prior vertigo. Improvement of the breasts. Worse with movement. Previously diagnosed with Mnire's. She denies any tinnitus or ear fullness currently. No fevers or chills. No headache. No acute visual changes. No intervention prior to arrival.  Past Medical History  Diagnosis Date  . Thyrotoxicosis   . Atrial tachycardia ([place])   . Dizziness   . Hypothyroid   . Depression   . Anxiety   . Meniere's disease   . Anemia   . Fibroids   . Hypertension    History reviewed. No pertinent past surgical history. Family History  Problem Relation Age of Onset  . Heart failure Maternal Aunt    Social History  Substance Use Topics  . Smoking status: Never Smoker   . Smokeless tobacco: Never Used  . Alcohol Use: No   OB History    Gravida Para Term Preterm AB TAB SAB Ectopic Multiple Living   1    1 1          Review of Systems  All systems reviewed and negative, other than as noted in HPI.   Allergies  Review of patient's allergies indicates no known allergies.  Home Medications   Prior to Admission medications   Medication Sig Start Date End Date Taking? Authorizing Provider  atenolol (TENORMIN) 50 MG tablet Take 1 tablet (50 mg total) by mouth daily. 09/11/15  Yes Alexa Angela Burke, MD  ferrous sulfate 325 (65 FE) MG tablet Take 1 tablet (325 mg total) by mouth 3 (three) times daily with meals. Patient taking differently: Take 325 mg by mouth daily  with breakfast.  06/14/15  Yes Alexa Angela Burke, MD  hydrochlorothiazide (HYDRODIURIL) 25 MG tablet Take 1 tablet (25 mg total) by mouth daily. 07/18/15  Yes Alexa Angela Burke, MD  levothyroxine (SYNTHROID, LEVOTHROID) 25 MCG tablet Take 1 tablet (25 mcg total) by mouth daily before breakfast. 03/15/15  Yes Alexa Angela Burke, MD  potassium chloride SA (K-DUR,KLOR-CON) 20 MEQ tablet Take 1 tablet (20 mEq total) by mouth daily. 07/18/15  Yes Alexa Angela Burke, MD  meclizine (ANTIVERT) 12.5 MG tablet Take 1 tablet (12.5 mg total) by mouth 3 (three) times daily as needed for dizziness. 11/16/15   Virgel Manifold, MD  metroNIDAZOLE (FLAGYL) 500 MG tablet Take 1 tablet (500 mg total) by mouth 2 (two) times daily. Patient not taking: Reported on 11/16/2015 10/04/15   Milagros Loll, MD  promethazine (PHENERGAN) 25 MG tablet Take 1 tablet (25 mg total) by mouth every 6 (six) hours as needed for nausea or vomiting. 11/16/15   Virgel Manifold, MD   BP 119/89 mmHg  Pulse 85  Temp(Src) 97.6 F (36.4 C) (Oral)  Resp 16  SpO2 100%  LMP 11/16/2015 Physical Exam  Constitutional: She appears well-developed and well-nourished. No distress.  HENT:  Head: Normocephalic and atraumatic.  tympanic membranes and external auditory canals clear bilaterally.  Eyes: Conjunctivae are normal. Right eye exhibits no discharge. Left eye exhibits no discharge.  Neck: Neck supple.  Cardiovascular: Normal rate, regular rhythm and normal heart sounds.  Exam reveals no gallop and no friction rub.   No murmur heard. Pulmonary/Chest: Effort normal and breath sounds normal. No respiratory distress.  Abdominal: Soft. She exhibits no distension. There is no tenderness.  Musculoskeletal: She exhibits no edema or tenderness.  Neurological: She is alert.  Skin: Skin is warm and dry.  Psychiatric: She has a normal mood and affect. Her behavior is normal. Thought content normal.  Nursing note and vitals reviewed.   ED Course  Procedures (including  critical care time) Labs Review Labs Reviewed  CBC WITH DIFFERENTIAL/PLATELET - Abnormal; Notable for the following:    Hemoglobin 10.4 (*)    HCT 35.2 (*)    MCV 73.3 (*)    MCH 21.7 (*)    MCHC 29.5 (*)    RDW 16.3 (*)    Platelets 410 (*)    All other components within normal limits  BASIC METABOLIC PANEL  PREGNANCY, URINE    Imaging Review No results found. I have personally reviewed and evaluated these images and lab results as part of my medical decision-making.   EKG Interpretation   Date/Time:  Friday November 16 2015 13:59:54 EST Ventricular Rate:  93 PR Interval:  193 QRS Duration: 101 QT Interval:  359 QTC Calculation: 446 R Axis:   45 Text Interpretation:  Sinus or ectopic atrial rhythm ED PHYSICIAN  INTERPRETATION AVAILABLE IN [facility] Confirmed by TEST, Record  ([id number]) on 11/17/2015 10:09:05 AM      MDM   Final diagnoses:  Vertigo   30 year old female with symptoms consistent with vertigo. She has a history of same. Nonfocal neuro exam. Plan symptomatic treatment.   Virgel Manifold, MD 11/19/15 [phone]

## 2016-01-03 ENCOUNTER — Emergency Department (HOSPITAL_COMMUNITY)
Admission: EM | Admit: 2016-01-03 | Discharge: 2016-01-03 | Disposition: A | Discharge status: Home / Self Care | Payer: BLUE CROSS/BLUE SHIELD | Attending: Emergency Medicine | Admitting: Emergency Medicine

## 2016-01-03 ENCOUNTER — Emergency Department (HOSPITAL_COMMUNITY): Payer: BLUE CROSS/BLUE SHIELD

## 2016-01-03 DIAGNOSIS — R42 Dizziness and giddiness: Secondary | ICD-10-CM | POA: Insufficient documentation

## 2016-01-03 DIAGNOSIS — I10 Essential (primary) hypertension: Secondary | ICD-10-CM | POA: Insufficient documentation

## 2016-01-03 DIAGNOSIS — R0602 Shortness of breath: Secondary | ICD-10-CM | POA: Insufficient documentation

## 2016-01-03 DIAGNOSIS — Z3202 Encounter for pregnancy test, result negative: Secondary | ICD-10-CM | POA: Insufficient documentation

## 2016-01-03 DIAGNOSIS — R002 Palpitations: Secondary | ICD-10-CM

## 2016-01-03 DIAGNOSIS — Z86018 Personal history of other benign neoplasm: Secondary | ICD-10-CM | POA: Insufficient documentation

## 2016-01-03 DIAGNOSIS — R079 Chest pain, unspecified: Secondary | ICD-10-CM | POA: Insufficient documentation

## 2016-01-03 DIAGNOSIS — F419 Anxiety disorder, unspecified: Secondary | ICD-10-CM | POA: Insufficient documentation

## 2016-01-03 DIAGNOSIS — Z79899 Other long term (current) drug therapy: Secondary | ICD-10-CM | POA: Insufficient documentation

## 2016-01-03 DIAGNOSIS — Z8669 Personal history of other diseases of the nervous system and sense organs: Secondary | ICD-10-CM | POA: Insufficient documentation

## 2016-01-03 DIAGNOSIS — E039 Hypothyroidism, unspecified: Secondary | ICD-10-CM | POA: Insufficient documentation

## 2016-01-03 DIAGNOSIS — D649 Anemia, unspecified: Secondary | ICD-10-CM | POA: Insufficient documentation

## 2016-01-03 LAB — CBC
HCT: 32.2 % — ABNORMAL LOW (ref 36.0–46.0)
Hemoglobin: 9.6 g/dL — ABNORMAL LOW (ref 12.0–15.0)
MCH: 20.5 pg — ABNORMAL LOW (ref 26.0–34.0)
MCHC: 29.8 g/dL — ABNORMAL LOW (ref 30.0–36.0)
MCV: 68.7 fL — ABNORMAL LOW (ref 78.0–100.0)
PLATELETS: 387 10*3/uL (ref 150–400)
RBC: 4.69 MIL/uL (ref 3.87–5.11)
RDW: 16.1 % — ABNORMAL HIGH (ref 11.5–15.5)
WBC: 5 10*3/uL (ref 4.0–10.5)

## 2016-01-03 LAB — TSH: TSH: 2.024 u[IU]/mL (ref 0.350–4.500)

## 2016-01-03 LAB — I-STAT TROPONIN, ED: Troponin i, poc: 0 ng/mL (ref 0.00–0.08)

## 2016-01-03 LAB — BASIC METABOLIC PANEL
ANION GAP: 11 (ref 5–15)
BUN: 10 mg/dL (ref 6–20)
CO2: 21 mmol/L — AB (ref 22–32)
Calcium: 9 mg/dL (ref 8.9–10.3)
Chloride: 106 mmol/L (ref 101–111)
Creatinine, Ser: 0.71 mg/dL (ref 0.44–1.00)
GFR calc Af Amer: >60 mL/min (ref >60)
GFR calc non Af Amer: >60 mL/min (ref >60)
GLUCOSE: 113 mg/dL — AB (ref 65–99)
POTASSIUM: 4.3 mmol/L (ref 3.5–5.1)
Sodium: 138 mmol/L (ref 135–145)

## 2016-01-03 LAB — I-STAT BETA HCG BLOOD, ED (MC, WL, AP ONLY)

## 2016-01-03 NOTE — Discharge Instructions (Signed)
You have been seen today for palpitations. Your imaging and lab tests showed no new abnormalities. Follow up with your cardiologist as soon as possible on this matter. Follow up with PCP as needed. Return to ED should symptoms worsen.

## 2016-01-03 NOTE — ED Notes (Signed)
Pt verbalized understanding of d/c instructions, prescriptions, and follow-up care. No further questions/concerns, VSS, ambulatory w/ steady gait (refused wheelchair) 

## 2016-01-03 NOTE — ED Notes (Signed)
Pt reports palpitations beginning last week. Pt has cardiologist for arrhythmia. Pt reports that these palpitations feel different from prev palpitations. Pt c/o chest tightness, dizziness, and shortness of breath.

## 2016-01-03 NOTE — ED Provider Notes (Signed)
CSN: [id number]     Arrival date & time 01/03/16  0755 History   First MD Initiated Contact with Patient 01/03/16 0801     Chief Complaint  Patient presents with  . Palpitations  . Chest Pain  . Shortness of Breath     (Consider location/radiation/quality/duration/timing/severity/associated sxs/prior Treatment) HPI  Alyssa Hall is a 30 y.o. female, with a history of atrial tachycardia, hypertension, and hypothyroidism, presenting to the ED with intermittent palpitations for the last week. Patient states that these palpitations occur when moving from lying to sitting or from sitting to standing. Patient states that she has had this issue multiple times in the past. Patient also endorses dizziness, chest tightness, and shortness of breath during these episodes. Patient was evaluated by cardiologist Dr. Curt Bears in February 2017 for palpitations. Patient wore a Holter monitor for 30 days, which showed episodes of sinus arrhythmia and atrial tachycardia. Patient had a TTE performed in 2015 without abnormalities. Patient states that she has been taking her medications as prescribed. Patient denies current chest pain, shortness of breath, nausea/vomiting, fever/chills, cough, or any other complaints.    Past Medical History  Diagnosis Date  . Thyrotoxicosis   . Atrial tachycardia ([place])   . Dizziness   . Hypothyroid   . Depression   . Anxiety   . Meniere's disease   . Anemia   . Fibroids   . Hypertension    No past surgical history on file. Family History  Problem Relation Age of Onset  . Heart failure Maternal Aunt    Social History  Substance Use Topics  . Smoking status: Never Smoker   . Smokeless tobacco: Never Used  . Alcohol Use: No   OB History    Gravida Para Term Preterm AB TAB SAB Ectopic Multiple Living   1    1 1          Review of Systems  Constitutional: Negative for fever and chills.  Respiratory: Positive for chest tightness (resolved) and shortness of breath  (resolved). Negative for cough.   Cardiovascular: Positive for palpitations.  Gastrointestinal: Negative for nausea and vomiting.  Neurological: Positive for dizziness (resolved).  All other systems reviewed and are negative.     Allergies  Review of patient's allergies indicates no known allergies.  Home Medications   Prior to Admission medications   Medication Sig Start Date End Date Taking? Authorizing Provider  atenolol (TENORMIN) 50 MG tablet Take 1 tablet (50 mg total) by mouth daily. 09/11/15  Yes Alexa Angela Burke, MD  ferrous sulfate 325 (65 FE) MG tablet Take 1 tablet (325 mg total) by mouth 3 (three) times daily with meals. Patient taking differently: Take 325 mg by mouth daily with breakfast.  06/14/15  Yes Alexa Angela Burke, MD  hydrochlorothiazide (HYDRODIURIL) 25 MG tablet Take 1 tablet (25 mg total) by mouth daily. 07/18/15  Yes Alexa Angela Burke, MD  levothyroxine (SYNTHROID, LEVOTHROID) 25 MCG tablet Take 1 tablet (25 mcg total) by mouth daily before breakfast. 03/15/15  Yes Alexa Angela Burke, MD  meclizine (ANTIVERT) 12.5 MG tablet Take 1 tablet (12.5 mg total) by mouth 3 (three) times daily as needed for dizziness. 11/16/15  Yes Virgel Manifold, MD  metroNIDAZOLE (FLAGYL) 500 MG tablet Take 1 tablet (500 mg total) by mouth 2 (two) times daily. Patient not taking: Reported on 11/16/2015 10/04/15   Milagros Loll, MD  potassium chloride SA (K-DUR,KLOR-CON) 20 MEQ tablet Take 1 tablet (20 mEq total) by mouth daily. Patient not taking:  Reported on 01/03/2016 07/18/15   Alexa Angela Burke, MD  promethazine (PHENERGAN) 25 MG tablet Take 1 tablet (25 mg total) by mouth every 6 (six) hours as needed for nausea or vomiting. 11/16/15   Virgel Manifold, MD   BP 114/62 mmHg  Pulse 88  Temp(Src) 97.1 F (36.2 C) (Oral)  Resp 16  Ht 5\' 4"  (1.626 m)  Wt 95.255 kg  BMI 36.03 kg/m2  SpO2 100%  LMP 01/02/2016 Physical Exam  Constitutional: She appears well-developed and well-nourished. No distress.  HENT:   Head: Normocephalic and atraumatic.  Eyes: Conjunctivae are normal. Pupils are equal, round, and reactive to light.  Neck: Neck supple.  Cardiovascular: Normal rate, regular rhythm, normal heart sounds and intact distal pulses.   Pulmonary/Chest: Effort normal and breath sounds normal. No respiratory distress.  Abdominal: Soft. There is no tenderness. There is no guarding.  Musculoskeletal: She exhibits no edema or tenderness.  Lymphadenopathy:    She has no cervical adenopathy.  Neurological: She is alert.  Skin: Skin is warm and dry. She is not diaphoretic.  Psychiatric: She has a normal mood and affect. Her behavior is normal.  Nursing note and vitals reviewed.   ED Course  Procedures (including critical care time) Labs Review Labs Reviewed  BASIC METABOLIC PANEL - Abnormal; Notable for the following:    CO2 21 (*)    Glucose, Bld 113 (*)    All other components within normal limits  CBC - Abnormal; Notable for the following:    Hemoglobin 9.6 (*)    HCT 32.2 (*)    MCV 68.7 (*)    MCH 20.5 (*)    MCHC 29.8 (*)    RDW 16.1 (*)    All other components within normal limits  TSH  I-STAT TROPOININ, ED  I-STAT BETA HCG BLOOD, ED ([facility], [facility], [facility] ONLY)   HEMOGLOBIN  Date Value Ref Range Status  01/03/2016 9.6* 12.0 - 15.0 g/dL Final  11/16/2015 10.4* 12.0 - 15.0 g/dL Final  10/06/2015 10.5* 12.0 - 15.0 g/dL Final  08/03/2015 10.9* 12.0 - 15.0 g/dL Final      Imaging Review Dg Chest 2 View  01/03/2016  CLINICAL DATA:  Cardiac palpitations for 1 week EXAM: CHEST  2 VIEW COMPARISON:  02/08/2015 FINDINGS: The heart size and mediastinal contours are within normal limits. Both lungs are clear. The visualized skeletal structures are unremarkable. IMPRESSION: No active cardiopulmonary disease. Electronically Signed   By: Inez Catalina M.D.   On: 01/03/2016 08:36   I have personally reviewed and evaluated these images and lab results as part of my medical decision-making.   EKG  Interpretation   Date/Time:  Thursday January 03 2016 08:03:21 EDT Ventricular Rate:  96 PR Interval:  157 QRS Duration: 108 QT Interval:  382 QTC Calculation: 483 R Axis:   63 Text Interpretation:  Sinus rhythm Borderline T abnormalities, anterior  leads Borderline prolonged QT interval Baseline wander in lead(s) III No  significant change since last tracing Confirmed by [facility] MD, Corene Cornea  [phone]) on 01/03/2016 8:11:47 AM       Orthostatic VS for the past 24 hrs:  BP- Lying Pulse- Lying BP- Sitting Pulse- Sitting BP- Standing at 0 minutes Pulse- Standing at 0 minutes  01/03/16 0912 115/78 mmHg 87 109/75 mmHg 89 114/62 mmHg 93     MDM   Final diagnoses:  Palpitations    Pollyann Annoh presents with intermittent, but recurrent palpitations beginning last week.  Findings and plan of care discussed  with Merrily Pew, MD.   This patient's presentation is consistent with positional tachycardia or recurrence of her atrial tachycardia. Low suspicion for ACS. Chart review reveals that the patient has been seen multiple times for palpitations. Orthostatics are negative for significant pulse changes. Patient's labs are consistent with previous values. No further workup indicated at this time. Patient to follow-up with her cardiologist on this matter. Return precautions discussed. Patient voiced understanding of these instructions, agrees to the plan, and is comfortable with discharge.  Filed Vitals:   01/03/16 0811 01/03/16 0815 01/03/16 0915  BP:  130/88 114/62  Pulse:  95 88  Temp:  97.1 F (36.2 C)   TempSrc:  Oral   Resp:  14 16  Height: 5\' 4"  (1.626 m)    Weight: 95.255 kg    SpO2:  100% 100%     Lorayne Bender, PA-C 01/03/16 0947  Merrily Pew, MD 01/03/16 1650

## 2016-01-03 NOTE — ED Notes (Signed)
Pt ambulatory w/ steady gait to restroom. 

## 2016-01-13 NOTE — Progress Notes (Signed)
This encounter was created in error - please disregard.

## 2016-01-14 ENCOUNTER — Encounter: Payer: BLUE CROSS/BLUE SHIELD | Admitting: Cardiology

## 2016-01-14 ENCOUNTER — Other Ambulatory Visit: Payer: Self-pay | Admitting: Internal Medicine

## 2016-01-15 ENCOUNTER — Encounter: Payer: Self-pay | Admitting: Cardiology

## 2016-01-16 ENCOUNTER — Encounter: Payer: Self-pay | Admitting: Internal Medicine

## 2016-01-16 ENCOUNTER — Ambulatory Visit (INDEPENDENT_AMBULATORY_CARE_PROVIDER_SITE_OTHER): Payer: BLUE CROSS/BLUE SHIELD | Admitting: Internal Medicine

## 2016-01-16 VITALS — BP 125/76 | HR 114 | Temp 98.8°F | Ht 63.0 in | Wt 206.4 lb

## 2016-01-16 DIAGNOSIS — I10 Essential (primary) hypertension: Secondary | ICD-10-CM | POA: Diagnosis not present

## 2016-01-16 DIAGNOSIS — D509 Iron deficiency anemia, unspecified: Secondary | ICD-10-CM

## 2016-01-16 DIAGNOSIS — N946 Dysmenorrhea, unspecified: Secondary | ICD-10-CM

## 2016-01-16 DIAGNOSIS — I471 Supraventricular tachycardia: Secondary | ICD-10-CM | POA: Diagnosis not present

## 2016-01-16 DIAGNOSIS — E039 Hypothyroidism, unspecified: Secondary | ICD-10-CM

## 2016-01-16 DIAGNOSIS — R0981 Nasal congestion: Secondary | ICD-10-CM | POA: Insufficient documentation

## 2016-01-16 MED ORDER — FLUTICASONE PROPIONATE 50 MCG/ACT NA SUSP: 2.0000 | Freq: Every day | NASAL | Status: DC

## 2016-01-16 NOTE — Patient Instructions (Signed)
WE WILL CHECK IRON TODAY AND LET YOU KNOW THE RESULTS.  TAKE FLONASE FOR YOUR NASAL CONGESTION.  CONTINUE ALL OTHER MEDICATIONS AS PRESCRIBED.  RETURN IN 6 MONTHS.

## 2016-01-16 NOTE — Assessment & Plan Note (Signed)
Denies associated symptoms. TSH normal at 2.02 at 4/27. Well controlled on Synthroid 25 mcg daily.  Plan: -Continue Synthroid 25 mcg daily

## 2016-01-16 NOTE — Assessment & Plan Note (Signed)
Patient reports sinus pressure and congestion with left sided ear fullness for the past 2 days. She has not tried anything for her symptoms. She is febrile. Nasal turbinates are edematous and erythematous on exam.   Plan: -Flonase 2 sprays each nostril QD

## 2016-01-16 NOTE — Assessment & Plan Note (Signed)
BP Readings from Last 3 Encounters:  01/16/16 125/76  01/03/16 129/60  11/16/15 119/89    Lab Results  Component Value Date   NA 138 01/03/2016   K 4.3 01/03/2016   CREATININE 0.71 01/03/2016    Assessment: Blood pressure control:  Well controlled Progress toward BP goal:   Stable Comments: On Atenolol 50 mg daily  Plan: Medications:  continue current medications

## 2016-01-16 NOTE — Assessment & Plan Note (Signed)
Patient was recently seen by Cardiology who feels the origin of her tachycardia is the low atrial posterior septum. Patient is not interested in ablation and would be at risk for conduction damage with intervention. Plan is continue atenolol 50 mg daily. She is tachycardic today, but has not taken her atenolol.  Plan: -Continue atenolol 50 mg daily

## 2016-01-16 NOTE — Assessment & Plan Note (Signed)
Patient has a history of iron deficiency anemia secondary to menorrhagia. She reports she has not been taking her iron pills and is trying to supplement with iron rich foods.   Plan: -Check iron  -Continue ferrous sulfate based on results

## 2016-01-16 NOTE — Progress Notes (Signed)
Subjective:    [name], female    DOB: Mar 15, 1986, 30 y.o.   MRN: [id number]  HPI Alyssa Hall is a 30 y.o. female with PMHx of atrial ectopic tachycardia, HTN, hypothyroidism, and iron-deficiency anemia who presents to the clinic for follow up for iron-deficiency anemia. Please see A&P for the status of the patient's chronic medical problems.   Past Medical History  Diagnosis Date  . Thyrotoxicosis   . Atrial tachycardia ([place])   . Dizziness   . Hypothyroid   . Depression   . Anxiety   . Meniere's disease   . Anemia   . Fibroids   . Hypertension     Outpatient Encounter Prescriptions as of 01/16/2016  Medication Sig Note  . atenolol (TENORMIN) 50 MG tablet Take 1 tablet (50 mg total) by mouth daily.   . ferrous sulfate 325 (65 FE) MG tablet Take 1 tablet (325 mg total) by mouth 3 (three) times daily with meals. (Patient taking differently: Take 325 mg by mouth daily with breakfast. )   . fluticasone (FLONASE) 50 MCG/ACT nasal spray Place 2 sprays into both nostrils daily.   . hydrochlorothiazide (HYDRODIURIL) 25 MG tablet Take 1 tablet (25 mg total) by mouth daily. 11/16/2015: Patient is suppose to be taking but hasn't because she doesn't believe she needs it   . levothyroxine (SYNTHROID, LEVOTHROID) 25 MCG tablet Take 1 tablet (25 mcg total) by mouth daily before breakfast.   . meclizine (ANTIVERT) 12.5 MG tablet Take 1 tablet (12.5 mg total) by mouth 3 (three) times daily as needed for dizziness.   . potassium chloride SA (K-DUR,KLOR-CON) 20 MEQ tablet Take 1 tablet (20 mEq total) by mouth daily. (Patient not taking: Reported on 01/03/2016) 11/16/2015: Patient is suppose to be taking but hasn't because she doesn't believe she needs it   . promethazine (PHENERGAN) 25 MG tablet Take 1 tablet (25 mg total) by mouth every 6 (six) hours as needed for nausea or vomiting.    No facility-administered encounter medications on file as of 01/16/2016.    Family History    Problem Relation Age of Onset  . Heart failure Maternal Aunt     Social History   Social History  . Marital Status: Single    Spouse Name: N/A  . Number of Children: N/A  . Years of Education: N/A   Occupational History  . Not on file.   Social History Main Topics  . Smoking status: Never Smoker   . Smokeless tobacco: Never Used  . Alcohol Use: No  . Drug Use: No  . Sexual Activity: Not Currently    Birth Control/ Protection: None   Other Topics Concern  . Not on file   Social History Narrative   Uninsured.   Lives in [place].         Review of Systems General: Denies fatigue, change in appetite and diaphoresis. HEENT: Admits to nasal congestion and left ear fullness.   Respiratory: Denies SOB, cough, DOE, chest tightness.   Cardiovascular: Denies chest pain and palpitations.  Gastrointestinal: Denies nausea, vomiting, diarrhea, constipation Endocrine: Denies hot or cold intolerance. Neurological: Denies dizziness, headaches, weakness, lightheadedness Psychiatric/Behavioral: Denies anxiety or depression.     Objective:   Physical Exam Filed Vitals:   01/16/16 1406  BP: 125/76  Pulse: 114  Temp: 98.8 F (37.1 C)  TempSrc: Oral  Height: 5\' 3"  (1.6 m)  Weight: 206 lb 6.4 oz (93.622 kg)  SpO2: 100%   General: Vital signs reviewed.  Patient is well-developed and well-nourished, in no acute distress and cooperative with exam.  HEENT: Erythematous, edematous nasal turbinates, normal posterior oropharynx, normal tympanic membranes bilaterally. Cardiovascular: Tachycardic, regular rhythm, S1 normal, S2 normal, no murmurs, gallops, or rubs. Pulmonary/Chest: Clear to auscultation bilaterally, no wheezes, rales, or rhonchi. Abdominal: Soft, non-tender, non-distended, BS + Extremities: No lower extremity edema bilaterally Skin: Warm, dry and intact.  Psychiatric: Normal mood and affect. speech and behavior is normal. Cognition and memory are grossly normal.       Assessment & Plan:   Please see problem based assessment and plan.

## 2016-01-17 LAB — IRON AND TIBC
IRON SATURATION: 4 % — AB (ref 15–55)
IRON: 19 ug/dL — AB (ref 27–159)
Total Iron Binding Capacity: 446 ug/dL (ref 250–450)
UIBC: 427 ug/dL — AB (ref 131–425)

## 2016-01-17 NOTE — Progress Notes (Signed)
Case discussed with Dr. Burns soon after the resident saw the patient. We reviewed the resident's history and exam and pertinent patient test results. I agree with the assessment, diagnosis, and plan of care documented in the resident's note. 

## 2016-01-22 ENCOUNTER — Telehealth: Payer: Self-pay | Admitting: Internal Medicine

## 2016-01-22 MED ORDER — FERROUS SULFATE 325 (65 FE) MG PO TABS: 325.0000 mg | ORAL_TABLET | Freq: Every day | ORAL | Status: DC

## 2016-01-22 NOTE — Telephone Encounter (Signed)
Called patient concerning her low iron levels. She has resumed her iron supplementation per my recommendation. She requests a refill.   Martyn Malay, DO PGY-2 Internal Medicine Resident Pager # [phone] 01/22/2016 4:21 PM

## 2016-02-01 ENCOUNTER — Emergency Department (HOSPITAL_COMMUNITY)
Admission: EM | Admit: 2016-02-01 | Discharge: 2016-02-01 | Disposition: A | Discharge status: Home / Self Care | Payer: Self-pay | Attending: Emergency Medicine | Admitting: Emergency Medicine

## 2016-02-01 ENCOUNTER — Encounter (HOSPITAL_COMMUNITY): Payer: Self-pay | Admitting: *Deleted

## 2016-02-01 ENCOUNTER — Emergency Department (HOSPITAL_COMMUNITY): Payer: Self-pay

## 2016-02-01 DIAGNOSIS — R519 Headache, unspecified: Secondary | ICD-10-CM

## 2016-02-01 DIAGNOSIS — F418 Other specified anxiety disorders: Secondary | ICD-10-CM | POA: Insufficient documentation

## 2016-02-01 DIAGNOSIS — R112 Nausea with vomiting, unspecified: Secondary | ICD-10-CM | POA: Insufficient documentation

## 2016-02-01 DIAGNOSIS — I10 Essential (primary) hypertension: Secondary | ICD-10-CM | POA: Insufficient documentation

## 2016-02-01 DIAGNOSIS — Z79899 Other long term (current) drug therapy: Secondary | ICD-10-CM | POA: Insufficient documentation

## 2016-02-01 DIAGNOSIS — R51 Headache: Secondary | ICD-10-CM | POA: Insufficient documentation

## 2016-02-01 LAB — BASIC METABOLIC PANEL
Anion gap: 8 (ref 5–15)
BUN: 8 mg/dL (ref 6–20)
CHLORIDE: 106 mmol/L (ref 101–111)
CO2: 23 mmol/L (ref 22–32)
CREATININE: 0.69 mg/dL (ref 0.44–1.00)
Calcium: 9.4 mg/dL (ref 8.9–10.3)
GFR calc Af Amer: >60 mL/min (ref >60)
GFR calc non Af Amer: >60 mL/min (ref >60)
GLUCOSE: 90 mg/dL (ref 65–99)
Potassium: 4 mmol/L (ref 3.5–5.1)
SODIUM: 137 mmol/L (ref 135–145)

## 2016-02-01 LAB — CBC WITH DIFFERENTIAL/PLATELET
BASOS ABS: 0 10*3/uL (ref 0.0–0.1)
Basophils Relative: 0 %
EOS ABS: 0.2 10*3/uL (ref 0.0–0.7)
Eosinophils Relative: 3 %
HCT: 34.6 % — ABNORMAL LOW (ref 36.0–46.0)
Hemoglobin: 10 g/dL — ABNORMAL LOW (ref 12.0–15.0)
Lymphocytes Relative: 25 %
Lymphs Abs: 1.5 10*3/uL (ref 0.7–4.0)
MCH: 20 pg — ABNORMAL LOW (ref 26.0–34.0)
MCHC: 28.9 g/dL — AB (ref 30.0–36.0)
MCV: 69.1 fL — ABNORMAL LOW (ref 78.0–100.0)
MONO ABS: 0.6 10*3/uL (ref 0.1–1.0)
Monocytes Relative: 10 %
NEUTROS ABS: 3.6 10*3/uL (ref 1.7–7.7)
Neutrophils Relative %: 62 %
PLATELETS: 312 10*3/uL (ref 150–400)
RBC: 5.01 MIL/uL (ref 3.87–5.11)
RDW: 19 % — AB (ref 11.5–15.5)
WBC: 5.9 10*3/uL (ref 4.0–10.5)

## 2016-02-01 LAB — URINE MICROSCOPIC-ADD ON: Bacteria, UA: NONE SEEN

## 2016-02-01 LAB — URINALYSIS, ROUTINE W REFLEX MICROSCOPIC
BILIRUBIN URINE: NEGATIVE
Glucose, UA: NEGATIVE mg/dL
KETONES UR: NEGATIVE mg/dL
Leukocytes, UA: NEGATIVE
NITRITE: NEGATIVE
PH: 6.5 (ref 5.0–8.0)
Protein, ur: NEGATIVE mg/dL
Specific Gravity, Urine: 1.015 (ref 1.005–1.030)

## 2016-02-01 LAB — POC URINE PREG, ED: Preg Test, Ur: NEGATIVE

## 2016-02-01 MED ORDER — SODIUM CHLORIDE 0.9 % IV BOLUS (SEPSIS)
1000.0000 mL | Freq: Once | INTRAVENOUS | Status: AC
  Administered 2016-02-01: 1000 mL via INTRAVENOUS

## 2016-02-01 MED ORDER — MORPHINE SULFATE (PF) 4 MG/ML IV SOLN
4.0000 mg | Freq: Once | INTRAVENOUS | Status: DC
  Filled 2016-02-01: qty 1

## 2016-02-01 MED ORDER — MECLIZINE HCL 12.5 MG PO TABS: 12.5000 mg | ORAL_TABLET | Freq: Three times a day (TID) | ORAL | Status: DC | PRN

## 2016-02-01 MED ORDER — ONDANSETRON HCL 4 MG PO TABS: 4.0000 mg | ORAL_TABLET | Freq: Four times a day (QID) | ORAL | Status: DC

## 2016-02-01 MED ORDER — HYDROCODONE-ACETAMINOPHEN 5-325 MG PO TABS: 1.0000 | ORAL_TABLET | ORAL | Status: DC | PRN

## 2016-02-01 MED ORDER — ONDANSETRON HCL 4 MG/2ML IJ SOLN
4.0000 mg | Freq: Once | INTRAMUSCULAR | Status: AC
  Administered 2016-02-01: 4 mg via INTRAVENOUS
  Filled 2016-02-01: qty 2

## 2016-02-01 NOTE — ED Notes (Signed)
Pt states she is calm enough to go to CT and does not want any meds to calm her down. CT notified.

## 2016-02-01 NOTE — ED Notes (Signed)
Pt in via [facility] c/o HA onset this upon wakening, hx of the same, pt reports hx of vertigo with self report out of Meclizine, pt reports this happens when she is on her cycle & pt reports having menstrual cycle at this time, pt reports dizziness & blurred vision, -facial droop, -slurred speech, pt vomiting upon arrival to ED, A&O x4

## 2016-02-01 NOTE — ED Provider Notes (Signed)
CSN: [id number]     Arrival date & time 02/01/16  0932 History   First MD Initiated Contact with Patient 02/01/16 [phone]     No chief complaint on file.  PT C/O HEADACHE THAT STARTED THIS AM.  PT C/O DIZZINESS AND IS OUT OF HER MECLIZINE.  THE PT HAS HAD N/V.  PT GETS THESE H/A WHEN HER MENSES BEGIN.  SHE HAS NEVER SEEN A NEUROLOGIST FOR THESE HEADACHES.  (Consider location/radiation/quality/duration/timing/severity/associated sxs/prior Treatment) The history is provided by the patient.    Past Medical History  Diagnosis Date  . Thyrotoxicosis   . Atrial tachycardia ([place])   . Dizziness   . Hypothyroid   . Depression   . Anxiety   . Meniere's disease   . Anemia   . Fibroids   . Hypertension    History reviewed. No pertinent past surgical history. Family History  Problem Relation Age of Onset  . Heart failure Maternal Aunt    Social History  Substance Use Topics  . Smoking status: Never Smoker   . Smokeless tobacco: Never Used  . Alcohol Use: No   OB History    Gravida Para Term Preterm AB TAB SAB Ectopic Multiple Living   1    1 1          Review of Systems  Gastrointestinal: Positive for nausea and vomiting.  Neurological: Positive for dizziness and headaches.  All other systems reviewed and are negative.     Allergies  Review of patient's allergies indicates no known allergies.  Home Medications   Prior to Admission medications   Medication Sig Start Date End Date Taking? Authorizing Provider  atenolol (TENORMIN) 50 MG tablet Take 1 tablet (50 mg total) by mouth daily. 09/11/15  Yes Alexa Angela Burke, MD  ferrous sulfate 325 (65 FE) MG tablet Take 1 tablet (325 mg total) by mouth daily with breakfast. 01/22/16  Yes Alexa Angela Burke, MD  fluticasone (FLONASE) 50 MCG/ACT nasal spray Place 2 sprays into both nostrils daily. 01/16/16  Yes Alexa Angela Burke, MD  levothyroxine (SYNTHROID, LEVOTHROID) 25 MCG tablet Take 1 tablet (25 mcg total) by mouth daily before breakfast. 03/15/15   Yes Alexa Angela Burke, MD  HYDROcodone-acetaminophen (NORCO/VICODIN) 5-325 MG tablet Take 1 tablet by mouth every 4 (four) hours as needed for severe pain. 02/01/16   Isla Pence, MD  meclizine (ANTIVERT) 12.5 MG tablet Take 1 tablet (12.5 mg total) by mouth 3 (three) times daily as needed for dizziness. 02/01/16   Isla Pence, MD  ondansetron (ZOFRAN) 4 MG tablet Take 1 tablet (4 mg total) by mouth every 6 (six) hours. 02/01/16   Isla Pence, MD   BP 117/62 mmHg  Pulse 72  Temp(Src) 98.8 F (37.1 C) (Oral)  Resp 14  Ht 5\' 3"  (1.6 m)  Wt 214 lb (97.07 kg)  BMI 37.92 kg/m2  SpO2 100%  LMP 02/01/2016 Physical Exam  Constitutional: She is oriented to person, place, and time. She appears well-developed and well-nourished.  HENT:  Head: Normocephalic and atraumatic.  Right Ear: External ear normal.  Left Ear: External ear normal.  Nose: Nose normal.  Mouth/Throat: Oropharynx is clear and moist.  Eyes: Conjunctivae and EOM are normal. Pupils are equal, round, and reactive to light.  Neck: Normal range of motion. Neck supple.  Cardiovascular: Normal rate, regular rhythm, normal heart sounds and intact distal pulses.   Pulmonary/Chest: Effort normal and breath sounds normal.  Abdominal: Soft. Bowel sounds are normal.  Musculoskeletal: Normal range of motion.  Neurological: She is alert and oriented to person, place, and time.  Skin: Skin is warm and dry.  Psychiatric: She has a normal mood and affect. Her behavior is normal. Judgment and thought content normal.  Nursing note and vitals reviewed.   ED Course  Procedures (including critical care time) Labs Review Labs Reviewed  CBC WITH DIFFERENTIAL/PLATELET - Abnormal; Notable for the following:    Hemoglobin 10.0 (*)    HCT 34.6 (*)    MCV 69.1 (*)    MCH 20.0 (*)    MCHC 28.9 (*)    RDW 19.0 (*)    All other components within normal limits  URINALYSIS, ROUTINE W REFLEX MICROSCOPIC (NOT AT [facility]) - Abnormal; Notable for the  following:    Hgb urine dipstick LARGE (*)    All other components within normal limits  URINE MICROSCOPIC-ADD ON - Abnormal; Notable for the following:    Squamous Epithelial / LPF 0-5 (*)    All other components within normal limits  BASIC METABOLIC PANEL  POC URINE PREG, ED   UA SHOWS BLOOD B/C PT IS ON HER PERIOD.  Imaging Review Ct Head Wo Contrast  02/01/2016  CLINICAL DATA:  Bilateral frontotemporal headaches. EXAM: CT HEAD WITHOUT CONTRAST TECHNIQUE: Contiguous axial images were obtained from the base of the skull through the vertex without intravenous contrast. COMPARISON:  None. FINDINGS: No acute intracranial abnormality. Specifically, no hemorrhage, hydrocephalus, mass lesion, acute infarction, or significant intracranial injury. No acute calvarial abnormality. Dural calcifications noted along the convexities and falx. IMPRESSION: No acute intracranial abnormality. Electronically Signed   By: Rolm Baptise M.D.   On: 02/01/2016 11:38   I have personally reviewed and evaluated these images and lab results as part of my medical decision-making.   EKG Interpretation None      MDM  PT IS FEELING MUCH BETTER.  I SUSPECT SHE HAS MIGRAINES.  SHE IS INSTR TO F/U WITH NEURO AND TO RETURN IF WORSE. Final diagnoses:  Acute nonintractable headache, unspecified headache type        Isla Pence, MD 02/01/16 1152

## 2016-02-01 NOTE — Discharge Instructions (Signed)

## 2016-02-01 NOTE — ED Notes (Signed)
Unable to return Morphine 4 mg in Pyxis d/t pt being discharged, Rich Reining, Charge RN aware, Steward Ros, Rn witnessed Morphine 4 mg being wasted in sharps box

## 2016-02-06 ENCOUNTER — Encounter: Payer: BLUE CROSS/BLUE SHIELD | Admitting: Cardiovascular Disease

## 2016-02-06 NOTE — Progress Notes (Signed)
Cardiology Office Note   Date:  02/06/2016   ID:  Madyx Whynot, DOB 1985/09/13, MRN [id number]  PCP:  Florinda Marker, MD  Cardiologist:   Skeet Latch, MD   No chief complaint on file.     [name] is a 30 y.o. female with HTN, atrial tachycardia, and hypothyroidism who presents for follow up on her palpitations.    History of Present Illness: Alyssa Hall was first evaluated 05/2015 with palpitations. She had a Holter that revealed sinus arrhythmia and one PAC but no other abnormalities. She was treated on atenolol and then digoxin without improvement in her symptoms.  The episodes were associated with shortness of breath, lightheadedness, and dizziness. They frequently we occurred in the setting of stressful situations.  She was subsequently started on Zoloft in October 2016 but did not start taking until November. She also received lorazepam and was referred for counselling.  She was seen in the emergency department after having a panic attack while driving her car. At the time she felt dizzy and had numbness and tingling in her hand. She left without being seen but then returned on 09/05/59 with palpitations. Her EKG at that time revealed atrial tachycardia with a rate of 106 bpm.  She followed up with her primary care physician on January 1, at which time atenolol 50 mg daily was started.  Her TSH was within normal limits.  She has been feeling well since I saw her.  She reports that her anxiety is better controlled.  However, she continues to have palpitations.  Earlier this month she had a bad episode of palpitations and could feel her heart beating in her ear.  At the time she had a cold and was very congested.  She has also noted shortness of breath.  It usually happens when she is driving.  She feels especially stressed when driving.  Alyssa Hall has started going to the gym.  She notes increased palpitations when exercising and once felt as though she would pass out.   She denies chest pain or pressure.  She has not noted any improvement on atenolol.       Past Medical History  Diagnosis Date  . Thyrotoxicosis   . Atrial tachycardia ([place])   . Dizziness   . Hypothyroid   . Depression   . Anxiety   . Meniere's disease   . Anemia   . Fibroids   . Hypertension     No past surgical history on file.   Current Outpatient Prescriptions  Medication Sig Dispense Refill  . atenolol (TENORMIN) 50 MG tablet Take 1 tablet (50 mg total) by mouth daily. 30 tablet 11  . ferrous sulfate 325 (65 FE) MG tablet Take 1 tablet (325 mg total) by mouth daily with breakfast. 90 tablet 3  . fluticasone (FLONASE) 50 MCG/ACT nasal spray Place 2 sprays into both nostrils daily. 16 g 2  . HYDROcodone-acetaminophen (NORCO/VICODIN) 5-325 MG tablet Take 1 tablet by mouth every 4 (four) hours as needed for severe pain. 6 tablet 0  . levothyroxine (SYNTHROID, LEVOTHROID) 25 MCG tablet Take 1 tablet (25 mcg total) by mouth daily before breakfast. 30 tablet 11  . meclizine (ANTIVERT) 12.5 MG tablet Take 1 tablet (12.5 mg total) by mouth 3 (three) times daily as needed for dizziness. 30 tablet 0  . ondansetron (ZOFRAN) 4 MG tablet Take 1 tablet (4 mg total) by mouth every 6 (six) hours. 12 tablet 0   No current facility-administered  medications for this visit.    Allergies:   Review of patient's allergies indicates no known allergies.    Social History:  The patient  reports that she has never smoked. She has never used smokeless tobacco. She reports that she does not drink alcohol or use illicit drugs.   Family History:  The patient's family history includes Heart failure in her maternal aunt.    ROS:  Please see the history of present illness.   Otherwise, review of systems are positive for none.   All other systems are reviewed and negative.    PHYSICAL EXAM: VS:  LMP 02/01/2016 , BMI There is no weight on file to calculate BMI. GENERAL:  Well appearing HEENT:   Pupils equal round and reactive, fundi not visualized, oral mucosa unremarkable NECK:  No jugular venous distention, waveform within normal limits, carotid upstroke brisk and symmetric, no bruits, no thyromegaly LYMPHATICS:  No cervical adenopathy LUNGS:  Clear to auscultation bilaterally HEART:  Tachycardic.  Regular rhythm.  PMI not displaced or sustained,S1 and S2 within normal limits, no S3, no S4, no clicks, no rubs, no murmurs ABD:  Flat, positive bowel sounds normal in frequency in pitch, no bruits, no rebound, no guarding, no midline pulsatile mass, no hepatomegaly, no splenomegaly EXT:  2 plus pulses throughout, no edema, no cyanosis no clubbing SKIN:  No rashes no nodules NEURO:  Cranial nerves II through XII grossly intact, motor grossly intact throughout PSYCH:  Cognitively intact, oriented to person place and time   EKG:  EKG is ordered today. Ectopic atrial rhythm. Rate 107 bpm.  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  TTE 07/2014: EF 55-60%.  E-A fusion.  Recent Labs: 10/06/2015: ALT 11* 01/03/2016: TSH 2.024 02/01/2016: BUN 8; Creatinine, Ser 0.69; Hemoglobin 10.0*; Platelets 312; Potassium 4.0; Sodium 137    Lipid Panel    Component Value Date/Time   CHOL 156 09/11/2015 1625   TRIG 57 09/11/2015 1625   HDL 55 09/11/2015 1625   CHOLHDL 2.8 09/11/2015 1625   LDLCALC 90 09/11/2015 1625      Wt Readings from Last 3 Encounters:  02/01/16 97.07 kg (214 lb)  01/16/16 93.622 kg (206 lb 6.4 oz)  01/03/16 95.255 kg (210 lb)     Other studies Reviewed: Additional studies/ records that were reviewed today include:  Review of the above records demonstrates:  Please see elsewhere in the note.    24 Hour Holter Monitor 04/27/15:  Quality: Fair. Baseline artifact. Predominant rhythm: sinus rhythms Other rhythms: multiple episodes of sinus arrhythmia, one episode of ectopic atrial rhythm at 75 bpm,  Average heart rate: 78 bpm Max heart rate: 125 bpm Min heart rate:  65 bpm Pauses >2.5 seconds: 0 Ventricular ectopics: 0 Patient did not submit a symptom diary  ASSESSMENT AND PLAN:  # Ectopic atrial rhythm:  Alyssa Hall continues to have episodes of palpitations.  Her anxiety is under much better control, yet she returns to clinic with a heart rate >100.  She consistently has an ectopic rhythm on EKG.  There are no other clear triggers for tachycardia, as her thyroid and electrolytes are normal.  She does not drink caffeine or use OTC decongestants.  Although her Holter did not reveal any significant arrhythmias, I suspect that she is having tachycardia at home that is causing symptoms.  Her heart rate is persistently elevated on ECG on both diltiazem and atenolol.  We discussed the options, including continuing to titrate nodal agents, antiarrhythmics and EP study  with possible ablation.  She is interested in talking with an electrophysiologist about the possibility of ablation.  I will refer her to Dr. Curt Bears for this discussion.  # Hypertension: BP well-controlled on HCTZ and atenolol.    # Anxiety: Much better controlled on Zoloft.  She was cheerful and bright today.  Current medicines are reviewed at length with the patient today.  The patient does not have concerns regarding medicines.  The following changes have been made:  no change  Labs/ tests ordered today include: none  No orders of the defined types were placed in this encounter.     Disposition:   FU with Dr. Jonelle Sidle C. Decherd in 6 months.   Signed, Skeet Latch, MD  02/06/2016 8:18 AM    [place]  This encounter was created in error - please disregard.

## 2016-02-07 ENCOUNTER — Encounter: Payer: Self-pay | Admitting: *Deleted

## 2016-03-10 ENCOUNTER — Other Ambulatory Visit: Payer: Self-pay | Admitting: Internal Medicine

## 2016-03-25 ENCOUNTER — Encounter: Payer: Self-pay | Admitting: Cardiovascular Disease

## 2016-03-25 ENCOUNTER — Ambulatory Visit (INDEPENDENT_AMBULATORY_CARE_PROVIDER_SITE_OTHER): Payer: Self-pay | Admitting: Cardiovascular Disease

## 2016-03-25 ENCOUNTER — Ambulatory Visit: Payer: Self-pay | Admitting: Cardiovascular Disease

## 2016-03-25 VITALS — BP 124/86 | HR 91 | Ht 63.0 in | Wt 211.2 lb

## 2016-03-25 DIAGNOSIS — I471 Supraventricular tachycardia: Secondary | ICD-10-CM

## 2016-03-25 DIAGNOSIS — I10 Essential (primary) hypertension: Secondary | ICD-10-CM

## 2016-03-25 NOTE — Patient Instructions (Signed)

## 2016-03-25 NOTE — Progress Notes (Signed)
Cardiology Office Note   Date:  03/25/2016   ID:  Alyssa Hall, DOB 03-12-1986, MRN [id number]  PCP:  Florinda Marker, MD  Cardiologist:   Skeet Latch, MD   Chief Complaint  Patient presents with  . New Patient (Initial Visit)  . Chest Pain    pt sttes some tightness in her chest, also states she can feel her heart beating in her left ear   . Shortness of Breath    states she gets SOB every now and then       [name] is a 30 y.o. female with HTN, atrial tachycardia, and hypothyroidism who presents for follow up on her palpitations.    History of Present Illness: Alyssa Hall was first evaluated 05/2015 with palpitations. She had a Holter that revealed sinus arrhythmia and one PAC but no other abnormalities. She was treated on atenolol and then digoxin without improvement in her symptoms.  The episodes were associated with shortness of breath, lightheadedness, and dizziness. They frequently we occurred in the setting of stressful situations.  She was subsequently started on Zoloft in October 2016 but did not start taking until November. She also received lorazepam and was referred for counselling.  She was seen in the emergency department after having a panic attack while driving her car. At the time she felt dizzy and had numbness and tingling in her hand. She left without being seen but then returned on 09/05/59 with palpitations. Her EKG at that time revealed atrial tachycardia with a rate of 106 bpm.  She followed up with her primary care physician on January 1, at which time atenolol 50 mg daily was started.  Her TSH was within normal limits.  Alyssa Hall has been doing well.  She saw Dr. Curt Bears on 10/2015 and he felt that she would not be a good candidate for ablation of her atrial tachycardia because the source was in the low atrial septum and posterior. This could cause conduction abnormalities and require pacemaker placement. She has been doing well and notes  that she frequently feels heart beating in her left ear. Her PCP told her this may be due to sinus congestion. It improved somewhat after starting a note nasal spray.  She also notices some chest tightness that occurs with exertion. This only occurs when she is been very tired. She is not sleeping well because she is in school stressed. She denies any lower extremity edema, orthopnea, or PND.  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   Past Medical History  Diagnosis Date  . Thyrotoxicosis   . Atrial tachycardia ([place])   . Dizziness   . Hypothyroid   . Depression   . Anxiety   . Meniere's disease   . Anemia   . Fibroids   . Hypertension     No past surgical history on file.   Current Outpatient Prescriptions  Medication Sig Dispense Refill  . atenolol (TENORMIN) 50 MG tablet Take 1 tablet (50 mg total) by mouth daily. 30 tablet 11  . ferrous sulfate 325 (65 FE) MG tablet Take 1 tablet (325 mg total) by mouth daily with breakfast. 90 tablet 3  . fluticasone (FLONASE) 50 MCG/ACT nasal spray Place 2 sprays into both nostrils daily. 16 g 2  . HYDROcodone-acetaminophen (NORCO/VICODIN) 5-325 MG tablet Take 1 tablet by mouth every 4 (four) hours as needed for severe pain. 6 tablet 0  . levothyroxine (SYNTHROID, LEVOTHROID) 25 MCG tablet  TAKE 1 TABLET(25 MCG) BY MOUTH DAILY BEFORE BREAKFAST 90 tablet 3  . triamterene-hydrochlorothiazide (DYAZIDE) 37.5-25 MG capsule Take 1 capsule by mouth daily.  5   No current facility-administered medications for this visit.    Allergies:   Review of patient's allergies indicates no known allergies.    Social History:  The patient  reports that she has never smoked. She has never used smokeless tobacco. She reports that she does not drink alcohol or use illicit drugs.   Family History:  The patient's family history includes Heart failure in her maternal aunt.    ROS:  Please see the history of present illness.   Otherwise,  review of systems are positive for none.   All other systems are reviewed and negative.    PHYSICAL EXAM: VS:  BP 124/86 mmHg  Pulse 91  Ht 5\' 3"  (1.6 m)  Wt 211 lb 3.2 oz (95.8 kg)  BMI 37.42 kg/m2 , BMI Body mass index is 37.42 kg/(m^2). GENERAL:  Well appearing HEENT:  Pupils equal round and reactive, fundi not visualized, oral mucosa unremarkable NECK:  No jugular venous distention, waveform within normal limits, carotid upstroke brisk and symmetric, no bruits, no thyromegaly LYMPHATICS:  No cervical adenopathy LUNGS:  Clear to auscultation bilaterally HEART:  Tachycardic.  Regular rhythm.  PMI not displaced or sustained,S1 and S2 within normal limits, no S3, no S4, no clicks, no rubs, no murmurs ABD:  Flat, positive bowel sounds normal in frequency in pitch, no bruits, no rebound, no guarding, no midline pulsatile mass, no hepatomegaly, no splenomegaly EXT:  2 plus pulses throughout, no edema, no cyanosis no clubbing SKIN:  No rashes no nodules NEURO:  Cranial nerves II through XII grossly intact, motor grossly intact throughout PSYCH:  Cognitively intact, oriented to person place and time   EKG:  EKG is ordered today. Ectopic atrial rhythm. Rate 91 bpm.  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  TTE 07/2014: EF 55-60%.  E-A fusion.  Recent Labs: 10/06/2015: ALT 11* 01/03/2016: TSH 2.024 02/01/2016: BUN 8; Creatinine, Ser 0.69; Hemoglobin 10.0*; Platelets 312; Potassium 4.0; Sodium 137    Lipid Panel    Component Value Date/Time   CHOL 156 09/11/2015 1625   TRIG 57 09/11/2015 1625   HDL 55 09/11/2015 1625   CHOLHDL 2.8 09/11/2015 1625   LDLCALC 90 09/11/2015 1625      Wt Readings from Last 3 Encounters:  03/25/16 211 lb 3.2 oz (95.8 kg)  02/01/16 214 lb (97.07 kg)  01/16/16 206 lb 6.4 oz (93.622 kg)     Other studies Reviewed: Additional studies/ records that were reviewed today include:  Review of the above records demonstrates:  Please see elsewhere in the  note.    24 Hour Holter Monitor 04/27/15:  Quality: Fair. Baseline artifact. Predominant rhythm: sinus rhythms Other rhythms: multiple episodes of sinus arrhythmia, one episode of ectopic atrial rhythm at 75 bpm,  Average heart rate: 78 bpm Max heart rate: 125 bpm Min heart rate: 65 bpm Pauses >2.5 seconds: 0 Ventricular ectopics: 0 Patient did not submit a symptom diary  ASSESSMENT AND PLAN:  # Ectopic atrial rhythm:  Alyssa Hall continues to have episodes of palpitations.  She currently has an ectopic atrail rhythm.  Symptoms are well-controlled. She is not a good candidate for ablation due to the low septal location. Continue atenolol. Per Dr. Curt Bears she could be considered for ivabradine or antiarrhythmics if her symptoms worsen.  # Hypertension: BP well-controlled on HCTZ and atenolol.  Current medicines are reviewed at length with the patient today.  The patient does not have concerns regarding medicines.  The following changes have been made:  no change  Labs/ tests ordered today include: none  No orders of the defined types were placed in this encounter.     Disposition:   FU with Dr. Jonelle Sidle C. [place] in 6 months.   Signed, Skeet Latch, MD  03/25/2016 6:04 PM    [place]

## 2016-04-07 ENCOUNTER — Ambulatory Visit: Payer: Self-pay

## 2016-04-24 ENCOUNTER — Other Ambulatory Visit: Payer: Self-pay | Admitting: Internal Medicine

## 2016-05-07 ENCOUNTER — Encounter: Payer: Self-pay | Admitting: Internal Medicine

## 2016-05-07 ENCOUNTER — Ambulatory Visit (INDEPENDENT_AMBULATORY_CARE_PROVIDER_SITE_OTHER): Payer: Self-pay | Admitting: Internal Medicine

## 2016-05-07 VITALS — BP 144/83 | HR 111 | Temp 98.4°F | Wt 213.7 lb

## 2016-05-07 DIAGNOSIS — T148XXA Other injury of unspecified body region, initial encounter: Secondary | ICD-10-CM

## 2016-05-07 DIAGNOSIS — N898 Other specified noninflammatory disorders of vagina: Secondary | ICD-10-CM | POA: Insufficient documentation

## 2016-05-07 DIAGNOSIS — D509 Iron deficiency anemia, unspecified: Secondary | ICD-10-CM

## 2016-05-07 DIAGNOSIS — Z0000 Encounter for general adult medical examination without abnormal findings: Secondary | ICD-10-CM

## 2016-05-07 DIAGNOSIS — I471 Supraventricular tachycardia: Secondary | ICD-10-CM

## 2016-05-07 DIAGNOSIS — E039 Hypothyroidism, unspecified: Secondary | ICD-10-CM

## 2016-05-07 DIAGNOSIS — S39012A Strain of muscle, fascia and tendon of lower back, initial encounter: Secondary | ICD-10-CM

## 2016-05-07 DIAGNOSIS — X58XXXA Exposure to other specified factors, initial encounter: Secondary | ICD-10-CM

## 2016-05-07 DIAGNOSIS — I4719 Other supraventricular tachycardia: Secondary | ICD-10-CM

## 2016-05-07 MED ORDER — NAPROXEN 500 MG PO TABS: 500.0000 mg | ORAL_TABLET | Freq: Two times a day (BID) | ORAL | 0 refills | Status: DC

## 2016-05-07 NOTE — Assessment & Plan Note (Signed)
Hemoglobin stable at 10, MCV 69.1 in May 2017. Patient reports compliance with iron supplementation. Last ferritin 14 in 10/16.   Plan: -Continue iron supplementation -Repeat ferritin -If iron replete, consider starting on OCP for control of menorrhagia and eventual discontinuation of iron supplementation

## 2016-05-07 NOTE — Assessment & Plan Note (Signed)
Patient admits to a 3 month history of fishy odor that is stronger during her menstrual cycles. She denies any associated itching, discharge or lesions. She has not been sexually active in the last 3 months. She admits to using perfumed soaps around her vaginal area. Pelvic exam completed and only remarkable for blood.   Plan: -GC/Chlamydia/Wet prep -Likely BV- will treat according to results -Encouraged to use a pH balanced soap such as Summer's Eve

## 2016-05-07 NOTE — Assessment & Plan Note (Signed)
Patient admits to neck and back pain from work and studying. She has not tried anything for the pain. Physical exam shows tense musculature in lower back.   Plan: -Naproxen 500 mg BID WC x 7 days

## 2016-05-07 NOTE — Progress Notes (Signed)
    CC: vaginal odor  HPI: [name] is a 30 y.o. female with PMHx of atrial ectopic tachycardia, HTN, and hypothyroidism who presents to the clinic for acute complaint of vaginal odor. Please see problem based assessment and plan for more information.   Patient is eating and drinking well. She denies chest pain, shortness or breath, nausea or vomiting.   Past Medical History:  Diagnosis Date  . Anemia   . Anxiety   . Atrial tachycardia ([place])   . Depression   . Dizziness   . Fibroids   . Hypertension   . Hypothyroid   . Meniere's disease   . Thyrotoxicosis     Review of Systems: Please see pertinent ROS reviewed in HPI and problem based charting.   Physical Exam: Vitals:   05/07/16 1420  BP: (!) 144/83  Pulse: (!) 111  Temp: 98.4 F (36.9 C)  TempSrc: Oral  SpO2: 99%  Weight: 213 lb 11.2 oz (96.9 kg)   General: Vital signs reviewed.  Patient is well-developed and well-nourished, in no acute distress and cooperative with exam.  Cardiovascular: Tachycardic, regular rhythm, S1 normal, S2 normal, no murmurs, gallops, or rubs. Pulmonary/Chest: Clear to auscultation bilaterally, no wheezes, rales, or rhonchi. Abdominal: Soft, non-tender, non-distended, BS + GU: Pelvic exam: VULVA: normal appearing vulva with no masses, tenderness or lesions, VAGINA: normal appearing vagina with normal color and blood, no lesions, CERVIX: normal appearing cervix without discharge or lesions, WET MOUNT done - results: pending, exam chaperoned by Adline Peals. Extremities: No lower extremity edema bilaterally Skin: Warm, dry and intact. No rashes or erythema. Psychiatric: Normal mood and affect. speech and behavior is normal. Cognition and memory are normal.   Assessment & Plan:  See encounters tab for problem based medical decision making. Patient discussed with Dr. Evette Doffing

## 2016-05-07 NOTE — Assessment & Plan Note (Signed)
Patient is tachycardic to 111 on exam. Patient admits not taking her atenolol in 2 days, but does usually take it. We discussed the possible risks of sustained tachycardia including cardiomyopathy. She understands the risks and agrees to be compliant with her medications.  Plan: -Continue atenolol 50 mg daily

## 2016-05-07 NOTE — Patient Instructions (Signed)
For your back and neck pain: use warm compresses and take naproxen 500 mg twice a day with food for 7 days  For your vaginal odor: We will check for bacterial vaginosis. If positive, we will treat with antibiotics. Try generic version of "Summer's Eve" for washing in your vaginal area.  For your palpitations: Take atenolol 50 mg once a day  For your anemia: Take iron once a day. We will check your ferritin today.  For your Hypothyroidism: continue Synthroid 25 mcg once a day  Follow up in 6 months.

## 2016-05-07 NOTE — Assessment & Plan Note (Signed)
Patient reports compliance with Synthroid 25 mcg daily. Last TSH normal in April 2017.   Plan: -Continue Synthroid 25 mcg daily

## 2016-05-07 NOTE — Assessment & Plan Note (Signed)
Patient declined flu shot Pap normal in 2016

## 2016-05-08 LAB — FERRITIN: Ferritin: 27 ng/mL (ref 15–150)

## 2016-05-08 LAB — CERVICOVAGINAL ANCILLARY ONLY
CHLAMYDIA, DNA PROBE: NEGATIVE
Neisseria Gonorrhea: NEGATIVE

## 2016-05-08 NOTE — Progress Notes (Signed)
Internal Medicine Clinic Attending  Case discussed with Dr. Burns soon after the resident saw the patient.  We reviewed the resident's history and exam and pertinent patient test results.  I agree with the assessment, diagnosis, and plan of care documented in the resident's note. 

## 2016-05-09 LAB — CERVICOVAGINAL ANCILLARY ONLY: Wet Prep (BD Affirm): POSITIVE — AB

## 2016-05-11 ENCOUNTER — Other Ambulatory Visit: Payer: Self-pay | Admitting: Internal Medicine

## 2016-05-11 MED ORDER — METRONIDAZOLE 500 MG PO TABS: 500.0000 mg | ORAL_TABLET | Freq: Two times a day (BID) | ORAL | 0 refills | Status: DC

## 2016-05-26 ENCOUNTER — Ambulatory Visit (INDEPENDENT_AMBULATORY_CARE_PROVIDER_SITE_OTHER): Payer: Self-pay | Admitting: *Deleted

## 2016-05-26 DIAGNOSIS — Z111 Encounter for screening for respiratory tuberculosis: Secondary | ICD-10-CM

## 2016-05-26 DIAGNOSIS — Z23 Encounter for immunization: Secondary | ICD-10-CM

## 2016-05-27 ENCOUNTER — Encounter: Payer: Self-pay | Admitting: Internal Medicine

## 2016-06-03 ENCOUNTER — Ambulatory Visit (INDEPENDENT_AMBULATORY_CARE_PROVIDER_SITE_OTHER): Payer: Self-pay | Admitting: *Deleted

## 2016-06-03 DIAGNOSIS — Z111 Encounter for screening for respiratory tuberculosis: Secondary | ICD-10-CM

## 2016-06-05 LAB — TB SKIN TEST
Induration: 0 mm
TB Skin Test: NEGATIVE

## 2016-08-12 ENCOUNTER — Telehealth: Payer: Self-pay | Admitting: Internal Medicine

## 2016-08-12 MED ORDER — FERROUS SULFATE 325 (65 FE) MG PO TABS: 325.0000 mg | ORAL_TABLET | ORAL | 3 refills | Status: DC

## 2016-08-12 NOTE — Assessment & Plan Note (Addendum)
Hemoglobin was stable at 10, MCV 69.1 in May 2017. Patient reports compliance with iron supplementation. Last ferritin 14 in 10/16. Repeat ferritin low at 27. We continued her iron therapy. Today, we discussed changing her iron administration to every other day for better absorption and GI side effect profile. Once iron replete, I would consider starting on OCP for control of menorrhagia and eventual discontinuation of iron supplementation.  Plan: -Repeat CBC--> unable to add on CBC, will place as future order -Continue iron every other day

## 2016-08-12 NOTE — Progress Notes (Signed)
    CC: Follow up for HTN  HPI: [name] is a 30 y.o. female with PMHx of HTN, Atrial Ectopic Tachycardia, Hypothyroidism who presents to the clinic for follow up for HTN. Please see problem based assessment and plan for more information.   Patient is eating and drinking well. She denies chest pain or shortness of breath.   Hemoglobin was stable at 10, MCV 69.1 in May 2017. Patient reports compliance with iron supplementation. Last ferritin 14 in 10/16. Repeat ferritin low at 27. We continued her iron therapy. Today, we discussed changing her iron administration to every other day for better absorption and GI side effect profile. Once iron replete, I would consider starting on OCP for control of menorrhagia and eventual discontinuation of iron supplementation.   Past Medical History:  Diagnosis Date  . Anemia   . Anxiety   . Atrial tachycardia ([place])   . Depression   . Dizziness   . Fibroids   . Hypertension   . Hypothyroid   . Meniere's disease   . Thyrotoxicosis    Review of Systems: Please see pertinent ROS reviewed in HPI and problem based charting.   Physical Exam: Vitals:   08/13/16 1331  BP: 125/74  Pulse: 98  Temp: 98.2 F (36.8 C)  TempSrc: Oral  SpO2: 100%  Weight: 223 lb 12.8 oz (101.5 kg)  Height: 5\' 3"  (1.6 m)   General: Vital signs reviewed.  Patient is well-developed and well-nourished, in no acute distress and cooperative with exam.  Neck: Supple, trachea midline, no thyromegaly, or carotid bruit present.  Cardiovascular: Tachycardic, regular rhythm, S1 normal, S2 normal, no murmurs, gallops, or rubs. Pulmonary/Chest: Clear to auscultation bilaterally, no wheezes, rales, or rhonchi. Abdominal: Soft, non-tender, non-distended, BS + Musculoskeletal: Tender on palpation of mid-back musculature laterally. No pain on palpation of spine.  Extremities: No lower extremity edema bilaterally,  pulses symmetric and intact bilaterally.  Neurological: Strength  is normal and symmetric bilaterally in hip flexion, extension, knee flexion extension and plantarflexion and dorsiflexion. Sensory intact to light touch bilaterally.  Skin: Warm, dry and intact. No rashes or erythema. Psychiatric: Normal mood and affect. speech and behavior is normal. Cognition and memory are normal.   Assessment & Plan:  See encounters tab for problem based medical decision making. Patient discussed with Dr. Evette Doffing

## 2016-08-13 ENCOUNTER — Ambulatory Visit (INDEPENDENT_AMBULATORY_CARE_PROVIDER_SITE_OTHER): Payer: Self-pay | Admitting: Internal Medicine

## 2016-08-13 ENCOUNTER — Encounter: Payer: Self-pay | Admitting: Internal Medicine

## 2016-08-13 DIAGNOSIS — N946 Dysmenorrhea, unspecified: Secondary | ICD-10-CM

## 2016-08-13 DIAGNOSIS — M545 Low back pain: Secondary | ICD-10-CM

## 2016-08-13 DIAGNOSIS — E509 Vitamin A deficiency, unspecified: Secondary | ICD-10-CM

## 2016-08-13 DIAGNOSIS — D509 Iron deficiency anemia, unspecified: Secondary | ICD-10-CM

## 2016-08-13 DIAGNOSIS — Z79899 Other long term (current) drug therapy: Secondary | ICD-10-CM

## 2016-08-13 DIAGNOSIS — T148XXA Other injury of unspecified body region, initial encounter: Secondary | ICD-10-CM

## 2016-08-13 DIAGNOSIS — E039 Hypothyroidism, unspecified: Secondary | ICD-10-CM

## 2016-08-13 DIAGNOSIS — D500 Iron deficiency anemia secondary to blood loss (chronic): Secondary | ICD-10-CM

## 2016-08-13 MED ORDER — NAPROXEN 500 MG PO TABS
500.0000 mg | ORAL_TABLET | Freq: Two times a day (BID) | ORAL | 0 refills | Status: DC
Start: 2016-08-13 — End: 2017-01-16

## 2016-08-13 NOTE — Patient Instructions (Signed)
For you low back pain, please work on weight loss and continue to exercise and stay active. It is likely due to muscle strain and spasms in your back. Try massages and heating pads to the muscles to relax. We will also start you on Naproxen 500 mg twice a day for pain. You can call me if you'd like to try the muscle relaxers.  We will refer you to Ob/Gyn and order a thyroid test for today.  Please follow up in 6 months.

## 2016-08-13 NOTE — Addendum Note (Signed)
Addended by: Martyn Malay R on: 08/13/2016 03:04 PM   Modules accepted: Orders

## 2016-08-13 NOTE — Assessment & Plan Note (Signed)
Patient requests referral back to Ob/Gyn for follow up.  Plan: -Referral to Ob/Gyn

## 2016-08-13 NOTE — Assessment & Plan Note (Signed)
Patient admits to a 6 month history of mid to low back pain located laterally. Pain is worse with standing for long periods of time. She has tried tylenol with minimal relief. She states the pain is a 5/10 at its worst and never seems to get much worse or better than a 5. She denies any recent injury. She denies radiculopathy, weakness, or bladder or bowel incontinence.   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

## 2016-08-13 NOTE — Assessment & Plan Note (Signed)
Last TSH 2.024. She reports compliance with levothyroxine 2.024. She admits to weight gain and would like her TSH rechecked. Previous weight was 213 in August 2017.  Filed Weights   08/13/16 1331  Weight: 223 lb 12.8 oz (101.5 kg)   Plan: -Continue Levothyroxine  -Check TSH

## 2016-08-14 ENCOUNTER — Telehealth: Payer: Self-pay

## 2016-08-14 LAB — TSH: TSH: 3.82 u[IU]/mL (ref 0.450–4.500)

## 2016-08-14 MED ORDER — HYDROCHLOROTHIAZIDE 12.5 MG PO TABS: 12.5000 mg | ORAL_TABLET | Freq: Every day | ORAL | 11 refills | Status: DC

## 2016-08-14 NOTE — Addendum Note (Signed)
Addended by: Martyn Malay R on: 08/14/2016 04:26 PM   Modules accepted: Orders

## 2016-08-14 NOTE — Telephone Encounter (Signed)
Requesting lab result. Please call back. 

## 2016-08-14 NOTE — Progress Notes (Signed)
Internal Medicine Clinic Attending  Case discussed with Dr. Burns soon after the resident saw the patient.  We reviewed the resident's history and exam and pertinent patient test results.  I agree with the assessment, diagnosis, and plan of care documented in the resident's note. 

## 2016-08-14 NOTE — Telephone Encounter (Signed)
Message sent to Dr. Burns.

## 2016-08-20 NOTE — Telephone Encounter (Signed)
Completed, thank you!

## 2016-08-28 ENCOUNTER — Telehealth: Payer: Self-pay | Admitting: *Deleted

## 2016-08-28 NOTE — Telephone Encounter (Signed)
PATIENT WAS CONTACTED WITH THIS APPOINTMENT FOR GYN : January 12.018 @ 10:20AM / ARRIVE 10:00AM

## 2016-09-17 ENCOUNTER — Other Ambulatory Visit: Payer: Self-pay | Admitting: Internal Medicine

## 2016-09-17 DIAGNOSIS — I4719 Other supraventricular tachycardia: Secondary | ICD-10-CM

## 2016-09-17 DIAGNOSIS — I471 Supraventricular tachycardia: Secondary | ICD-10-CM

## 2016-09-19 ENCOUNTER — Ambulatory Visit: Payer: Self-pay | Admitting: Obstetrics & Gynecology

## 2016-09-23 NOTE — Addendum Note (Signed)
Addended by: Hulan Fray on: 09/23/2016 08:29 PM   Modules accepted: Orders

## 2016-09-27 ENCOUNTER — Emergency Department (HOSPITAL_COMMUNITY): Payer: Self-pay

## 2016-09-27 ENCOUNTER — Emergency Department (HOSPITAL_COMMUNITY)
Admission: EM | Admit: 2016-09-27 | Discharge: 2016-09-28 | Disposition: A | Discharge status: Home / Self Care | Payer: Self-pay | Attending: Emergency Medicine | Admitting: Emergency Medicine

## 2016-09-27 ENCOUNTER — Encounter (HOSPITAL_COMMUNITY): Payer: Self-pay

## 2016-09-27 DIAGNOSIS — I10 Essential (primary) hypertension: Secondary | ICD-10-CM | POA: Insufficient documentation

## 2016-09-27 DIAGNOSIS — R519 Headache, unspecified: Secondary | ICD-10-CM

## 2016-09-27 DIAGNOSIS — E039 Hypothyroidism, unspecified: Secondary | ICD-10-CM | POA: Insufficient documentation

## 2016-09-27 DIAGNOSIS — R51 Headache: Secondary | ICD-10-CM | POA: Insufficient documentation

## 2016-09-27 DIAGNOSIS — Z79899 Other long term (current) drug therapy: Secondary | ICD-10-CM | POA: Insufficient documentation

## 2016-09-27 LAB — POC URINE PREG, ED: PREG TEST UR: NEGATIVE

## 2016-09-27 MED ORDER — METOCLOPRAMIDE HCL 10 MG PO TABS: 10.0000 mg | ORAL_TABLET | Freq: Four times a day (QID) | ORAL | 0 refills | Status: DC | PRN

## 2016-09-27 MED ORDER — KETOROLAC TROMETHAMINE 60 MG/2ML IM SOLN
60.0000 mg | Freq: Once | INTRAMUSCULAR | Status: DC
  Filled 2016-09-27: qty 2

## 2016-09-27 MED ORDER — DIPHENHYDRAMINE HCL 25 MG PO CAPS
50.0000 mg | ORAL_CAPSULE | Freq: Once | ORAL | Status: AC
Start: 2016-09-27 — End: 2016-09-27
  Administered 2016-09-27: 50 mg via ORAL
  Filled 2016-09-27: qty 2

## 2016-09-27 MED ORDER — METOCLOPRAMIDE HCL 5 MG/ML IJ SOLN
10.0000 mg | Freq: Once | INTRAMUSCULAR | Status: AC
  Administered 2016-09-27: 10 mg via INTRAMUSCULAR
  Filled 2016-09-27: qty 2

## 2016-09-27 NOTE — ED Triage Notes (Signed)
Onset 8:15p while shopping sudden blurred vision, sensitive to light, headache, and nausea.  Blurred vision and sensitivity to light has resolved.  Pt has headache in center of forehead.

## 2016-09-27 NOTE — Discharge Instructions (Signed)
Take benadryl with reglan for migraine like headaches. If you were given medicines take as directed.  If you are on coumadin or contraceptives realize their levels and effectiveness is altered by many different medicines.  If you have any reaction (rash, tongues swelling, other) to the medicines stop taking and see a physician.    If your blood pressure was elevated in the ER make sure you follow up for management with a primary doctor or return for chest pain, shortness of breath or stroke symptoms.  Please follow up as directed and return to the ER or see a physician for new or worsening symptoms.  Thank you. Vitals:   09/27/16 2053  BP: 116/83  Pulse: 96  Resp: 23  Temp: 98.4 F (36.9 C)  TempSrc: Oral  SpO2: 100%

## 2016-09-27 NOTE — ED Provider Notes (Signed)
[facility] DEPT Provider Note   CSN: [id number] Arrival date & time: 09/27/16  2042     History   Chief Complaint Chief Complaint  Patient presents with  . Headache  . Nausea    HPI Alyssa Hall is a 31 y.o. female.  Patient presents with headache and blurry vision. Patient was shopping and developed nausea bilateral blurry vision/aura followed by gradually worsening headache. Blurred vision and sensitivity life is improved and completely resolved. Headache located central for head. No neurologic history. Patient denies any current neurologic symptoms. Patient has a history of similar headache in the past no formal diagnosis of migraines. No specific triggers known.      Past Medical History:  Diagnosis Date  . Anemia   . Anxiety   . Atrial tachycardia ([place])   . Depression   . Dizziness   . Fibroids   . Hypertension   . Hypothyroid   . Meniere's disease   . Thyrotoxicosis     Patient Active Problem List   Diagnosis Date Noted  . Muscle strain 03/28/2015  . Atrial ectopic tachycardia ([place]) 12/06/2014  . HTN (hypertension) 10/11/2013  . Healthcare maintenance 05/27/2013  . Meniere's disease 07/12/2012  . Dysmenorrhea 05/05/2012  . Iron deficiency anemia 02/04/2012  . Hypothyroidism (acquired) 01/14/2012  . Generalized anxiety disorder 01/14/2012    History reviewed. No pertinent surgical history.  OB History    Gravida Para Term Preterm AB Living   1       1     SAB TAB Ectopic Multiple Live Births     1             Home Medications    Prior to Admission medications   Medication Sig Start Date End Date Taking? Authorizing Provider  atenolol (TENORMIN) 50 MG tablet TAKE 1 TABLET EVERY DAY 09/17/16   Alexa Angela Burke, MD  ferrous sulfate 325 (65 FE) MG tablet Take 1 tablet (325 mg total) by mouth every other day. 08/12/16   Alexa Angela Burke, MD  fluticasone (FLONASE) 50 MCG/ACT nasal spray Place 2 sprays into both nostrils daily. 01/16/16   Florinda Marker,  MD  hydrochlorothiazide (HYDRODIURIL) 12.5 MG tablet Take 1 tablet (12.5 mg total) by mouth daily. 08/14/16   Alexa Angela Burke, MD  levothyroxine (SYNTHROID, LEVOTHROID) 25 MCG tablet TAKE 1 TABLET(25 MCG) BY MOUTH DAILY BEFORE BREAKFAST 03/11/16   Alexa Angela Burke, MD  metoCLOPramide (REGLAN) 10 MG tablet Take 1 tablet (10 mg total) by mouth every 6 (six) hours as needed for nausea (nausea/headache). 09/27/16   Elnora Morrison, MD  naproxen (NAPROSYN) 500 MG tablet Take 1 tablet (500 mg total) by mouth 2 (two) times daily with a meal. 08/13/16   Florinda Marker, MD    Family History Family History  Problem Relation Age of Onset  . Heart failure Maternal Aunt     Social History Social History  Substance Use Topics  . Smoking status: Never Smoker  . Smokeless tobacco: Never Used  . Alcohol use No     Allergies   Patient has no known allergies.   Review of Systems Review of Systems  Constitutional: Positive for appetite change. Negative for chills and fever.  HENT: Negative for congestion.   Eyes: Negative for visual disturbance.  Respiratory: Negative for shortness of breath.   Cardiovascular: Negative for chest pain.  Gastrointestinal: Positive for nausea. Negative for abdominal pain and vomiting.  Genitourinary: Negative for dysuria and flank pain.  Musculoskeletal: Negative for  back pain, neck pain and neck stiffness.  Skin: Negative for rash.  Neurological: Positive for headaches. Negative for light-headedness.     Physical Exam Updated Vital Signs BP 116/83 (BP Location: Right Arm)   Pulse 96   Temp 98.4 F (36.9 C) (Oral)   Resp 23   SpO2 100%   Physical Exam  Constitutional: She appears well-developed and well-nourished. No distress.  HENT:  Head: Normocephalic and atraumatic.  Eyes: Conjunctivae are normal.  Neck: Neck supple.  Cardiovascular: Normal rate and regular rhythm.   No murmur heard. Pulmonary/Chest: Effort normal and breath sounds normal. No respiratory  distress.  Abdominal: Soft. There is no tenderness.  Musculoskeletal: She exhibits no edema.  Neurological: She is alert. No cranial nerve deficit. GCS eye subscore is 4. GCS verbal subscore is 5. GCS motor subscore is 6.  5+ strength in UE and LE with f/e at major joints. Sensation to palpation intact in UE and LE. CNs 2-12 grossly intact.  EOMFI.  PERRL.   Finger nose and coordination intact bilateral.   Visual fields intact to finger testing. No nystagmus   Skin: Skin is warm and dry.  Psychiatric: She has a normal mood and affect.  Nursing note and vitals reviewed.    ED Treatments / Results  Labs (all labs ordered are listed, but only abnormal results are displayed) Labs Reviewed  POC URINE PREG, ED    EKG  EKG Interpretation None       Radiology No results found.  Procedures Procedures (including critical care time)  Medications Ordered in ED Medications  ketorolac (TORADOL) injection 60 mg (not administered)  metoCLOPramide (REGLAN) injection 10 mg (10 mg Intramuscular Given 09/27/16 2208)  diphenhydrAMINE (BENADRYL) capsule 50 mg (50 mg Oral Given 09/27/16 2208)     Initial Impression / Assessment and Plan / ED Course  I have reviewed the triage vital signs and the nursing notes.  Pertinent labs & imaging results that were available during my care of the patient were reviewed by me and considered in my medical decision making (see chart for details).   Pt presents with HA clinical concern for migraine with aura, similar in the past.  Nurses note states more sudden onset, however pt describes gradual onset.  Plan for migraine cocktail.  Preg neg. CT neg.  Pt improved in ED.  Results and differential diagnosis were discussed with the patient/parent/guardian. Xrays were independently reviewed by myself.  Close follow up outpatient was discussed, comfortable with the plan.   Medications  ketorolac (TORADOL) injection 60 mg (not administered)    metoCLOPramide (REGLAN) injection 10 mg (10 mg Intramuscular Given 09/27/16 2208)  diphenhydrAMINE (BENADRYL) capsule 50 mg (50 mg Oral Given 09/27/16 2208)    Vitals:   09/27/16 2053  BP: 116/83  Pulse: 96  Resp: 23  Temp: 98.4 F (36.9 C)  TempSrc: Oral  SpO2: 100%    Final diagnoses:  Headache, unspecified headache type     Final Clinical Impressions(s) / ED Diagnoses   Final diagnoses:  Headache, unspecified headache type    New Prescriptions New Prescriptions   METOCLOPRAMIDE (REGLAN) 10 MG TABLET    Take 1 tablet (10 mg total) by mouth every 6 (six) hours as needed for nausea (nausea/headache).     Elnora Morrison, MD 09/28/16 [phone]

## 2016-09-28 NOTE — ED Notes (Signed)
Pt understood dc material. NAD Noted. Script given at dc 

## 2016-10-06 ENCOUNTER — Ambulatory Visit: Payer: Self-pay

## 2016-10-23 ENCOUNTER — Other Ambulatory Visit: Payer: Self-pay | Admitting: Internal Medicine

## 2016-11-04 ENCOUNTER — Telehealth: Payer: Self-pay | Admitting: Internal Medicine

## 2016-11-04 NOTE — Telephone Encounter (Signed)
APT. REMINDER CALL, LMTCB °

## 2016-11-05 ENCOUNTER — Ambulatory Visit: Payer: Self-pay

## 2016-12-31 ENCOUNTER — Telehealth: Payer: Self-pay | Admitting: Internal Medicine

## 2016-12-31 ENCOUNTER — Ambulatory Visit: Payer: Self-pay

## 2016-12-31 NOTE — Telephone Encounter (Signed)
APT. REMINDER CALL, LMTCB °

## 2017-01-01 ENCOUNTER — Ambulatory Visit: Payer: Self-pay

## 2017-01-05 ENCOUNTER — Telehealth: Payer: Self-pay | Admitting: Internal Medicine

## 2017-01-05 ENCOUNTER — Ambulatory Visit: Payer: Self-pay

## 2017-01-05 NOTE — Telephone Encounter (Signed)
APT. REMINDER CALL, LMTCB °

## 2017-01-06 ENCOUNTER — Ambulatory Visit (INDEPENDENT_AMBULATORY_CARE_PROVIDER_SITE_OTHER): Payer: Self-pay | Admitting: Internal Medicine

## 2017-01-06 ENCOUNTER — Encounter: Payer: Self-pay | Admitting: Internal Medicine

## 2017-01-06 ENCOUNTER — Ambulatory Visit: Payer: Self-pay

## 2017-01-06 VITALS — BP 91/73 | HR 87 | Temp 98.4°F | Ht 63.0 in | Wt 225.1 lb

## 2017-01-06 DIAGNOSIS — N946 Dysmenorrhea, unspecified: Secondary | ICD-10-CM

## 2017-01-06 DIAGNOSIS — H8109 Meniere's disease, unspecified ear: Secondary | ICD-10-CM

## 2017-01-06 DIAGNOSIS — D259 Leiomyoma of uterus, unspecified: Secondary | ICD-10-CM

## 2017-01-06 DIAGNOSIS — N945 Secondary dysmenorrhea: Secondary | ICD-10-CM

## 2017-01-06 DIAGNOSIS — D500 Iron deficiency anemia secondary to blood loss (chronic): Secondary | ICD-10-CM

## 2017-01-06 DIAGNOSIS — Z79899 Other long term (current) drug therapy: Secondary | ICD-10-CM

## 2017-01-06 DIAGNOSIS — E039 Hypothyroidism, unspecified: Secondary | ICD-10-CM

## 2017-01-06 MED ORDER — MECLIZINE HCL 25 MG PO TABS
25.0000 mg | ORAL_TABLET | Freq: Three times a day (TID) | ORAL | 1 refills | Status: DC | PRN
Start: 2017-01-06 — End: 2017-02-13

## 2017-01-06 NOTE — Assessment & Plan Note (Addendum)
Patient was seen by ENT a few years ago for episodic dizziness and diagnosed with Mnire's disease. She was subsequently placed on HCTZ 12.5 mg daily with improvement in her symptoms. She ran out of this medication a few months ago and is now requesting a refill. Today her blood pressure is 91/73. She is currently asymptomatic. She is also on atenolol 50 mg daily per cardiology for atrial tachycardia. Given her low blood pressure, I am hesitant to restart a diuretic. We will do a trial of prn meclizine for abortive therapy instead. If blood pressure improves, we can consider restarting HCTZ the future. -- Meclizine 25 mg TID prn for persistent dizziness

## 2017-01-06 NOTE — Patient Instructions (Signed)
[name],  It was a pleasure seeing you today. For your meniere's disease, I have prescribed you a medicine called meclizine. You may take this as needed for episode of dizziness that last longer than a couple of minutes. Take 1-2 tablets up to three times a day as needed. If you are not experiencing dizziness, you do not need to take the medication. I will call you with the results of you blood work today. Once you are approved for the orange card, we will put in a referral to gynecology for you. If you have any questions or concerns, call our clinic at [phone] or after hours call [phone] and ask for the internal medicine resident on call. Thank you!  - Dr. Philipp Ovens

## 2017-01-06 NOTE — Progress Notes (Signed)
   CC: Dizziness   HPI:  [name] is a 31 y.o. female with past medical history outlined below here for follow up of her meniere's disease. For the details of today's visit, please refer to the assessment and plan.  Past Medical History:  Diagnosis Date  . Anemia   . Anxiety   . Atrial tachycardia ([place])   . Depression   . Dizziness   . Fibroids   . Hypertension   . Hypothyroid   . Meniere's disease   . Thyrotoxicosis     Review of Systems:  All pertinents listed in HPI, otherwise negative  Physical Exam:  Vitals:   01/06/17 1052  BP: 91/73  Pulse: 87  Temp: 98.4 F (36.9 C)  TempSrc: Oral  SpO2: 100%  Weight: 225 lb 1.6 oz (102.1 kg)  Height: 5\' 3"  (1.6 m)    Constitutional: NAD, appears comfortable Cardiovascular: RRR, no murmurs, rubs, or gallops.  Pulmonary/Chest: CTAB, no wheezes, rales, or rhonchi. Abdominal: Soft, non tender, non distended. +BS.  Extremities: Warm and well perfused.No edema.  Neurological: A&Ox3, CN II - XII grossly intact.  Skin: No rashes or erythema  Psychiatric: Normal mood and affect  Assessment & Plan:   See Encounters Tab for problem based charting.  Patient discussed with Dr. Angelia Mould

## 2017-01-06 NOTE — Assessment & Plan Note (Addendum)
Patient has dysmenorrhea secondary to uterine fibroids. She was previously followed by gynecology but lost insurance coverage. She is in the process of applying for her orange card and requesting referral. Last PAP smear in Oct. 2016 was normal.  -- Refer to gynecology once approved for orange card

## 2017-01-06 NOTE — Assessment & Plan Note (Signed)
Patient is requesting that we recheck her TSH today. TSH was last checked 4 months ago and within normal limits. She is currently prescribed Synthroid 25 g daily. She denies any symptoms of hypo-or hyper thyroidism. Patient reports that her Synthroid has been slowly down titrated over the past few years. She has been stable on her current dose since July 2017. Given that she is doing well and last TSH was normal, I reassured patient that TSH can be checked yearly.  -- Recheck TSH December 2018

## 2017-01-07 LAB — CBC
Hematocrit: 31.5 % — ABNORMAL LOW (ref 34.0–46.6)
Hemoglobin: 9.1 g/dL — ABNORMAL LOW (ref 11.1–15.9)
MCH: 18.8 pg — AB (ref 26.6–33.0)
MCHC: 28.9 g/dL — ABNORMAL LOW (ref 31.5–35.7)
MCV: 65 fL — ABNORMAL LOW (ref 79–97)
PLATELETS: 425 10*3/uL — AB (ref 150–379)
RBC: 4.83 x10E6/uL (ref 3.77–5.28)
RDW: 18.9 % — AB (ref 12.3–15.4)
WBC: 6.1 10*3/uL (ref 3.4–10.8)

## 2017-01-07 NOTE — Progress Notes (Signed)
Internal Medicine Clinic Attending  Case discussed with Dr. Guilloud at the time of the visit.  We reviewed the resident's history and exam and pertinent patient test results.  I agree with the assessment, diagnosis, and plan of care documented in the resident's note.  

## 2017-01-16 ENCOUNTER — Ambulatory Visit (INDEPENDENT_AMBULATORY_CARE_PROVIDER_SITE_OTHER): Payer: Self-pay | Admitting: Cardiovascular Disease

## 2017-01-16 ENCOUNTER — Encounter: Payer: Self-pay | Admitting: Cardiovascular Disease

## 2017-01-16 VITALS — BP 116/74 | HR 92 | Ht 64.0 in | Wt 222.0 lb

## 2017-01-16 DIAGNOSIS — R42 Dizziness and giddiness: Secondary | ICD-10-CM

## 2017-01-16 DIAGNOSIS — I10 Essential (primary) hypertension: Secondary | ICD-10-CM

## 2017-01-16 DIAGNOSIS — R002 Palpitations: Secondary | ICD-10-CM

## 2017-01-16 DIAGNOSIS — I491 Atrial premature depolarization: Secondary | ICD-10-CM

## 2017-01-16 NOTE — Patient Instructions (Signed)
Medication Instructions:  STOP ATENOLOL   Labwork: NONE  Testing/Procedures: NONE  Follow-Up: Your physician recommends that you schedule a follow-up appointment in: 5-6 WEEKS   If you need a refill on your cardiac medications before your next appointment, please call your pharmacy.

## 2017-01-16 NOTE — Progress Notes (Signed)
Cardiology Office Note   Date:  01/16/2017   ID:  Alyssa Hall, DOB 04/01/1986, MRN [id number]  PCP:  Florinda Marker, MD  Cardiologist:   Skeet Latch, MD   Chief Complaint  Patient presents with  . 6 month visit    pt c/o frequent dizziness when she stands up or turns too fast; anxiety is better; cramping/tingling in both calves      [name] is a 31 y.o. female with HTN, atrial tachycardia, and hypothyroidism who presents for follow up on her palpitations.    History of Present Illness: Alyssa Hall was first evaluated 05/2015 with palpitations. She had a Holter that revealed sinus arrhythmia and one PAC but no other abnormalities. She was treated on atenolol and then digoxin without improvement in her symptoms.  The episodes were associated with shortness of breath, lightheadedness, and dizziness. They frequently we occurred in the setting of stressful situations.  She was subsequently started on Zoloft in October 2016 but did not start taking until November. She also received lorazepam and was referred for counselling.  She was seen in the emergency department after having a panic attack while driving her car. At the time she felt dizzy and had numbness and tingling in her hand. She left without being seen but then returned on 09/05/59 with palpitations. Her EKG at that time revealed atrial tachycardia with a rate of 106 bpm.  She followed up with her primary care physician, at which time atenolol 50 mg daily was started.  Her TSH was within normal limits.  Alyssa Hall saw Dr. Curt Bears on 10/2015 and he felt that she would not be a good candidate for ablation of her atrial tachycardia because the source was in the low atrial septum and posterior.  She has been doing well and denies any recent palpitations.  She takes atenolol every other day.  Her main complaint is dizziness when she first stands.  It lasts for 5 minutes and is sometimes associated with palpitations.  She  denies syncope, chest pain or shortness of breath.  She also denies lower extremity edema, orthopnea or PND.  She has been exercising and watching her diet in an attempt to lose weight.  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   Past Medical History:  Diagnosis Date  . Anemia   . Anxiety   . Atrial tachycardia ([place])   . Depression   . Dizziness   . Fibroids   . Hypertension   . Hypothyroid   . Meniere's disease   . Thyrotoxicosis     No past surgical history on file.   Current Outpatient Prescriptions  Medication Sig Dispense Refill  . atenolol (TENORMIN) 50 MG tablet TAKE 1 TABLET EVERY DAY 90 tablet 3  . ferrous sulfate 325 (65 FE) MG tablet Take 1 tablet (325 mg total) by mouth every other day. 45 tablet 3  . levothyroxine (SYNTHROID, LEVOTHROID) 25 MCG tablet TAKE 1 TABLET(25 MCG) BY MOUTH DAILY BEFORE BREAKFAST 90 tablet 3  . meclizine (ANTIVERT) 25 MG tablet Take 1-2 tablets (25-50 mg total) by mouth 3 (three) times daily as needed for dizziness. 30 tablet 1   No current facility-administered medications for this visit.     Allergies:   Patient has no known allergies.    Social History:  The patient  reports that she has never smoked. She has never used smokeless tobacco. She reports that she does  not drink alcohol or use drugs.   Family History:  The patient's family history includes Heart failure in her maternal aunt.    ROS:  Please see the history of present illness.   Otherwise, review of systems are positive for tingling in legs when she sleeps.   All other systems are reviewed and negative.    PHYSICAL EXAM: VS:  BP 116/74 (BP Location: Left Arm, Patient Position: Sitting, Cuff Size: Large)   Pulse 92   Ht 5\' 4"  (1.626 m)   Wt 100.7 kg (222 lb)   BMI 38.11 kg/m  , BMI Body mass index is 38.11 kg/m. GENERAL:  Well appearing.  No acute distress HEENT:  PERRLA NECK:  No jugular venous distention, waveform  within normal limits, carotid upstroke brisk and symmetric, no bruits LUNGS:  Clear to auscultation bilaterally.  No  Crackles, wheezes or rhonchi HEART:  RRR.  PMI not displaced or sustained,S1 and S2 within normal limits, no S3, no S4, no clicks, no rubs, no murmurs ABD:  Flat, positive bowel sounds normal in frequency in pitch, no bruits, no rebound, no guarding, no midline pulsatile mass, no hepatomegaly, no splenomegaly EXT:  2 plus pulses throughout, no edema, no cyanosis no clubbing SKIN:  No rashes no nodules NEURO:  Cranial nerves II through XII grossly intact, motor grossly intact throughout PSYCH:  Cognitively intact, oriented to person place and time   EKG:  EKG is ordered today. 01/16/17: Ectopic atrial rhythm.  Rate 92 bpm.  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  TTE 07/2014: EF 55-60%.  E-A fusion.  Recent Labs: 02/01/2016: BUN 8; Creatinine, Ser 0.69; Hemoglobin 10.0; Potassium 4.0; Sodium 137 08/13/2016: TSH 3.820 01/06/2017: Platelets 425    Lipid Panel    Component Value Date/Time   CHOL 156 09/11/2015 1625   TRIG 57 09/11/2015 1625   HDL 55 09/11/2015 1625   CHOLHDL 2.8 09/11/2015 1625   LDLCALC 90 09/11/2015 1625      Wt Readings from Last 3 Encounters:  01/16/17 100.7 kg (222 lb)  01/06/17 102.1 kg (225 lb 1.6 oz)  08/13/16 101.5 kg (223 lb 12.8 oz)     Other studies Reviewed: Additional studies/ records that were reviewed today include:  Review of the above records demonstrates:  Please see elsewhere in the note.    24 Hour Holter Monitor 04/27/15:  Quality: Fair. Baseline artifact. Predominant rhythm: sinus rhythms Other rhythms: multiple episodes of sinus arrhythmia, one episode of ectopic atrial rhythm at 75 bpm,  Average heart rate: 78 bpm Max heart rate: 125 bpm Min heart rate: 65 bpm Pauses >2.5 seconds: 0 Ventricular ectopics: 0 Patient did not submit a symptom diary  ASSESSMENT AND PLAN:  # Hypertension: # Dizziness:  BP has been  low and she is getting lightheaded.  Stop atenolol. She was previously quite stressed and anxious.  It seems that with exercise and improvement in her mood, her blood pressure has improved.  # Ectopic atrial rhythm:  Palpitations seem to be occurring in the setting of orthostatic hypotension.  We will try stopping atenolol. She is not a good candidate for ablation due to the low septal location. Per Dr. Curt Bears she could be considered for ivabradine or antiarrhythmics if her symptoms worsen.    Current medicines are reviewed at length with the patient today.  The patient does not have concerns regarding medicines.  The following changes have been made:  Stop atenolol  Labs/ tests ordered today include: none  No orders  of the defined types were placed in this encounter.    Disposition:   FU with Dr. Jonelle Sidle C. Kieler in 6 months.   Signed, Skeet Latch, MD  01/16/2017 9:32 AM    [place]

## 2017-01-22 ENCOUNTER — Telehealth: Payer: Self-pay | Admitting: Cardiovascular Disease

## 2017-01-22 NOTE — Telephone Encounter (Signed)
Returned call to patient.She stated her B/P is elevated 157/106 pulse 98.Stated she stopped taking Atenolol 50 mg last week.Stated she wanted to try and reduce B/P naturally.Advised she needs to restart Atenolol 50 mg daily.Advised to monitor B/P daily and call back if B/P does not improve.

## 2017-01-22 NOTE — Telephone Encounter (Signed)
Pease call,ptt said she was told to stopped taking her Atenolol last week.Today here blood pressure is up,it was 157/108 and heart rate was 98.

## 2017-01-22 NOTE — Telephone Encounter (Signed)
Agree with this plan.  If her BP gets too low she can try 25 mg.

## 2017-01-23 NOTE — Telephone Encounter (Signed)
Returned call to patient.Left message on personal voice mail.Dr.Randoph agreed with restarting Atenolol 50 mg daily.She advised if B/P gets too low then decrease Atenolol to 25 mg daily.Advised to monitor B/P and call back if B/P does not improve or if she has any questions.

## 2017-02-08 ENCOUNTER — Emergency Department (HOSPITAL_COMMUNITY)
Admission: EM | Admit: 2017-02-08 | Discharge: 2017-02-08 | Disposition: A | Discharge status: Home / Self Care | Payer: Self-pay | Attending: Emergency Medicine | Admitting: Emergency Medicine

## 2017-02-08 ENCOUNTER — Encounter (HOSPITAL_COMMUNITY): Payer: Self-pay | Admitting: Emergency Medicine

## 2017-02-08 ENCOUNTER — Emergency Department (HOSPITAL_COMMUNITY): Payer: Self-pay

## 2017-02-08 DIAGNOSIS — E039 Hypothyroidism, unspecified: Secondary | ICD-10-CM | POA: Insufficient documentation

## 2017-02-08 DIAGNOSIS — R7989 Other specified abnormal findings of blood chemistry: Secondary | ICD-10-CM

## 2017-02-08 DIAGNOSIS — R946 Abnormal results of thyroid function studies: Secondary | ICD-10-CM | POA: Insufficient documentation

## 2017-02-08 DIAGNOSIS — I10 Essential (primary) hypertension: Secondary | ICD-10-CM | POA: Insufficient documentation

## 2017-02-08 DIAGNOSIS — R002 Palpitations: Secondary | ICD-10-CM | POA: Insufficient documentation

## 2017-02-08 DIAGNOSIS — Z79899 Other long term (current) drug therapy: Secondary | ICD-10-CM | POA: Insufficient documentation

## 2017-02-08 LAB — CBC WITH DIFFERENTIAL/PLATELET
BASOS PCT: 0 %
Basophils Absolute: 0 10*3/uL (ref 0.0–0.1)
EOS PCT: 2 %
Eosinophils Absolute: 0.1 10*3/uL (ref 0.0–0.7)
HCT: 30.8 % — ABNORMAL LOW (ref 36.0–46.0)
Hemoglobin: 8.9 g/dL — ABNORMAL LOW (ref 12.0–15.0)
LYMPHS ABS: 2.3 10*3/uL (ref 0.7–4.0)
Lymphocytes Relative: 34 %
MCH: 18.5 pg — AB (ref 26.0–34.0)
MCHC: 28.9 g/dL — ABNORMAL LOW (ref 30.0–36.0)
MCV: 63.9 fL — AB (ref 78.0–100.0)
Monocytes Absolute: 0.6 10*3/uL (ref 0.1–1.0)
Monocytes Relative: 8 %
Neutro Abs: 3.9 10*3/uL (ref 1.7–7.7)
Neutrophils Relative %: 56 %
PLATELETS: 384 10*3/uL (ref 150–400)
RBC: 4.82 MIL/uL (ref 3.87–5.11)
RDW: 18.5 % — AB (ref 11.5–15.5)
WBC: 6.9 10*3/uL (ref 4.0–10.5)

## 2017-02-08 LAB — BASIC METABOLIC PANEL
ANION GAP: 9 (ref 5–15)
BUN: 10 mg/dL (ref 6–20)
CALCIUM: 9.5 mg/dL (ref 8.9–10.3)
CO2: 23 mmol/L (ref 22–32)
Chloride: 103 mmol/L (ref 101–111)
Creatinine, Ser: 0.9 mg/dL (ref 0.44–1.00)
GFR calc Af Amer: >60 mL/min (ref >60)
GLUCOSE: 96 mg/dL (ref 65–99)
Potassium: 3.8 mmol/L (ref 3.5–5.1)
SODIUM: 135 mmol/L (ref 135–145)

## 2017-02-08 LAB — I-STAT TROPONIN, ED: Troponin i, poc: 0 ng/mL (ref 0.00–0.08)

## 2017-02-08 LAB — I-STAT BETA HCG BLOOD, ED (MC, WL, AP ONLY): I-stat hCG, quantitative: <5 m[IU]/mL (ref <5)

## 2017-02-08 LAB — TSH: TSH: 5.342 u[IU]/mL — ABNORMAL HIGH (ref 0.350–4.500)

## 2017-02-08 NOTE — Discharge Instructions (Signed)
Make sure you're taking your atenolol every day. Your TSH today was 5.342. Please see your primary care provider this week for follow-up of today's visit and evaluation of your TSH level. Please follow-up with your cardiologist for further evaluation of your palpitations. Please return to the emergency department to develop any chest pain, worsening shortness of breath, pain with taking a deep breath, or any other new or concerning symptoms.

## 2017-02-08 NOTE — ED Provider Notes (Signed)
[facility] DEPT Provider Note   CSN: [id number] Arrival date & time: 02/08/17  1559     History   Chief Complaint Chief Complaint  Patient presents with  . Palpitations    HPI Alyssa Hall is a 31 y.o. female with history of atrial tachycardia, hypothyroidism, Mnire's disease who presents with palpitations that have been more prominent than usual for the past 1-2 days. Patient describes a pounding in her chest that has been constant. There are no alleviating or aggravating factors. Patient reports she takes atenolol for this, but only takes it about every other day. She has taken in the past 2 days since her symptoms started, however. Patient notes shortness of breath at long distances, however patient states this is not new for her. She denies any chest pain or pleuritic symptoms. She notes mild left leg swelling over the past few days and mild anterior L leg pain, but no calf pain. She denies any injury. She denies any recent long trips or immobilizations, cancer, or recent surgeries.  HPI  Past Medical History:  Diagnosis Date  . Anemia   . Anxiety   . Atrial tachycardia ([place])   . Depression   . Dizziness   . Fibroids   . Hypertension   . Hypothyroid   . Meniere's disease   . Thyrotoxicosis     Patient Active Problem List   Diagnosis Date Noted  . Muscle strain 03/28/2015  . 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 Healthcare maintenance 05/27/2013  . Meniere's disease 07/12/2012  . Dysmenorrhea 05/05/2012  . Iron deficiency anemia 02/04/2012  . Hypothyroidism (acquired) 01/14/2012  . Generalized anxiety disorder 01/14/2012    No past surgical history on file.  OB History    Gravida Para Term Preterm AB Living   1       1     SAB TAB Ectopic Multiple Live Births     1             Home Medications    Prior to Admission medications   Medication Sig Start Date End Date Taking? Authorizing Provider  atenolol (TENORMIN) 50 MG tablet Take 50 mg by  mouth daily.    [provider]  ferrous sulfate 325 (65 FE) MG tablet Take 1 tablet (325 mg total) by mouth every other day. 08/12/16   Burns, Arloa Koh, MD  levothyroxine (SYNTHROID, LEVOTHROID) 25 MCG tablet TAKE 1 TABLET(25 MCG) BY MOUTH DAILY BEFORE BREAKFAST 03/11/16   Burns, Arloa Koh, MD  meclizine (ANTIVERT) 25 MG tablet Take 1-2 tablets (25-50 mg total) by mouth 3 (three) times daily as needed for dizziness. 01/06/17   Velna Ochs, MD    Family History Family History  Problem Relation Age of Onset  . Heart failure Maternal Aunt     Social History Social History  Substance Use Topics  . Smoking status: Never Smoker  . Smokeless tobacco: Never Used  . Alcohol use No     Allergies   Patient has no known allergies.   Review of Systems Review of Systems  Constitutional: Negative for chills and fever.  HENT: Negative for facial swelling and sore throat.   Respiratory: Positive for shortness of breath (Baseline on exertion).   Cardiovascular: Positive for palpitations and leg swelling (mild L). Negative for chest pain.  Gastrointestinal: Negative for abdominal pain, nausea and vomiting.  Genitourinary: Negative for dysuria.  Musculoskeletal: Negative for back pain.  Skin: Negative for rash and wound.  Neurological: Positive for dizziness (Intermittent  at baseline due to Mnire's disease). Negative for headaches.  Psychiatric/Behavioral: The patient is not nervous/anxious.      Physical Exam Updated Vital Signs BP (!) 123/93   Pulse 82   Temp 98.6 F (37 C) (Oral)   Resp 19   Ht 5\' 4"  (1.626 m)   LMP 01/08/2017 (Approximate)   SpO2 100%   Physical Exam  Constitutional: She appears well-developed and well-nourished. No distress.  HENT:  Head: Normocephalic and atraumatic.  Mouth/Throat: Oropharynx is clear and moist. No oropharyngeal exudate.  Eyes: Conjunctivae are normal. Pupils are equal, round, and reactive to light. Right eye exhibits no discharge.  Left eye exhibits no discharge. No scleral icterus.  Neck: Normal range of motion. Neck supple. No thyromegaly present.  Cardiovascular: Normal rate, regular rhythm, normal heart sounds and intact distal pulses.  Exam reveals no gallop and no friction rub.   No murmur heard. Pulmonary/Chest: Effort normal and breath sounds normal. No stridor. No respiratory distress. She has no wheezes. She has no rales.  Abdominal: Soft. Bowel sounds are normal. She exhibits no distension. There is no tenderness. There is no rebound and no guarding.  Musculoskeletal: She exhibits no edema.  No significant edema noted, lower extremities symmetrical, no calf tenderness bilaterally; tenderness to the left anterior tibia just distal to knee  Lymphadenopathy:    She has no cervical adenopathy.  Neurological: She is alert. Coordination normal.  Skin: Skin is warm and dry. No rash noted. She is not diaphoretic. No pallor.  Psychiatric: She has a normal mood and affect.  Nursing note and vitals reviewed.    ED Treatments / Results  Labs (all labs ordered are listed, but only abnormal results are displayed) Labs Reviewed  CBC WITH DIFFERENTIAL/PLATELET - Abnormal; Notable for the following:       Result Value   Hemoglobin 8.9 (*)    HCT 30.8 (*)    MCV 63.9 (*)    MCH 18.5 (*)    MCHC 28.9 (*)    RDW 18.5 (*)    All other components within normal limits  TSH - Abnormal; Notable for the following:    TSH 5.342 (*)    All other components within normal limits  BASIC METABOLIC PANEL  I-STAT TROPOININ, ED  I-STAT BETA HCG BLOOD, ED ([facility], [facility], [facility] ONLY)    EKG  EKG Interpretation  Date/Time:  Sunday February 08 2017 16:18:04 EDT Ventricular Rate:  92 PR Interval:  164 QRS Duration: 80 QT Interval:  358 QTC Calculation: 442 R Axis:   31 Text Interpretation:  Unusual P axis, possible ectopic atrial rhythm Abnormal ECG No significant change since last tracing Confirmed by Isla Pence [phone]) on 02/08/2017  5:36:02 PM       Radiology Dg Chest 2 View  Result Date: 02/08/2017 CLINICAL DATA:  Chest palpitations for 2 weeks. EXAM: CHEST  2 VIEW COMPARISON:  PA and lateral chest 01/03/2016 and 02/08/2015. FINDINGS: The lungs are clear. Heart size is normal. No pneumothorax or pleural effusion. No acute bony abnormality. IMPRESSION: Negative chest. Electronically Signed   By: Inge Rise M.D.   On: 02/08/2017 16:46    Procedures Procedures (including critical care time)  Medications Ordered in ED Medications - No data to display   Initial Impression / Assessment and Plan / ED Course  I have reviewed the triage vital signs and the nursing notes.  Pertinent labs & imaging results that were available during my care of the patient were reviewed by me  and considered in my medical decision making (see chart for details).     CBC shows stable chronic anemia, hemoglobin 8.9. Patient is taking iron. BMP, hCG, troponin negative. TSH 5.342. CXR negative. EKG is unchanged from past showing possible ectopic atrial rhythm. PERC negative. Patient's palpitations consistent with past. Patient has not been taking her atenolol regularly. I have encouraged her that it is important to take it regularly and follow-up with her cardiologist at her scheduled appointment coming up soon. It is unlikely that TSH level is causing patient's palpitations, however patient advised to follow up with PCP this week as well for further evaluation. Return precautions discussed. Patient understands and agrees with plan. Patient vitals stable throughout ED course and discharged in satisfactory condition. I discussed patient case with Dr. Gilford Raid who guided the patient's management and agrees with plan.   Final Clinical Impressions(s) / ED Diagnoses   Final diagnoses:  Palpitations  Elevated TSH    New Prescriptions New Prescriptions   No medications on file     Caryl Ada 02/08/17 1912    Isla Pence, MD 02/08/17 2005

## 2017-02-08 NOTE — ED Notes (Signed)
Pt understood dc material. NAD noted. 

## 2017-02-08 NOTE — ED Triage Notes (Addendum)
Pt reports two weeks of palpations, feeling as if her heart is pounding. Denies associated symptoms.  Pt also reports she could be pregnant, unsure of last period and reports her nipples are tender and has occasional nausea for two weeks with dizziness and fatigue.

## 2017-02-11 ENCOUNTER — Encounter: Payer: Self-pay | Admitting: *Deleted

## 2017-02-12 ENCOUNTER — Ambulatory Visit: Payer: Self-pay

## 2017-02-13 ENCOUNTER — Ambulatory Visit (INDEPENDENT_AMBULATORY_CARE_PROVIDER_SITE_OTHER): Payer: Self-pay | Admitting: Internal Medicine

## 2017-02-13 VITALS — BP 133/69 | HR 88 | Temp 99.3°F | Ht 64.0 in | Wt 225.4 lb

## 2017-02-13 DIAGNOSIS — I471 Supraventricular tachycardia: Secondary | ICD-10-CM

## 2017-02-13 DIAGNOSIS — H8102 Meniere's disease, left ear: Secondary | ICD-10-CM

## 2017-02-13 DIAGNOSIS — E039 Hypothyroidism, unspecified: Secondary | ICD-10-CM

## 2017-02-13 IMAGING — CR DG CHEST 2V
2 series · 2 of 2 positions shown · non-contrast
Comparison: 09/10/2013 and 01/21/2012 radiographs.

CLINICAL DATA: Palpitations with blurred vision, headache and
syncopal episode today.

EXAM:
CHEST  2 VIEW

[chest pa]
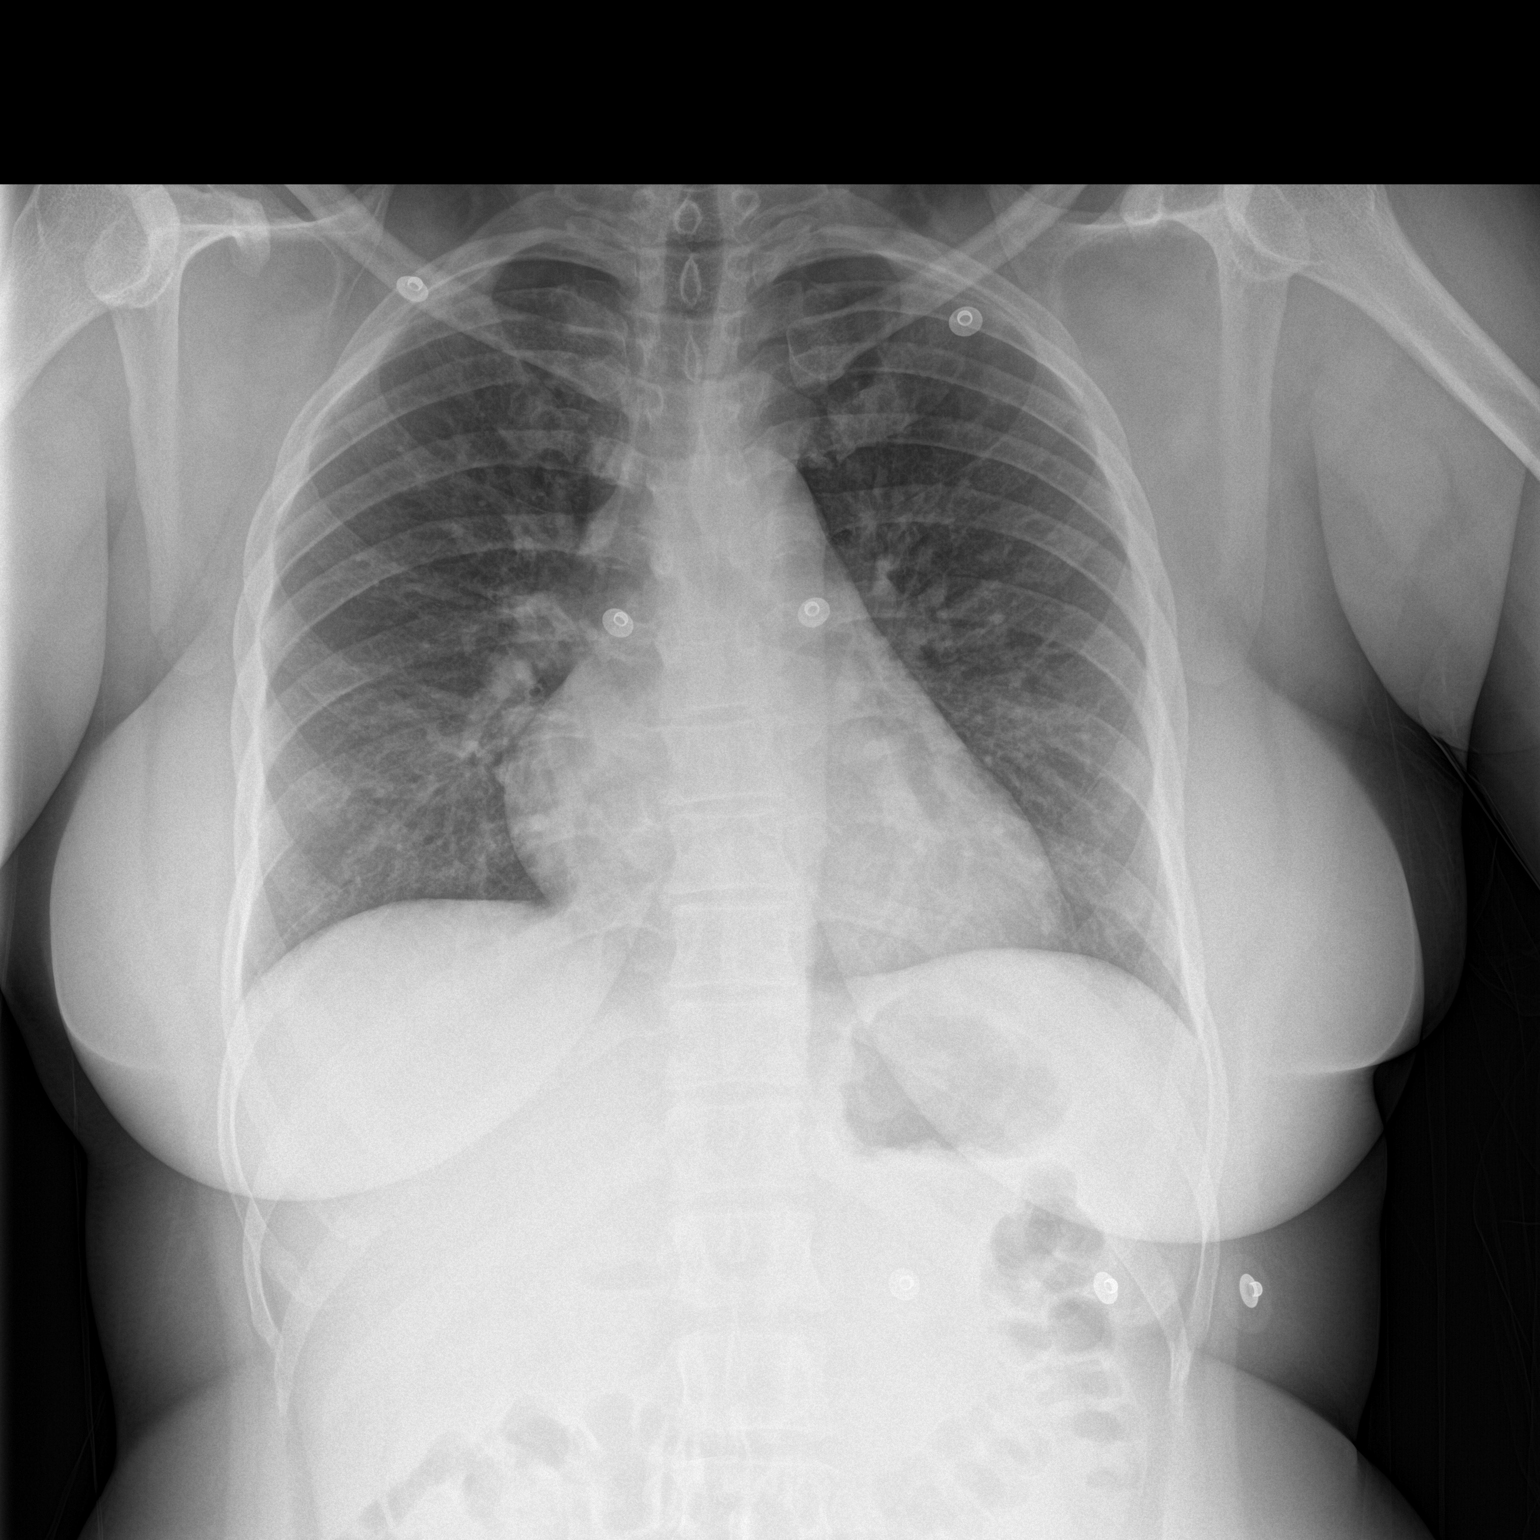

[chest lat]
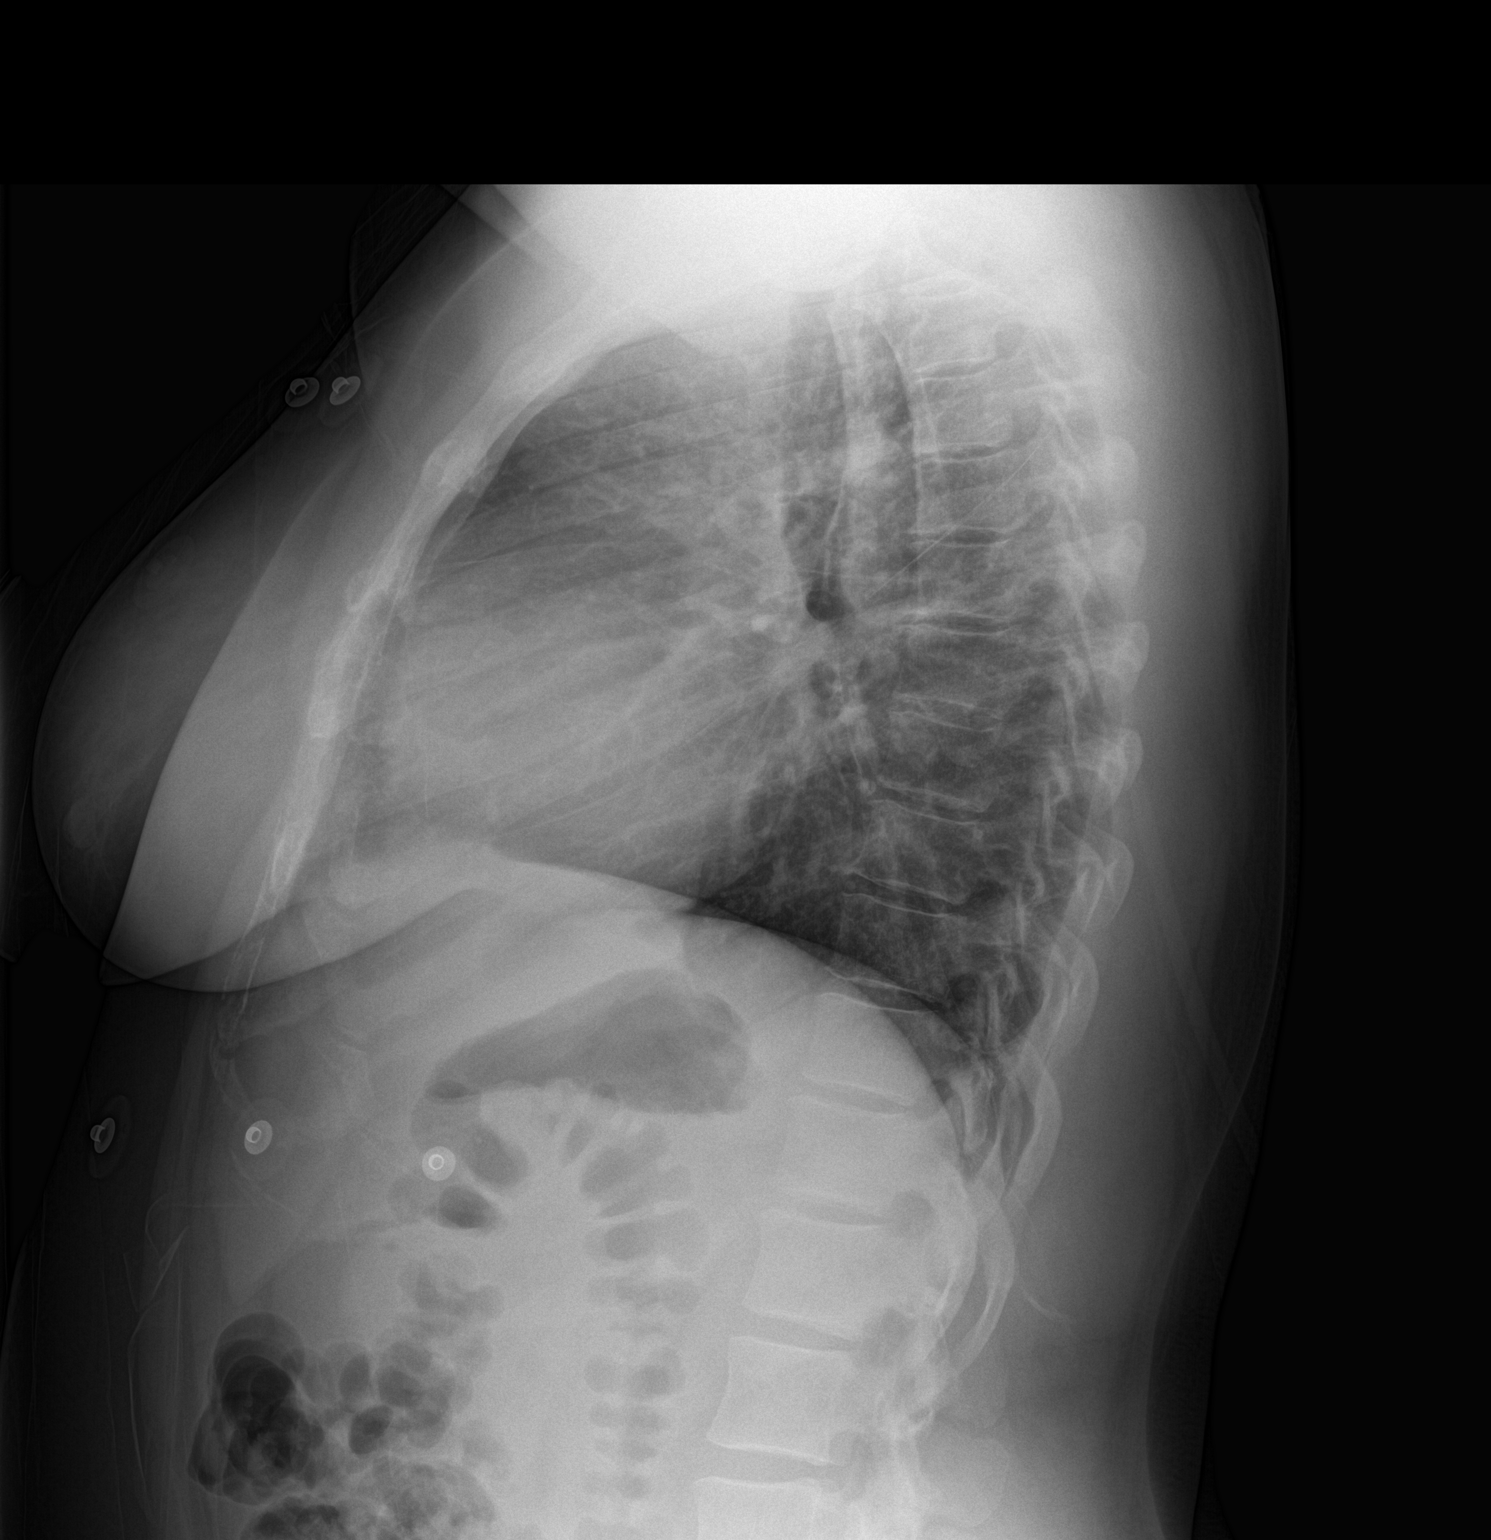

[2 of 2 positions shown; findings below may reference images not displayed]

FINDINGS: The heart size and mediastinal contours are stable. There is
possible mild central airway thickening but no hyperinflation,
confluent airspace opacity, edema or pleural effusion. The bones
appear stable.
IMPRESSION: Possible mild central airway thickening. No confluent airspace
opacity or other significant findings.

## 2017-02-13 MED ORDER — ATENOLOL 50 MG PO TABS: 50.0000 mg | ORAL_TABLET | Freq: Every day | ORAL | 0 refills | Status: DC

## 2017-02-13 MED ORDER — HYDROCHLOROTHIAZIDE 12.5 MG PO CAPS: 12.5000 mg | ORAL_CAPSULE | Freq: Every day | ORAL | 0 refills | Status: DC

## 2017-02-13 NOTE — Assessment & Plan Note (Signed)
History of present illness Patient has a history of atrial ectopic tachycardia. She was seen by electrophysiology in February 2017 and was thought not to be a good candidate for ablation because the source was low in the atrial septum, possibly posterior. Per EP, patient would be a candidate for ivabradine or antiarrhythmics if her symptoms worsen. She was last seen by cardiology on 01/16/2017 and atenolol was stopped at that time because her blood pressure was low. Patient states she started taking the atenolol again towards the end of May. She then presented to the emergency room on 02/08/2017 for palpitations. Per ED note, pulse was 82. EKG with normal sinus rhythm and heart rate 92. She does have a history of iron deficiency anemia (on an iron supplement) and hemoglobin checked at the ED visit was stable at 8.9. At present, patient denies having any heart palpitations. Reports compliance with atenolol. She does not believe it was palpitations that led her to go to the ED. Instead, patient believes it was her worsening meniere's disease.  Assessment History of atrial ectopic tachycardia. Pulse 88 at this visit.  Plan -Continue atenolol 25 mg daily. She has an appointment with cardiology on June 20.

## 2017-02-13 NOTE — Progress Notes (Signed)
   CC: Patient is here for an ED follow-up of palpitations. Meniere's disease and hypothyroidism were also discussed during this visit.  HPI:  Ms.Kaleyah Gabriela Eves is a 31 y.o. female with a past medical history of conditions listed below presenting to the clinic for an ED follow-up of palpitations. Meniere's disease and hypothyroidism were also discussed during this visit. Please see problem based charting for the status of the patient's current and chronic medical conditions.    Past Medical History:  Diagnosis Date  . Anemia   . Anxiety   . Atrial tachycardia ([place])   . Depression   . Dizziness   . Fibroids   . Hypertension   . Hypothyroid   . Meniere's disease   . Thyrotoxicosis     Review of Systems: Pertinent positives mentioned in HPI. Remainder of all ROS negative.   Physical Exam:  Vitals:   02/13/17 1509  BP: 133/69  Pulse: 88  Temp: 99.3 F (37.4 C)  TempSrc: Oral  SpO2: 100%  Weight: 225 lb 6.4 oz (102.2 kg)  Height: 5\' 4"  (1.626 m)   Physical Exam  Constitutional: She is oriented to person, place, and time. She appears well-developed and well-nourished. No distress.  HENT:  Head: Normocephalic and atraumatic.  Mouth/Throat: Oropharynx is clear and moist.  Eyes: Right eye exhibits no discharge. Left eye exhibits no discharge.  Cardiovascular: Normal rate, regular rhythm and intact distal pulses.   Pulmonary/Chest: Effort normal and breath sounds normal. No respiratory distress. She has no wheezes. She has no rales.  Abdominal: Soft. Bowel sounds are normal. She exhibits no distension. There is no tenderness.  Musculoskeletal: She exhibits no edema.  Neurological: She is alert and oriented to person, place, and time.  Skin: Skin is warm and dry.    Assessment & Plan:   See Encounters Tab for problem based charting.  Patient discussed with Dr. Dareen Piano

## 2017-02-13 NOTE — Assessment & Plan Note (Addendum)
Assessment Patient reports having pulsatile tinnitus and high-pitched sounds in her left ear for the past 2 weeks. Reports having daily dizziness/ lightheadedness for the past 5 years. Denies having any headaches, ear pain, or ear discharge. States she was previously seen by ENT and was on hydrochlorothiazide which had helped with her symptoms. This medication could not be resumed during her previous visit because her blood pressure was low and instead patient was prescribed meclizine. Patient states meclizine is not helping her at all.  Plan -Blood pressure 133/69 at this visit. Start hydrochlorothiazide 12.5 mg daily. Advised her to hold hydrochlorothiazide if she starts feeling more dizzy as that might be a sign of orthostasis (she is also on atenolol for tachycardia).  -Referral to PT for vestibular rehabilitation -ENT referral

## 2017-02-13 NOTE — Patient Instructions (Addendum)
[name] it was nice meeting you today.  Continue taking atenolol  Start taking hydrochlorothiazide 12.5 mg once daily.   I have referred you to physical therapy for vestibular rehabilitation.  You have also been referred to ENT.  Please stop taking hydrochlorothiazide if you feel more dizzy or lightheaded.  Return for a follow-up visit in 3 months.

## 2017-02-13 NOTE — Assessment & Plan Note (Signed)
Assessment TSH mildly elevated at 5.342 during recent ED visit. She is currently on Synthroid 25 mg daily and reports compliance. Would be very careful titrating up the dose of Synthroid as she has a history of atrial ectopic tachycardia.  Plan -Continue Synthroid 25 mg daily -Repeat thyroid function tests (TSH, free T4) in 3 months

## 2017-02-16 NOTE — Progress Notes (Signed)
Internal Medicine Clinic Attending  Case discussed with Dr. Rathoreat the time of the visit. We reviewed the resident's history and exam and pertinent patient test results. I agree with the assessment, diagnosis, and plan of care documented in the resident's note.  

## 2017-02-25 ENCOUNTER — Encounter: Payer: Self-pay | Admitting: Cardiovascular Disease

## 2017-02-25 ENCOUNTER — Ambulatory Visit (INDEPENDENT_AMBULATORY_CARE_PROVIDER_SITE_OTHER): Payer: Self-pay | Admitting: Cardiovascular Disease

## 2017-02-25 VITALS — BP 102/70 | HR 84 | Ht 64.0 in | Wt 222.0 lb

## 2017-02-25 DIAGNOSIS — I10 Essential (primary) hypertension: Secondary | ICD-10-CM

## 2017-02-25 DIAGNOSIS — R42 Dizziness and giddiness: Secondary | ICD-10-CM

## 2017-02-25 DIAGNOSIS — I471 Supraventricular tachycardia: Secondary | ICD-10-CM

## 2017-02-25 DIAGNOSIS — E0590 Thyrotoxicosis, unspecified without thyrotoxic crisis or storm: Secondary | ICD-10-CM

## 2017-02-25 DIAGNOSIS — E039 Hypothyroidism, unspecified: Secondary | ICD-10-CM

## 2017-02-25 NOTE — Patient Instructions (Signed)
Medication Instructions:  Call the numbers given to check on the [place] assistance Call [place] with information at [phone]  Labwork: TSH/FT4 TODAY   Testing/Procedures: NONE  Follow-Up: [place]   If you need a refill on your cardiac medications before your next appointment, please call your pharmacy.

## 2017-02-25 NOTE — Progress Notes (Signed)
Cardiology Office Note   Date:  02/25/2017   ID:  [name], DOB Mar 21, 1986, MRN [id number]  PCP:  Florinda Marker, MD  Cardiologist:   Skeet Latch, MD   No chief complaint on file.     [name] is a 31 y.o. female with HTN, atrial tachycardia, and hypothyroidism who presents for follow up on her palpitations.    History of Present Illness: [name] was first evaluated 05/2015 with palpitations. She had a Holter that revealed sinus arrhythmia and one PAC but no other abnormalities. She was treated on atenolol and then digoxin without improvement in her symptoms.  The episodes were associated with shortness of breath, lightheadedness, and dizziness. They frequently we occurred in the setting of stressful situations.  She was subsequently started on Zoloft in October 2016 but did not start taking until November. She also received lorazepam and was referred for counselling.  She was seen in the emergency department after having a panic attack while driving her car. At the time she felt dizzy and had numbness and tingling in her hand. She left without being seen but then returned on 09/05/59 with palpitations. Her EKG at that time revealed atrial tachycardia with a rate of 106 bpm.  She followed up with her primary care physician, at which time atenolol 50 mg daily was started.  Her TSH was within normal limits.  [name] saw Dr. Curt Bears on 10/2015 and he felt that she would not be a good candidate for ablation of her atrial tachycardia because the source was in the low atrial septum and posterior.  At her last appointment atenolol was discontinued due to hypotension and dizziness.  She initially stopped the atenolol but then her BP was 150/90 so she started back.  She has noted increased palpitations recently. She had a very bad episode 2 days ago that lasted for over an hour. She also complains of weight gain. She notes that her TSH has been elevated and she thinks this  might be why. She recently saw her PCP and levothyroxine was not increased due to concern that it might worsen her palpitations.  They recommended that she start hydrochlorothiazide for her Mnire's disease. However she didn't want to do this because her blood pressure might get too low.   Past Medical History:  Diagnosis Date  . Anemia   . Anxiety   . Atrial tachycardia ([place])   . Depression   . Dizziness   . Fibroids   . Hypertension   . Hypothyroid   . Meniere's disease   . Thyrotoxicosis     History reviewed. No pertinent surgical history.   Current Outpatient Prescriptions  Medication Sig Dispense Refill  . atenolol (TENORMIN) 50 MG tablet Take 1 tablet (50 mg total) by mouth daily. 90 tablet 0  . ferrous sulfate 325 (65 FE) MG tablet Take 1 tablet (325 mg total) by mouth every other day. 45 tablet 3  . hydrochlorothiazide (MICROZIDE) 12.5 MG capsule Take 1 capsule (12.5 mg total) by mouth daily. 90 capsule 0  . levothyroxine (SYNTHROID, LEVOTHROID) 25 MCG tablet TAKE 1 TABLET(25 MCG) BY MOUTH DAILY BEFORE BREAKFAST 90 tablet 3   No current facility-administered medications for this visit.     Allergies:   Patient has no known allergies.    Social History:  The patient  reports that she has never smoked. She has never used smokeless tobacco. She reports that she does not drink alcohol or use drugs.  Family History:  The patient's family history includes Heart failure in her maternal aunt.    ROS:  Please see the history of present illness.   Otherwise, review of systems are positive for tingling in legs when she sleeps.   All other systems are reviewed and negative.    PHYSICAL EXAM: VS:  BP 102/70   Pulse 84   Ht 5\' 4"  (1.626 m)   Wt 100.7 kg (222 lb)   SpO2 99%   BMI 38.11 kg/m  , BMI Body mass index is 38.11 kg/m. GENERAL:  Well appearing.  No acute distress HEENT:  PERRLA NECK:  No jugular venous distention, waveform within normal limits, carotid upstroke  brisk and symmetric, no bruits LUNGS:  Clear to auscultation bilaterally.   HEART:  Regular rate and rhythm.  PMI not displaced or sustained,S1 and S2 within normal limits, no S3, no S4, no clicks, no rubs, no murmurs ABD:  Flat, positive bowel sounds normal in frequency in pitch, no bruits, no rebound, no guarding, no midline pulsatile mass, no hepatomegaly, no splenomegaly EXT:  2 plus pulses throughout, no edema, no cyanosis no clubbing SKIN:  No rashes no nodules NEURO:  Cranial nerves II through XII grossly intact, motor grossly intact throughout PSYCH:  Cognitively intact, oriented to person place and time   EKG:  EKG is not ordered today. 01/16/17: Ectopic atrial rhythm.  Rate 92 bpm.  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  TTE 07/2014: EF 55-60%.  E-A fusion.  Recent Labs: 02/08/2017: BUN 10; Creatinine, Ser 0.90; Hemoglobin 8.9; Platelets 384; Potassium 3.8; Sodium 135; TSH 5.342    Lipid Panel    Component Value Date/Time   CHOL 156 09/11/2015 1625   TRIG 57 09/11/2015 1625   HDL 55 09/11/2015 1625   CHOLHDL 2.8 09/11/2015 1625   LDLCALC 90 09/11/2015 1625      Wt Readings from Last 3 Encounters:  02/25/17 100.7 kg (222 lb)  02/13/17 102.2 kg (225 lb 6.4 oz)  01/16/17 100.7 kg (222 lb)     Other studies Reviewed: Additional studies/ records that were reviewed today include:  Review of the above records demonstrates:  Please see elsewhere in the note.    24 Hour Holter Monitor 04/27/15:  Quality: Fair. Baseline artifact. Predominant rhythm: sinus rhythms Other rhythms: multiple episodes of sinus arrhythmia, one episode of ectopic atrial rhythm at 75 bpm,  Average heart rate: 78 bpm Max heart rate: 125 bpm Min heart rate: 65 bpm Pauses >2.5 seconds: 0 Ventricular ectopics: 0 Patient did not submit a symptom diary  ASSESSMENT AND PLAN:  # Hypertension: # Dizziness:  # Ectopic atrial rhythm: # Palpitations: BP is controlled on atenolol.  However she  needs a diuretic for Mnire's disease. We will stop atenolol and start hydrochlorothiazide as recommended by her PCP. We will start ivabradine 5 mg twice daily or her ectopic atrial rhythm.   She is not a good candidate for ablation due to the low septal location.   # Hypothyroidism: Check TSH/free T4 today.  If TSH remains elevated she will need to increase levothyroxine.  Manage palpitations as above.  Current medicines are reviewed at length with the patient today.  The patient does not have concerns regarding medicines.  The following changes have been made:  Stop atenolol.  Start HCTZ and ivabradine.  Labs/ tests ordered today include: none  Orders Placed This Encounter  Procedures  . T4, free  . TSH     Disposition:  FU with Dr. Lilli Light. [place] in 1 month.   Signed, Skeet Latch, MD  02/25/2017 12:58 PM    [facility]

## 2017-02-26 LAB — TSH: TSH: 5.59 u[IU]/mL — ABNORMAL HIGH (ref 0.450–4.500)

## 2017-02-26 LAB — T4, FREE: FREE T4: 1.13 ng/dL (ref 0.82–1.77)

## 2017-03-06 ENCOUNTER — Ambulatory Visit: Payer: Self-pay

## 2017-03-19 ENCOUNTER — Telehealth: Payer: Self-pay | Admitting: Internal Medicine

## 2017-03-19 NOTE — Telephone Encounter (Signed)
CALLED PATIENT, SHE HAS APPT TODAY WITH [facility] AND IS BRINGING ME HER MISSING PAPER WORK TO COMPLETE [facility] APP

## 2017-03-21 ENCOUNTER — Emergency Department (HOSPITAL_COMMUNITY): Payer: Self-pay

## 2017-03-21 ENCOUNTER — Emergency Department (HOSPITAL_COMMUNITY)
Admission: EM | Admit: 2017-03-21 | Discharge: 2017-03-21 | Disposition: A | Discharge status: Home / Self Care | Payer: Self-pay | Attending: Emergency Medicine | Admitting: Emergency Medicine

## 2017-03-21 ENCOUNTER — Encounter (HOSPITAL_COMMUNITY): Payer: Self-pay | Admitting: Emergency Medicine

## 2017-03-21 DIAGNOSIS — H8109 Meniere's disease, unspecified ear: Secondary | ICD-10-CM | POA: Insufficient documentation

## 2017-03-21 DIAGNOSIS — M79671 Pain in right foot: Secondary | ICD-10-CM | POA: Insufficient documentation

## 2017-03-21 DIAGNOSIS — Z79899 Other long term (current) drug therapy: Secondary | ICD-10-CM | POA: Insufficient documentation

## 2017-03-21 DIAGNOSIS — R002 Palpitations: Secondary | ICD-10-CM | POA: Insufficient documentation

## 2017-03-21 DIAGNOSIS — E039 Hypothyroidism, unspecified: Secondary | ICD-10-CM | POA: Insufficient documentation

## 2017-03-21 DIAGNOSIS — R000 Tachycardia, unspecified: Secondary | ICD-10-CM | POA: Insufficient documentation

## 2017-03-21 DIAGNOSIS — I10 Essential (primary) hypertension: Secondary | ICD-10-CM | POA: Insufficient documentation

## 2017-03-21 DIAGNOSIS — M79672 Pain in left foot: Secondary | ICD-10-CM | POA: Insufficient documentation

## 2017-03-21 LAB — CBC
HCT: 31 % — ABNORMAL LOW (ref 36.0–46.0)
Hemoglobin: 9.2 g/dL — ABNORMAL LOW (ref 12.0–15.0)
MCH: 19.9 pg — ABNORMAL LOW (ref 26.0–34.0)
MCHC: 29.7 g/dL — AB (ref 30.0–36.0)
MCV: 67.1 fL — ABNORMAL LOW (ref 78.0–100.0)
PLATELETS: 404 10*3/uL — AB (ref 150–400)
RBC: 4.62 MIL/uL (ref 3.87–5.11)
RDW: 21.3 % — AB (ref 11.5–15.5)
WBC: 4.8 10*3/uL (ref 4.0–10.5)

## 2017-03-21 LAB — BASIC METABOLIC PANEL
Anion gap: 8 (ref 5–15)
BUN: 9 mg/dL (ref 6–20)
CALCIUM: 9.6 mg/dL (ref 8.9–10.3)
CO2: 26 mmol/L (ref 22–32)
CREATININE: 0.78 mg/dL (ref 0.44–1.00)
Chloride: 103 mmol/L (ref 101–111)
GFR calc Af Amer: >60 mL/min (ref >60)
Glucose, Bld: 110 mg/dL — ABNORMAL HIGH (ref 65–99)
POTASSIUM: 3.9 mmol/L (ref 3.5–5.1)
SODIUM: 137 mmol/L (ref 135–145)

## 2017-03-21 LAB — I-STAT TROPONIN, ED: Troponin i, poc: 0 ng/mL (ref 0.00–0.08)

## 2017-03-21 NOTE — ED Provider Notes (Signed)
[facility] DEPT Provider Note   CSN: [id number] Arrival date & time: 03/21/17  1223     History   Chief Complaint Chief Complaint  Patient presents with  . Palpitations  . Eye Problem  . Headache  . Foot Pain    HPI Alyssa Hall is a 31 y.o. female.   Palpitations   This is a recurrent problem. Episode onset: several yrs but more frequent in the last 3 weeks. The problem occurs daily. The problem has not changed since onset.Associated with: changing positions (standing from sitting) and being anxious. On average, each episode lasts 5 minutes. Associated symptoms include malaise/fatigue and headaches. Pertinent negatives include no diaphoresis, no fever, no chest pain, no chest pressure, no exertional chest pressure, no irregular heartbeat, no PND, no nausea, no vomiting, no leg pain, no lower extremity edema, no dizziness, no cough and no shortness of breath. Treatments tried: sleep. The treatment provided significant relief. Her past medical history is significant for anemia. Past medical history comments: hypothyroidism.  Eye Problem   Injury mechanism: anxiety. Pertinent negatives include no nausea and no vomiting.  Headache   This is a recurrent problem. The problem has been resolved. Associated symptoms include malaise/fatigue and palpitations. Pertinent negatives include no fever, no chest pressure, no shortness of breath, no nausea and no vomiting.  Foot Pain  This is a new problem. Episode onset: 2 weeks. The problem occurs constantly. Associated symptoms include headaches. Pertinent negatives include no chest pain and no shortness of breath.   States that she has been more stressed recently due to preparing for her wedding early next month.   Past Medical History:  Diagnosis Date  . Anemia   . Anxiety   . Atrial tachycardia ([place])   . Depression   . Dizziness   . Fibroids   . Hypertension   . Hypothyroid   . Meniere's disease   . Thyrotoxicosis     Patient  Active Problem List   Diagnosis Date Noted  . Muscle strain 03/28/2015  . 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 Healthcare maintenance 05/27/2013  . Meniere's disease 07/12/2012  . Dysmenorrhea 05/05/2012  . Iron deficiency anemia 02/04/2012  . Hypothyroidism (acquired) 01/14/2012  . Generalized anxiety disorder 01/14/2012    History reviewed. No pertinent surgical history.  OB History    Gravida Para Term Preterm AB Living   1       1     SAB TAB Ectopic Multiple Live Births     1             Home Medications    Prior to Admission medications   Medication Sig Start Date End Date Taking? Authorizing Provider  atenolol (TENORMIN) 50 MG tablet Take 1 tablet (50 mg total) by mouth daily. 02/13/17   Shela Leff, MD  ferrous sulfate 325 (65 FE) MG tablet Take 1 tablet (325 mg total) by mouth every other day. 08/12/16   Burns, Arloa Koh, MD  hydrochlorothiazide (MICROZIDE) 12.5 MG capsule Take 1 capsule (12.5 mg total) by mouth daily. 02/13/17 02/13/18  Shela Leff, MD  levothyroxine (SYNTHROID, LEVOTHROID) 25 MCG tablet TAKE 1 TABLET(25 MCG) BY MOUTH DAILY BEFORE BREAKFAST 03/11/16   Florinda Marker, MD    Family History Family History  Problem Relation Age of Onset  . Heart failure Maternal Aunt     Social History Social History  Substance Use Topics  . Smoking status: Never Smoker  . Smokeless tobacco: Never Used  . Alcohol use  No     Allergies   Patient has no known allergies.   Review of Systems Review of Systems  Constitutional: Positive for malaise/fatigue. Negative for diaphoresis and fever.  Respiratory: Negative for cough and shortness of breath.   Cardiovascular: Positive for palpitations. Negative for chest pain and PND.  Gastrointestinal: Negative for nausea and vomiting.  Neurological: Positive for headaches. Negative for dizziness.  All other systems are reviewed and are negative for acute change except as noted in the HPI    Physical  Exam Updated Vital Signs BP 113/73   Pulse 80   Temp 98.4 F (36.9 C) (Oral)   Resp (!) 25   Ht 5\' 4"  (1.626 m)   Wt 101.3 kg (223 lb 6 oz)   LMP 03/19/2017   SpO2 99%   BMI 38.34 kg/m   Physical Exam  Constitutional: She is oriented to person, place, and time. She appears well-developed and well-nourished. No distress.  HENT:  Head: Normocephalic and atraumatic.  Nose: Nose normal.  Eyes: Pupils are equal, round, and reactive to light. Conjunctivae and EOM are normal. Right eye exhibits no discharge. Left eye exhibits no discharge. No scleral icterus.  Neck: Normal range of motion. Neck supple.  Cardiovascular: Normal rate and regular rhythm.  Exam reveals no gallop and no friction rub.   No murmur heard. Pulmonary/Chest: Effort normal and breath sounds normal. No stridor. No respiratory distress. She has no rales.  Abdominal: Soft. She exhibits no distension. There is no tenderness.  Musculoskeletal: She exhibits no edema or tenderness.  Neurological: She is alert and oriented to person, place, and time.  Skin: Skin is warm and dry. No rash noted. She is not diaphoretic. No erythema.  Psychiatric: She has a normal mood and affect.  Vitals reviewed.    ED Treatments / Results  Labs (all labs ordered are listed, but only abnormal results are displayed) Labs Reviewed  BASIC METABOLIC PANEL - Abnormal; Notable for the following:       Result Value   Glucose, Bld 110 (*)    All other components within normal limits  CBC - Abnormal; Notable for the following:    Hemoglobin 9.2 (*)    HCT 31.0 (*)    MCV 67.1 (*)    MCH 19.9 (*)    MCHC 29.7 (*)    RDW 21.3 (*)    Platelets 404 (*)    All other components within normal limits  I-STAT TROPOININ, ED    EKG  EKG Interpretation  Date/Time:  Saturday March 21 2017 12:30:04 EDT Ventricular Rate:  98 PR Interval:  186 QRS Duration: 84 QT Interval:  348 QTC Calculation: 444 R Axis:   25 Text Interpretation:  Unusual P  axis, possible ectopic atrial rhythm Nonspecific T wave abnormality Abnormal ECG No significant change since last tracing Confirmed by Addison Lank [phone]) on 03/21/2017 4:54:06 PM       Radiology Dg Chest 2 View  Result Date: 03/21/2017 CLINICAL DATA:  Left side chest pain EXAM: CHEST  2 VIEW COMPARISON:  02/08/2017 FINDINGS: Heart and mediastinal contours are within normal limits. No focal opacities or effusions. No acute bony abnormality. IMPRESSION: No active cardiopulmonary disease. Electronically Signed   By: Rolm Baptise M.D.   On: 03/21/2017 13:20    Procedures Procedures (including critical care time)  Medications Ordered in ED Medications - No data to display   Initial Impression / Assessment and Plan / ED Course  I have reviewed the triage vital  signs and the nursing notes.  Pertinent labs & imaging results that were available during my care of the patient were reviewed by me and considered in my medical decision making (see chart for details).     1. Palpitations No associated chest pain or shortness of breath. Patient denied any melena or hematochezia. Just finished her menstrual cycle which started approximately one week ago. Does have a history of anemia. Patient endorses being under a lot of stress and there appears to be a component of anxiety related to her palpitations. EKG without evidence of acute ischemia or dysrhythmias. Labs without significant electrolyte derangements. Hemoglobin at patient's baseline. The patient is well-appearing, afebrile, hemodynamically stable. No evidence of infectious process. Low suspicion for thyroid storm.  2. Headache Baseline headache for patient. Currently asymptomatic. No intervention needed  3. Patient also reported intermittent eye twitching. No trauma. No visual changes. No need for workup or intervention at this time.  4. Bilateral heel pain for 2 weeks. No associated trauma. No evidence of infectious process or wounds on  exam. Patient does wear flip-flops frequently. Possible plantar fasciitis.  Patient has scheduled appointment with her primary care provider next week. Recommended that she keep this appointment.  The patient is safe for discharge with strict return precautions.   Final Clinical Impressions(s) / ED Diagnoses   Final diagnoses:  Palpitations  Pain in both feet   Disposition: Discharge  Condition: Good  I have discussed the results, Dx and Tx plan with the patient who expressed understanding and agree(s) with the plan. Discharge instructions discussed at great length. The patient was given strict return precautions who verbalized understanding of the instructions. No further questions at time of discharge.    New Prescriptions   No medications on file    Follow Up: Dr. Kalman Shan  Go on 03/26/2017 as scheduled      Cardama, Grayce Sessions, MD 03/21/17 [phone]

## 2017-03-21 NOTE — ED Triage Notes (Signed)
P[t. Stated, for 3 weeks Ive had heart beat fast sometimes, headache, foot pain and My eye twitches sometimes.

## 2017-03-21 NOTE — ED Notes (Signed)
Pt ambulated to room without difficulty

## 2017-03-21 NOTE — ED Notes (Signed)
Pt states she feels like she has had a fast heart beat for 3 weeks, bilateral heel pain for 2 weeks without injury, high BP this morning but did not take her BID blood pressure medicine today.   Hx Meniere's

## 2017-03-24 ENCOUNTER — Other Ambulatory Visit: Payer: Self-pay | Admitting: Internal Medicine

## 2017-03-24 NOTE — Telephone Encounter (Signed)
Refill Request  levothyroxine (SYNTHROID, LEVOTHROID) 25 MCG tablet

## 2017-03-25 MED ORDER — LEVOTHYROXINE SODIUM 25 MCG PO TABS: ORAL_TABLET | ORAL | 0 refills | Status: DC

## 2017-03-26 ENCOUNTER — Encounter: Payer: Self-pay | Admitting: Internal Medicine

## 2017-03-26 NOTE — Progress Notes (Deleted)
   CC: ***  HPI:  Ms.Njeri Gabriela Eves is a 31 y.o.   Past Medical History:  Diagnosis Date  . Anemia   . Anxiety   . Atrial tachycardia ([place])   . Depression   . Dizziness   . Fibroids   . Hypertension   . Hypothyroid   . Meniere's disease   . Thyrotoxicosis     Review of Systems:  ***  Physical Exam:  There were no vitals filed for this visit. ***  Assessment & Plan:   See encounters tab for problem based medical decision making.  Assessment: Mnire's disease Patient was prescribed hydrochlorothiazide on previous office visit to help with symptoms. She was also referred to PT for vestibular rehabilitation in an ENT referral  Assessment: Hypothyroidism TSH was elevated at 5.5 on 6/20.  At the time her synthroid was 14mcg and she is still at current dose  Assessment: History of atrial ectopic tachycardia Patient was seen on 620 by cardiology and atenolol was discontinued and IV Cardene 5 mg twice daily was administered.  Patient was recently seen in the ED on 7/14 for palpitations  Patient {GC/GE:[id number]::"discussed with","seen with"} Dr. {NAMES:[id number]::"Butcher","Granfortuna","E. Jene Huq","Klima","Mullen","Narendra","Vincent"}

## 2017-03-30 ENCOUNTER — Ambulatory Visit (INDEPENDENT_AMBULATORY_CARE_PROVIDER_SITE_OTHER): Payer: Self-pay | Admitting: Internal Medicine

## 2017-03-30 ENCOUNTER — Encounter: Payer: Self-pay | Admitting: Internal Medicine

## 2017-03-30 DIAGNOSIS — G8929 Other chronic pain: Secondary | ICD-10-CM

## 2017-03-30 DIAGNOSIS — I10 Essential (primary) hypertension: Secondary | ICD-10-CM

## 2017-03-30 DIAGNOSIS — M545 Low back pain, unspecified: Secondary | ICD-10-CM

## 2017-03-30 DIAGNOSIS — H8109 Meniere's disease, unspecified ear: Secondary | ICD-10-CM

## 2017-03-30 DIAGNOSIS — E039 Hypothyroidism, unspecified: Secondary | ICD-10-CM

## 2017-03-30 DIAGNOSIS — Z6838 Body mass index (BMI) 38.0-38.9, adult: Secondary | ICD-10-CM

## 2017-03-30 MED ORDER — CYCLOBENZAPRINE HCL 5 MG PO TABS: 5.0000 mg | ORAL_TABLET | Freq: Every day | ORAL | 2 refills | Status: DC

## 2017-03-30 MED ORDER — IBUPROFEN 600 MG PO TABS: 600.0000 mg | ORAL_TABLET | Freq: Three times a day (TID) | ORAL | 2 refills | Status: DC | PRN

## 2017-03-30 MED ORDER — LEVOTHYROXINE SODIUM 25 MCG PO TABS: 37.5000 ug | ORAL_TABLET | Freq: Every day | ORAL | 1 refills | Status: DC

## 2017-03-30 NOTE — Progress Notes (Signed)
   CC: follow up on hypothyroidism   HPI:  [name] is a 31 y.o. with history noted below that presents to the internal medicine clinic for follow-up of her hypothyroidism. Patient states that she takes Synthroid 25 MCG daily. She states that she has had lab work at her cardiology appointment last month that noted an elevated TSH. She is here to follow-up for her abnormal lab. She reports increase in fatigue recently. Patient also has a history of Mnire's disease and states that she was on hydrochlorothiazide but thought she was supposed to stop it at her last cardiology visit. She states while she was on the medication it had helped with her dizziness. Since being off it her dizziness has increased.  She also reports chronic back pain that has been going on for the past 2-3 years. She reports achy pain to her lower back that is nonradiating.  She denies taking anything to help with her symptoms.   Past Medical History:  Diagnosis Date  . Anemia   . Anxiety   . Atrial tachycardia ([place])   . Depression   . Dizziness   . Fibroids   . Hypertension   . Hypothyroid   . Meniere's disease   . Thyrotoxicosis     Review of Systems:  Review of Systems  Constitutional: Negative for weight loss.  Eyes: Negative for blurred vision.  Respiratory: Negative for shortness of breath.   Cardiovascular: Negative for chest pain.  Gastrointestinal: Negative for abdominal pain.  Musculoskeletal: Positive for back pain.    Physical Exam:  Vitals:   03/30/17 1336  BP: 130/76  Pulse: 92  Temp: 98.6 F (37 C)  TempSrc: Oral  SpO2: 100%  Weight: 226 lb 6.4 oz (102.7 kg)   Physical Exam  Constitutional: She is well-developed, well-nourished, and in no distress.  Neck: Normal range of motion.  Cardiovascular: Normal rate, regular rhythm and normal heart sounds.  Exam reveals no gallop and no friction rub.   No murmur heard. Pulmonary/Chest: Effort normal and breath sounds normal. No  respiratory distress. She has no wheezes. She has no rales.  Musculoskeletal: She exhibits no edema.  Paraspinal muscular tenderness and tightness to thoracic and lumbar region bilaterally 5/5 motor strength bilaterally   Skin: Skin is warm and dry.    Assessment & Plan:   See encounters tab for problem based medical decision making.    Patient discussed with Dr. Daryll Drown

## 2017-03-30 NOTE — Patient Instructions (Addendum)
[name],  It was a pleasure meeting you today. Please start taking 1-1/2 tablets of Synthroid daily. Please follow up in the clinic with me in 6 weeks to recheck her thyroid function. For your back pain please use ibuprofen.  Use Flexeril at night.

## 2017-04-02 DIAGNOSIS — M545 Low back pain, unspecified: Secondary | ICD-10-CM | POA: Insufficient documentation

## 2017-04-02 DIAGNOSIS — G8929 Other chronic pain: Secondary | ICD-10-CM | POA: Insufficient documentation

## 2017-04-02 NOTE — Assessment & Plan Note (Signed)
Assessment:  Hyperthyroidism TSH was 5.3 on 02/2017.  At the time patient was taking synthroid 2mcg. Will increase her synthroid to 37.87mcg and reassess thyroid function on next continuity visit in ~6 weeks.  Plan -increase levothyroxine from 11mcg to 37.5 mcg

## 2017-04-02 NOTE — Assessment & Plan Note (Signed)
Assessment:  Mnire's disease Patient was started on hydrochlorothiazide last month for her Mnire's disease. She stopped taking the medication after her cardiology appointment due to her blood pressure being 102/70, per patient report.  Patient states that while she was on the medication she did not feel dizzy or lightheaded.  I recommended restarting the medication and if she developed these symptoms to stop and call the clinic  Plan -continue HCTZ

## 2017-04-02 NOTE — Assessment & Plan Note (Signed)
Assessment:  Chronic lower back pain Patient reports a 2-3 year history of chronic low back pain. She denies numbness, weakness or radiating pain in her lower extremities. Patient is morbidly obese and I suspect this is contributing to her back pain. I recommended weight loss and healthy eating as this will likely help with her back pain. I offered referral to nutrition however patient declined.  On exam patient had tenderness to paraspinal musculature in the thoracic and lumbar region. I recommended taking Flexeril one pill at night and using ibuprofen as needed to help the pain.  Plan -Flexeril -Ibuprofen -Nutritional counseling-patient declined

## 2017-04-03 NOTE — Progress Notes (Signed)
Internal Medicine Clinic Attending  Case discussed with Dr. Hoffman at the time of the visit.  We reviewed the resident's history and exam and pertinent patient test results.  I agree with the assessment, diagnosis, and plan of care documented in the resident's note.  

## 2017-04-06 ENCOUNTER — Encounter: Payer: Self-pay | Admitting: Physical Therapy

## 2017-04-06 ENCOUNTER — Telehealth: Payer: Self-pay | Admitting: Physical Therapy

## 2017-04-06 ENCOUNTER — Ambulatory Visit: Payer: No Typology Code available for payment source | Attending: Internal Medicine | Admitting: Physical Therapy

## 2017-04-06 VITALS — BP 126/96 | HR 73

## 2017-04-06 DIAGNOSIS — R262 Difficulty in walking, not elsewhere classified: Secondary | ICD-10-CM

## 2017-04-06 DIAGNOSIS — R2681 Unsteadiness on feet: Secondary | ICD-10-CM

## 2017-04-06 DIAGNOSIS — R42 Dizziness and giddiness: Secondary | ICD-10-CM

## 2017-04-06 NOTE — Therapy (Signed)
[facility] [address] [place], [place], [address] Phone: [phone]   Fax:  [phone]  Physical Therapy Evaluation  Patient Details  Name: Alyssa Hall MRN: [id number] Date of Birth: [birth year] Referring Provider: Aldine Contes, MD  Encounter Date: 04/06/2017      PT End of Session - 04/06/17 1652    Visit Number 1   Number of Visits 17   Date for PT Re-Evaluation 06/05/17   Authorization Type CAFA 100%   Authorization Time Period 03/19/17 through 09/19/17   PT Start Time 1020   PT Stop Time 1103   PT Time Calculation (min) 43 min   Activity Tolerance Other (comment)  limited by dizziness and nausea/vomiting   Behavior During Therapy Anxious      Past Medical History:  Diagnosis Date  . Anemia   . Anxiety   . Atrial tachycardia ([place])   . Depression   . Dizziness   . Fibroids   . Hypertension   . Hypothyroid   . Meniere's disease   . Thyrotoxicosis     History reviewed. No pertinent surgical history.  Vitals:   04/06/17 1038  BP: (!) 126/96  Pulse: 73         Subjective Assessment - 04/06/17 1025    Subjective Pt presents to OPPT with c/o fullness in ears, dizziness, imbalance and eyes twitching and can feel heart beating in L ear; also reports when she is around loud noises it causes sharp pains in her ear.  This was an intermittent issue but now she experiences these episodes every day.  Reports diplopia and blurring of vision intermittently.  Pt also reports headaches, tinnitus, nausea and vomiting with episodes.  Also reports loss of hearing in L ear, feels like she is losing hearing in R ear.  Pt was diagnosed with Meniere's disease 5 years ago.   Patient is accompained by: Family member  fiance   Pertinent History hypothyroidism, HTN, depression, anxiety, hearing loss, Meniere's disease-although documentation from [facility] ENT does not have list definitive dx   Patient Stated  Goals To be able to get her balance back; to be able to dance at her wedding on Saturday   Currently in Pain? No/denies            [facility] PT Assessment - 04/06/17 1035      Assessment   Medical Diagnosis Vertigo   Referring Provider Aldine Contes, MD   Onset Date/Surgical Date --  pt reports 5 years ago   Next MD Visit unknown   Prior Therapy PT for dizziness; saw [facility].     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, meals and some lifting     Observation/Other Assessments   Focus on Therapeutic Outcomes (FOTO)  56 (44% limited; predicted 23% limited)   Other Surveys  Other Surveys   Dizziness Handicap Inventory [facility])  66 severe     Ambulation/Gait   Ambulation/Gait Yes   Ambulation/Gait Assistance 6: Modified independent (Device/Increase time)   Ambulation Distance (Feet) 300 Feet   Assistive device None   Gait Pattern Step-through pattern;Decreased step length - right;Decreased step length - left;Decreased stride length   Ambulation Surface Level;Indoor   Gait velocity 2.69 ft/sec     Standardized Balance Assessment   Standardized Balance Assessment  10 meter walk test   10 Meter Walk 12.15 seconds or 2.69 ft/sec            Vestibular Assessment - 04/06/17 1039      Vestibular Assessment   General Observation No evidence of imbalance initially; decreased gait velocity: 12.15 seconds or 2.69 ft/sec     Symptom Behavior   Type of Dizziness Spinning  "weight on one side", imbalance, diplopia, blurred vision   Frequency of Dizziness every day   Duration of Dizziness 10-15 minutes   Aggravating Factors Turning head quickly;Turning body quickly  walking, heights   Relieving Factors No known relieving factors     Occulomotor Exam   Occulomotor Alignment Normal    Spontaneous Absent   Gaze-induced Absent   Smooth Pursuits Intact  pt pt very symptomatic   Saccades Comment  pt very symptomatic   Comment convergence impaired and pt symptomatic     Vestibulo-Occular Reflex   VOR 1 Head Only (x 1 viewing) impaired-caused pt to become nauseous and vomit   Comment HIT: + to L     Other Tests   Tragal no nystagmus but pt reported dizziness with L pressure   Comments Valsalva: pt reported pressure and dizziness, no nystagmus noted     Orthostatics   BP sitting 126/96   HR sitting 73        Objective measurements completed on examination: See above findings.                  PT Education - 04/06/17 1651    Education provided Yes   Education Details clinical findings, PT POC, goals of therapy   Person(s) Educated Patient;Spouse   Methods Explanation   Comprehension Verbalized understanding          PT Short Term Goals - 04/06/17 1702      PT SHORT TERM GOAL #1   Title Pt will tolerate and perform vestibular habituation, adaptation and balance HEP with supervision    Time 4   Period Weeks   Status New   Target Date 05/06/17     PT SHORT TERM GOAL #2   Title Pt will tolerate further vestibular assessment: FGA, MSQ, DVA with LTG to be set   Time 4   Period Weeks   Status New   Target Date 05/06/17     PT SHORT TERM GOAL #3   Title Pt will decreased falls risk during community ambulation as indicated by gait velocity >3.0 ft/sec   Baseline 2.69 ft/sec   Time 4   Period Weeks   Status New   Target Date 05/06/17     PT SHORT TERM GOAL #4   Title Pt will report dizziness <5/10 while performing daily tasks at home and at work   Baseline 10/10 with vomiting   Time 4   Period Weeks   Status New   Target Date 05/06/17           PT Long Term Goals - 04/06/17 1707      PT LONG TERM GOAL #1   Title Pt will tolerate and be independent with vestibular and balance HEP   Time 8   Period Weeks   Status New    Target Date 06/05/17     PT LONG TERM GOAL #2   Title Pt will decrease falls risk during community gait as indicated by FGA score of > or = 23/30   Baseline TBD   Time 8   Period Weeks  Status New   Target Date 06/05/17     PT LONG TERM GOAL #3   Title Pt will have decreased motion sensitivity and will report mild to no dizziness with varying movements on MSQ    Baseline TBD   Time 8   Period Weeks   Status New   Target Date 06/05/17     PT LONG TERM GOAL #4   Title Pt will improve gait velocity to >3.5 ft/sec for improved independence with community ambulation   Baseline 2.69 ft/sec   Time 8   Period Weeks   Status New   Target Date 06/05/17     PT LONG TERM GOAL #5   Title Pt will improve DHI by 18 points to indicate decrease severity of symptoms   Baseline 66 severe   Time 8   Period Weeks   Status New   Target Date 06/05/17     Additional Long Term Goals   Additional Long Term Goals Yes     PT LONG TERM GOAL #6   Title DVA goal if indicated                Plan - 04/06/17 1653    Clinical Impression Statement Pt is a 31 year old female presenting to OPPT neuro for medium complexity PT evaluation for vertigo and imbalance. Pt's PMH significant for the following: Meniere's disease, hypothyroidism, HTN, depression, anxiety and tachycardia.  Pt was diagnosed with hearing loss and Meniere's disease x 5 years ago and was referred for vestibular rehab where she only completed 5 session and then was not able to return due to insurance and financial concerns.  Pt was placed on HCTZ (with diuretic) to manage Meniere's disease but was recently taken off of it due to low BP.  Pt reports dizziness increased after ceasing HCTZ.  Per MD note HCTZ was resumed at last visit due to improved BP.  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  Pt also presented with multiple audiological symptoms including hearing her heart beat in her L ear, dizziness with loud noises, dizziness with tragal pressure on L and with Valsalva maneuver-no nystagmus noted when fixation blocked with Frenzel lenses.  Pt's gait speed of 2.69 ft/sec indicates she is appropriate for limited community distances but is below normal for age.  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 Pt's condition is evolving and would benefit from skilled PT to address these impairments and functional limitations to maximize functional mobility independence and reduce falls risk.   History and Personal Factors relevant to plan of care: severe vertigo and motion sensitivity, job duties as caregiver include performing multiple household tasks and lifting, slowed gait velocity and increased falls risk   Clinical Presentation Evolving   Clinical Presentation due to: severe vertigo and motion sensitivity, job duties as caregiver include performing multiple household tasks and lifting, slowed gait velocity and increased falls risk   Clinical Decision Making Moderate   Rehab Potential Good   Clinical Impairments Affecting Rehab Potential unable to complete previous vestibular rehab due to insurance/financial isues   PT Frequency 2x / week   PT Duration 8 weeks   PT Treatment/Interventions ADLs/Self Care Home Management;Canalith Repostioning;Gait training;Stair training;Functional mobility training;Therapeutic activities;Therapeutic exercise;Balance training;Neuromuscular re-education;Patient/family education;Vestibular;Visual/perceptual remediation/compensation  PT Next Visit Plan (Pt is very fearful of SOT!) if pt able to tolerate perform FGA and set goal; MSQ and DVA and set goal.  Begin gentle habituation program   PT Home Exercise  Plan habituation, gaze adaptation   Recommended Other Services follow up with ENT to confirm Meniere's diagnosis, has symptoms of superior canal deh.   Consulted and Agree with Plan of Care Patient      Patient will benefit from skilled therapeutic intervention in order to improve the following deficits and impairments:  Decreased balance, Difficulty walking, Dizziness  Visit Diagnosis: Dizziness and giddiness  Unsteadiness on feet  Difficulty in walking, not elsewhere classified     Problem List Patient Active Problem List   Diagnosis Date Noted  . Chronic low back pain 04/02/2017  . Muscle strain 03/28/2015  . Atrial ectopic tachycardia ([place]) 12/06/2014  . Healthcare maintenance 05/27/2013  . Meniere's disease 07/12/2012  . Dysmenorrhea 05/05/2012  . Iron deficiency anemia 02/04/2012  . Hypothyroidism (acquired) 01/14/2012  . Generalized anxiety disorder 01/14/2012    Raylene Everts, PT, DPT 04/06/17    5:20 PM    [facility] [address] [place], [place], [address] Phone: [phone]   Fax:  [phone]  Name: Hideko Esselman MRN: [id number] Date of Birth: 1986-02-16

## 2017-04-06 NOTE — Telephone Encounter (Signed)
Hello Dr. Dareen Piano, Alyssa Hall was evaluated for vestibular rehabilitation due to h/o Meniere's disease.  In addition to the typical Meniere's presentation this pt also had multiple audiologic symptoms that are not typical for Meniere's disease.  She reported being able to hear her heart beat in her left ear and reported that loud noises caused dizziness and ear pain. With tragal pressure and valsalva maneuver the patient reported significant dizziness although no nystagmus was observed with frenzel lenses.  Due to this presentation this patient may benefit from further testing to rule out superior canal dehiscence.    Thank you for your referral of this patient and your assistance, Raylene Everts, PT, DPT 04/06/17    9:20 PM

## 2017-04-07 NOTE — Telephone Encounter (Signed)
Alyssa Hall.. Can you please refer this patient to ENT for further evaluation of this?

## 2017-04-08 ENCOUNTER — Other Ambulatory Visit: Payer: Self-pay | Admitting: Internal Medicine

## 2017-04-08 DIAGNOSIS — H8109 Meniere's disease, unspecified ear: Secondary | ICD-10-CM

## 2017-04-08 NOTE — Telephone Encounter (Signed)
Yes.  Referral has been placed. Thanks

## 2017-04-13 ENCOUNTER — Ambulatory Visit: Payer: No Typology Code available for payment source | Attending: Internal Medicine | Admitting: Physical Therapy

## 2017-04-13 ENCOUNTER — Encounter: Payer: Self-pay | Admitting: Physical Therapy

## 2017-04-13 DIAGNOSIS — R262 Difficulty in walking, not elsewhere classified: Secondary | ICD-10-CM | POA: Insufficient documentation

## 2017-04-13 DIAGNOSIS — R2681 Unsteadiness on feet: Secondary | ICD-10-CM | POA: Insufficient documentation

## 2017-04-13 DIAGNOSIS — R42 Dizziness and giddiness: Secondary | ICD-10-CM

## 2017-04-13 NOTE — Therapy (Signed)
[facility] [address] [place], [place], [address] Phone: [phone]   Fax:  [phone]  Physical Therapy Treatment  Patient Details  Name: Alyssa Hall MRN: [id number] Date of Birth: Feb 20, 1986 Referring Provider: Aldine Contes, MD  Encounter Date: 04/13/2017      PT End of Session - 04/13/17 0937    Visit Number 2   Number of Visits 17   Date for PT Re-Evaluation 06/05/17   Authorization Type CAFA, [employer] 100% covered   Authorization Time Period 03/19/17 through 09/19/17   PT Start Time 0853  pt arrived late   PT Stop Time 0930   PT Time Calculation (min) 37 min   Activity Tolerance Other (comment)  limited by dizziness and nausea/vomiting   Behavior During Therapy Anxious      Past Medical History:  Diagnosis Date  . Anemia   . Anxiety   . Atrial tachycardia ([place])   . Depression   . Dizziness   . Fibroids   . Hypertension   . Hypothyroid   . Meniere's disease   . Thyrotoxicosis     History reviewed. No pertinent surgical history.  There were no vitals filed for this visit.      Subjective Assessment - 04/13/17 0858    Subjective Pt got married this past weekend-pt reports everything went well.  She did dance a lot and did get dizzy but was able to hold on to her husband for support.  Reports hearing heart beat in ear has lessened some.     Patient is accompained by: Family member   Pertinent History hypothyroidism, HTN, depression, anxiety, hearing loss, Meniere's disease-although documentation from [facility] ENT does not have list definitive dx   Patient Stated Goals To be able to get her balance back; to be able to dance at her wedding on Saturday   Currently in Pain? No/denies            [facility] PT Assessment - 04/13/17 0859      Functional Gait  Assessment   Gait assessed  Yes   Gait Level Surface Walks 20 ft in less than 7 sec but greater than 5.5 sec, uses assistive  device, slower speed, mild gait deviations, or deviates 6-10 in outside of the 12 in walkway width.   Change in Gait Speed Able to change speed, demonstrates mild gait deviations, deviates 6-10 in outside of the 12 in walkway width, or no gait deviations, unable to achieve a major change in velocity, or uses a change in velocity, or uses an assistive device.   Gait with Horizontal Head Turns Performs head turns with moderate changes in gait velocity, slows down, deviates 10-15 in outside 12 in walkway width but recovers, can continue to walk.   Gait with Vertical Head Turns Performs task with severe disruption of gait (eg, staggers 15 in outside 12 in walkway width, loses balance, stops, reaches for wall).   Gait and Pivot Turn Turns slowly, requires verbal cueing, or requires several small steps to catch balance following turn and stop   Step Over Obstacle Cannot perform without assistance.   Gait with Narrow Base of Support Ambulates 4-7 steps.   Gait with Eyes Closed Cannot walk 20 ft without assistance, severe gait deviations or imbalance, deviates greater than 15 in outside 12 in walkway width or will not attempt task.   Ambulating Backwards Walks 20 ft, uses assistive device, slower speed, mild gait deviations, deviates 6-10 in outside 12 in  walkway width.   Steps Alternating feet, must use rail.   Total Score 10   FGA comment: 10/30            Vestibular Assessment - 04/13/17 0914      Positional Sensitivities   Nose to Right Knee Moderate dizziness   Right Knee to Sitting Moderate dizziness   Nose to Left Knee Moderate dizziness   Left Knee to Sitting Moderate dizziness   Head Turning x 5 Vomiting   Head Nodding x 5 Vomiting   Pivot Right in Standing Moderate dizziness   Pivot Left in Standing Moderate dizziness                  Vestibular Treatment/Exercise - 04/13/17 0919      Vestibular Treatment/Exercise   Habituation Exercises Comment  corrective saccades  below   Gaze Exercises Eye/Head Exercise Horizontal;Eye/Head Exercise Vertical     Eye/Head Exercise Horizontal   Foot Position seated   Reps 5     Eye/Head Exercise Vertical   Foot Position seated   Reps 5   Comments x 2 reps, required rest breaks between sets due to dizziness and pressure during vertical                PT Education - 04/13/17 0935    Education provided Yes   Education Details falls risk based on FGA; pt asking if it is safe to continue to exercise-discussed safety precautions with walking program-not walking alone, walk with plenty of light, level surfaces and in the cool part of the day and to stay hydrated.  Also discussed use of stationary bike at the gym-advised to have cool down period and to sit on bike for a few minutes before transferring off.  Compensatory saccades for habituation.   Person(s) Educated Patient;Spouse   Methods Explanation;Demonstration;Handout   Comprehension Verbalized understanding;Returned demonstration;Verbal cues required          PT Short Term Goals - 04/13/17 0943      PT SHORT TERM GOAL #1   Title Pt will tolerate and perform vestibular habituation, adaptation and balance HEP with supervision    Time 4   Period Weeks   Status New   Target Date 05/06/17     PT SHORT TERM GOAL #2   Title Pt will tolerate further vestibular assessment: FGA, MSQ, DVA with LTG to be set   Baseline needs to perform DVA when able to tolerate   Time 4   Period Weeks   Status Partially Met   Target Date 05/06/17     PT SHORT TERM GOAL #3   Title Pt will decreased falls risk during community ambulation as indicated by gait velocity >3.0 ft/sec   Baseline 2.69 ft/sec   Time 4   Period Weeks   Status New   Target Date 05/06/17     PT SHORT TERM GOAL #4   Title Pt will report dizziness <5/10 while performing daily tasks at home and at work   Baseline 10/10 with vomiting   Time 4   Period Weeks   Status New   Target Date 05/06/17            PT Long Term Goals - 04/13/17 0944      PT LONG TERM GOAL #1   Title Pt will tolerate and be independent with vestibular and balance HEP   Time 8   Period Weeks   Status New   Target Date 06/05/17     PT  LONG TERM GOAL #2   Title Pt will decrease falls risk during community gait as indicated by FGA score of > or = 23/30   Baseline 10/30   Time 8   Period Weeks   Status New   Target Date 06/05/17     PT LONG TERM GOAL #3   Title Pt will have decreased motion sensitivity and will report mild to no dizziness with varying movements on MSQ    Baseline moderate to severe with vomiting   Time 8   Period Weeks   Status New   Target Date 06/05/17     PT LONG TERM GOAL #4   Title Pt will improve gait velocity to >3.5 ft/sec for improved independence with community ambulation   Baseline 2.69 ft/sec   Time 8   Period Weeks   Status New     PT LONG TERM GOAL #5   Title Pt will improve DHI by 18 points to indicate decrease severity of symptoms   Baseline 66 severe   Time 8   Period Weeks   Status New     PT LONG TERM GOAL #6   Title DVA goal if indicated               Plan - 04/13/17 5188    Clinical Impression Statement Continued assessment of vestibular system and balance with FGA and MSQ with pt unable to tolerate full MSQ-pt vomited after vertical head nods x 5.  Pt demonstrates increased falls risk with dynamic gait tasks as indicated by FGA score of 10/30.  Due to significant motion sensitivity pt provided with habituation and compesatory exercise of compensatory saccades in sitting.  Pt only able to tolerate 5 reps currently. Will continue to address and progress as pt tolerates.     Rehab Potential Good   Clinical Impairments Affecting Rehab Potential unable to complete previous vestibular rehab due to insurance/financial isues   PT Frequency 2x / week   PT Duration 8 weeks   PT Treatment/Interventions ADLs/Self Care Home Management;Canalith  Repostioning;Gait training;Stair training;Functional mobility training;Therapeutic activities;Therapeutic exercise;Balance training;Neuromuscular re-education;Patient/family education;Vestibular;Visual/perceptual remediation/compensation   PT Next Visit Plan (Pt is very fearful of SOT and very symptomatic right now-too sensitive for SOT!) review compensatory saccades and check technique, teach how to use compensatory saccades during rolling, supine <> sit, turning; when able to tolerate perform DVA and set goal.    PT Home Exercise Plan habituation, gaze adaptation   Consulted and Agree with Plan of Care Patient      Patient will benefit from skilled therapeutic intervention in order to improve the following deficits and impairments:  Decreased balance, Difficulty walking, Dizziness  Visit Diagnosis: Dizziness and giddiness  Unsteadiness on feet  Difficulty in walking, not elsewhere classified     Problem List Patient Active Problem List   Diagnosis Date Noted  . Chronic low back pain 04/02/2017  . Muscle strain 03/28/2015  . Atrial ectopic tachycardia ([place]) 12/06/2014  . Healthcare maintenance 05/27/2013  . Meniere's disease 07/12/2012  . Dysmenorrhea 05/05/2012  . Iron deficiency anemia 02/04/2012  . Hypothyroidism (acquired) 01/14/2012  . Generalized anxiety disorder 01/14/2012    Raylene Everts, PT, DPT 04/13/17    9:47 AM    [facility] [address] [place], [place], [address] Phone: [phone]   Fax:  [phone]  Name: Alyssa Hall MRN: [id number] Date of Birth: [birth year]

## 2017-04-13 NOTE — Patient Instructions (Addendum)
Compensatory Strategies: Corrective Saccades    1. Holding two stationary targets placed __12__ inches apart-taped to the back of the door (side to side and up/down), move eyes to target, keep head still. 2. Then move head in direction of target while eyes remain on target. 3/4. Repeat in opposite direction. Perform sitting. Repeat sequence __5__ times each direction per session. Do __2__ sessions per day.  Copyright  VHI. All rights reserved.

## 2017-04-17 ENCOUNTER — Ambulatory Visit: Payer: No Typology Code available for payment source | Admitting: Physical Therapy

## 2017-04-20 ENCOUNTER — Ambulatory Visit: Payer: No Typology Code available for payment source | Admitting: Physical Therapy

## 2017-04-20 DIAGNOSIS — R2681 Unsteadiness on feet: Secondary | ICD-10-CM

## 2017-04-20 DIAGNOSIS — R42 Dizziness and giddiness: Secondary | ICD-10-CM

## 2017-04-20 NOTE — Patient Instructions (Signed)
Gaze Stabilization: Sitting    Keeping eyes on target on wall \\_10N  feet away, tilt head down 15-30 and move head side to side for _60___ seconds. Repeat while moving head up and down for _60___ seconds. Do _3-5___ sessions per day. Gaze Stabilization: Tip Card  1.Target must remain in focus, not blurry, and appear stationary while head is in motion. 2.Perform exercises with small head movements (45 to either side of midline). 3.Increase speed of head motion so long as target is in focus. 4.If you wear eyeglasses, be sure you can see target through lens (therapist will give specific instructions for bifocal / progressive lenses). 5.These exercises may provoke dizziness or nausea. Work through these symptoms. If too dizzy, slow head movement slightly. Rest between each exercise. 6.Exercises demand concentration; avoid distractions. 7.For safety, perform standing exercises close to a counter, wall, corner, or next to someone.  Copyright  VHI. All rights reserved.    Copyright  VHI. All rights reserved.

## 2017-04-20 NOTE — Therapy (Signed)
[facility] [address] [place], [place], [address] Phone: [phone]   Fax:  [phone]  Physical Therapy Treatment  Patient Details  Name: Alyssa Hall MRN: [id number] Date of Birth: Jul 04, 1986 Referring Provider: Aldine Contes, MD  Encounter Date: 04/20/2017      PT End of Session - 04/20/17 1945    Visit Number 3   Number of Visits 17   Date for PT Re-Evaluation 06/05/17   Authorization Type [employer], [employer] 100% covered   Authorization Time Period 03/19/17 through 09/19/17   PT Start Time 0850   PT Stop Time 0930   PT Time Calculation (min) 40 min      Past Medical History:  Diagnosis Date  . Anemia   . Anxiety   . Atrial tachycardia ([place])   . Depression   . Dizziness   . Fibroids   . Hypertension   . Hypothyroid   . Meniere's disease   . Thyrotoxicosis     No past surgical history on file.  There were no vitals filed for this visit.      Subjective Assessment - 04/20/17 1940    Subjective Pt states that she is unsteady when she walks - she feels she goes to one side. states her left eye twitches alot, especially when she is nervous   Pertinent History hypothyroidism, HTN, depression, anxiety, hearing loss, Meniere's disease-although documentation from [facility]NT does not have list definitive dx   Patient Stated Goals To be able to get her balance back; to be able to dance at her wedding on Saturday   Currently in Pain? No/denies         NeuroRe-ed:  Marketing executive Test attempted at pt's request and insistence on trying this test:  Pt unable to tolerate test Due to c/o nausea and quickly getting off of Balance Master due to feeling sick (running to restroom)  Condition 1 = Trials 1 and 2  WNL's                        Trial 3 slightly below Normal  Condition 2 = all 3 trials below Normal  Condition 3 =  FALL on trial 1:  Trials 1 and 2 below Normal  Condition 4 =  FALL on trial 1:  Unable to tolerate further testing after this trial  Compared test result today with previous 2 SOT results completed in August 2016  Self care:  Reviewed compensatory technique of moving eyes then head during transitional movements of rolling, supine to sidelying to sitting position  Pt instructed in x1 viewing exercise  - instructed to perform in sitting - ( was instructed in this exercise during previous OP PT in 2016)                   Vestibular Treatment/Exercise - 04/20/17 0906      Vestibular Treatment/Exercise   Vestibular Treatment Provided Habituation   Habituation Exercises Comment  sit to sidelying; c/o dizziness with return to upright   Gaze Exercises X1 Viewing Horizontal;Comment  seated position               PT Education - 04/20/17 1944    Education provided Yes   Education Details compensatory technique discussed with turning head, rolling and with supine to sidelying to sitting:  instructed in x1 viewing exercise   Person(s) Educated Patient   Methods Explanation;Demonstration;Handout   Comprehension Verbalized understanding  PT Short Term Goals - 04/13/17 0943      PT SHORT TERM GOAL #1   Title Pt will tolerate and perform vestibular habituation, adaptation and balance HEP with supervision    Time 4   Period Weeks   Status New   Target Date 05/06/17     PT SHORT TERM GOAL #2   Title Pt will tolerate further vestibular assessment: FGA, MSQ, DVA with LTG to be set   Baseline needs to perform DVA when able to tolerate   Time 4   Period Weeks   Status Partially Met   Target Date 05/06/17     PT SHORT TERM GOAL #3   Title Pt will decreased falls risk during community ambulation as indicated by gait velocity >3.0 ft/sec   Baseline 2.69 ft/sec   Time 4   Period Weeks   Status New   Target Date 05/06/17     PT SHORT TERM GOAL #4   Title Pt will report dizziness <5/10 while performing daily tasks at home  and at work   Baseline 10/10 with vomiting   Time 4   Period Weeks   Status New   Target Date 05/06/17           PT Long Term Goals - 04/13/17 0944      PT LONG TERM GOAL #1   Title Pt will tolerate and be independent with vestibular and balance HEP   Time 8   Period Weeks   Status New   Target Date 06/05/17     PT LONG TERM GOAL #2   Title Pt will decrease falls risk during community gait as indicated by FGA score of > or = 23/30   Baseline 10/30   Time 8   Period Weeks   Status New   Target Date 06/05/17     PT LONG TERM GOAL #3   Title Pt will have decreased motion sensitivity and will report mild to no dizziness with varying movements on MSQ    Baseline moderate to severe with vomiting   Time 8   Period Weeks   Status New   Target Date 06/05/17     PT LONG TERM GOAL #4   Title Pt will improve gait velocity to >3.5 ft/sec for improved independence with community ambulation   Baseline 2.69 ft/sec   Time 8   Period Weeks   Status New     PT LONG TERM GOAL #5   Title Pt will improve DHI by 18 points to indicate decrease severity of symptoms   Baseline 66 severe   Time 8   Period Weeks   Status New     PT LONG TERM GOAL #6   Title DVA goal if indicated               Plan - 04/20/17 1946    Clinical Impression Statement Pt unable to tolerate DVA testing due to provocation of symptoms with passive head turning:  pt requested to try SOT but became nauseaus after trial 1 on condition, having to run to bathroom due to feeling sick:  pt very sensitive with any motion, unable to tolerate movement    Clinical Impairments Affecting Rehab Potential unable to complete previous vestibular rehab due to insurance/financial isues   PT Frequency 2x / week   PT Duration 8 weeks   PT Treatment/Interventions ADLs/Self Care Home Management;Canalith Repostioning;Gait training;Stair training;Functional mobility training;Therapeutic activities;Therapeutic exercise;Balance  training;Neuromuscular re-education;Patient/family education;Vestibular;Visual/perceptual remediation/compensation   PT Next Visit  Plan check x1 viewing exercise; pt unable to tolerate DVA testing on 04-20-17;  - perform and set goal if pt able to tolerate: review compensatory saccades during transitional movements   PT Home Exercise Plan habituation, gaze adaptation   Consulted and Agree with Plan of Care Patient      Patient will benefit from skilled therapeutic intervention in order to improve the following deficits and impairments:     Visit Diagnosis: Dizziness and giddiness  Unsteadiness on feet     Problem List Patient Active Problem List   Diagnosis Date Noted  . Chronic low back pain 04/02/2017  . Muscle strain 03/28/2015  . Atrial ectopic tachycardia ([place]) 12/06/2014  . Healthcare maintenance 05/27/2013  . Meniere's disease 07/12/2012  . Dysmenorrhea 05/05/2012  . Iron deficiency anemia 02/04/2012  . Hypothyroidism (acquired) 01/14/2012  . Generalized anxiety disorder 01/14/2012    Alda Lea, PT 04/20/2017, 7:54 PM  [facility] [address] [place], [place], [address] Phone: [phone]   Fax:  [phone]  Name: Alyssa Hall MRN: [id number] Date of Birth: 01-Aug-1986

## 2017-04-23 ENCOUNTER — Ambulatory Visit (INDEPENDENT_AMBULATORY_CARE_PROVIDER_SITE_OTHER): Payer: No Typology Code available for payment source | Admitting: Internal Medicine

## 2017-04-23 ENCOUNTER — Encounter: Payer: Self-pay | Admitting: Internal Medicine

## 2017-04-23 ENCOUNTER — Other Ambulatory Visit (HOSPITAL_COMMUNITY)
Admission: RE | Admit: 2017-04-23 | Discharge: 2017-04-23 | Disposition: A | Discharge status: Home / Self Care | Payer: No Typology Code available for payment source | Source: Ambulatory Visit | Attending: Internal Medicine | Admitting: Internal Medicine

## 2017-04-23 VITALS — BP 122/69 | HR 91 | Temp 98.3°F | Wt 225.1 lb

## 2017-04-23 DIAGNOSIS — B9689 Other specified bacterial agents as the cause of diseases classified elsewhere: Secondary | ICD-10-CM | POA: Insufficient documentation

## 2017-04-23 DIAGNOSIS — N898 Other specified noninflammatory disorders of vagina: Secondary | ICD-10-CM

## 2017-04-23 DIAGNOSIS — R300 Dysuria: Secondary | ICD-10-CM

## 2017-04-23 NOTE — Progress Notes (Signed)
   CC: vaginal discharge  HPI:  [name] is a 31 y.o. female who presents with a 2 week history of vaginal discharge with fish like odor with associated symptoms of dysuria. Patient states that the symptoms have been stable over the past 2 weeks. She denies any vaginal itching. She states that her last menstrual period was 2 weeks ago.    Past Medical History:  Diagnosis Date  . Anemia   . Anxiety   . Atrial tachycardia ([place])   . Depression   . Dizziness   . Fibroids   . Hypertension   . Hypothyroid   . Meniere's disease   . Thyrotoxicosis     Review of Systems:  Review of Systems  Constitutional: Negative for chills and fever.  Respiratory: Negative for shortness of breath.   Cardiovascular: Negative for chest pain.  Neurological: Negative for dizziness and headaches.     Physical Exam:  Vitals:   04/23/17 1616  BP: 122/69  Pulse: 91  Temp: 98.3 F (36.8 C)  TempSrc: Oral  SpO2: 100%  Weight: 225 lb 1.6 oz (102.1 kg)   Physical Exam  Constitutional: She is well-developed, well-nourished, and in no distress.  Abdominal: Soft. She exhibits no distension. There is no tenderness.  Genitourinary: Vagina normal.  Genitourinary Comments: White discharge in the vaginal vault.  Minimal amount of blood outside the cervix  Musculoskeletal: She exhibits no edema.  Skin: Skin is warm and dry.    Assessment & Plan:   See encounters tab for problem based medical decision making.    Patient discussed with Dr. Daryll Drown

## 2017-04-23 NOTE — Patient Instructions (Signed)
Ms. Gerene,  It was a pleasure seeing you today.  Congratulations on your wedding! Very happy for you.  Please follow up with me in 3 months.  I will call you with the results of your exam.

## 2017-04-24 ENCOUNTER — Ambulatory Visit: Payer: No Typology Code available for payment source | Admitting: Physical Therapy

## 2017-04-24 DIAGNOSIS — R2681 Unsteadiness on feet: Secondary | ICD-10-CM

## 2017-04-24 DIAGNOSIS — R262 Difficulty in walking, not elsewhere classified: Secondary | ICD-10-CM

## 2017-04-24 DIAGNOSIS — R42 Dizziness and giddiness: Secondary | ICD-10-CM

## 2017-04-24 LAB — CERVICOVAGINAL ANCILLARY ONLY
BACTERIAL VAGINITIS: POSITIVE — AB
Candida vaginitis: NEGATIVE
Chlamydia: NEGATIVE
Neisseria Gonorrhea: NEGATIVE
Trichomonas: NEGATIVE

## 2017-04-24 NOTE — Therapy (Signed)
[facility] [address] [place], [place], [address] Phone: [phone]   Fax:  [phone]  Physical Therapy Treatment  Patient Details  Name: Alyssa Hall MRN: [id number] Date of Birth: 1986/03/13 Referring Provider: Aldine Contes, MD  Encounter Date: 04/24/2017      PT End of Session - 04/24/17 1306    Visit Number 4   Number of Visits 17   Date for PT Re-Evaluation 06/05/17   Authorization Type [employer], [employer] 100% covered   Authorization Time Period 03/19/17 through 09/19/17   PT Start Time 1020   PT Stop Time 1102   PT Time Calculation (min) 42 min   Activity Tolerance Patient tolerated treatment well   Behavior During Therapy Anxious      Past Medical History:  Diagnosis Date  . Anemia   . Anxiety   . Atrial tachycardia ([place])   . Depression   . Dizziness   . Fibroids   . Hypertension   . Hypothyroid   . Meniere's disease   . Thyrotoxicosis     No past surgical history on file.  There were no vitals filed for this visit.      Subjective Assessment - 04/24/17 1025    Subjective After left Monday took an hour long nap and felt better. No more reports of nausea, but still having balance issues. During the day she feels it more, especially as the day goes on.   Pertinent History hypothyroidism, HTN, depression, anxiety, hearing loss, Meniere's disease-although documentation from [facility] does not have list definitive dx   Patient Stated Goals To be able to get her balance back; to be able to dance at her wedding on Saturday   Currently in Pain? Yes   Pain Score 7    Pain Location --  B posterior LEs from knees to heels   Pain Orientation Right;Left   Pain Descriptors / Indicators Numbness;Burning;Aching   Pain Onset 1 to 4 weeks ago                          Vestibular Treatment/Exercise - 04/24/17 1058      Vestibular Treatment/Exercise   Vestibular  Treatment Provided Habituation   Habituation Exercises Horizontal Roll;Comment  supine horizontal head turns and vertical head turns     Horizontal Roll   Number of Reps  10   Symptom Description  Performed with use of letter for visual target on wall to L and having pt perform eye and head movement first before rolling supine <> L sidelying to stabilize gaze prior to moving body.  Pt continued to feel nauseous so returned pt to supine and donned head lamp where pt performed guided head nods and head turns moving light from head lamp from one square on ceiling to another up/down, side to side and diagonals x 10 reps each.  In between sets pt required extra time to rest, close eyes and perform deep breathing to decrease nausea.              Balance Exercises - 04/24/17 1304      Balance Exercises: Standing   Standing Eyes Closed Narrow base of support (BOS);Solid surface;3 reps;10 secs  visualizing upright           PT Education - 04/24/17 1304    Education provided Yes   Education Details breathing/grounding for down regulation of system when pt feels onset of symptoms   Person(s) Educated  Patient   Methods Explanation;Demonstration   Comprehension Verbalized understanding          PT Short Term Goals - 04/13/17 0943      PT SHORT TERM GOAL #1   Title Pt will tolerate and perform vestibular habituation, adaptation and balance HEP with supervision    Time 4   Period Weeks   Status New   Target Date 05/06/17     PT SHORT TERM GOAL #2   Title Pt will tolerate further vestibular assessment: FGA, MSQ, DVA with LTG to be set   Baseline needs to perform DVA when able to tolerate   Time 4   Period Weeks   Status Partially Met   Target Date 05/06/17     PT SHORT TERM GOAL #3   Title Pt will decreased falls risk during community ambulation as indicated by gait velocity >3.0 ft/sec   Baseline 2.69 ft/sec   Time 4   Period Weeks   Status New   Target Date 05/06/17      PT SHORT TERM GOAL #4   Title Pt will report dizziness <5/10 while performing daily tasks at home and at work   Baseline 10/10 with vomiting   Time 4   Period Weeks   Status New   Target Date 05/06/17           PT Long Term Goals - 04/13/17 0944      PT LONG TERM GOAL #1   Title Pt will tolerate and be independent with vestibular and balance HEP   Time 8   Period Weeks   Status New   Target Date 06/05/17     PT LONG TERM GOAL #2   Title Pt will decrease falls risk during community gait as indicated by FGA score of > or = 23/30   Baseline 10/30   Time 8   Period Weeks   Status New   Target Date 06/05/17     PT LONG TERM GOAL #3   Title Pt will have decreased motion sensitivity and will report mild to no dizziness with varying movements on MSQ    Baseline moderate to severe with vomiting   Time 8   Period Weeks   Status New   Target Date 06/05/17     PT LONG TERM GOAL #4   Title Pt will improve gait velocity to >3.5 ft/sec for improved independence with community ambulation   Baseline 2.69 ft/sec   Time 8   Period Weeks   Status New     PT LONG TERM GOAL #5   Title Pt will improve DHI by 18 points to indicate decrease severity of symptoms   Baseline 66 severe   Time 8   Period Weeks   Status New     PT LONG TERM GOAL #6   Title DVA goal if indicated               Plan - 04/24/17 1306    Clinical Impression Statement Treatment session focused on simple, gentle habituation exercises in supine and use of grounding and breathing for down regulation of system when pt felt onset of nausea to allow pt to improve activity tolerance and tolerance of head movement.  Initiated static standing balance training in corner with narrow BOS and EC but without head turns for now; pt very anxious with EC and again required verbal cues to release mm tension in shoulders and to perform breathing.  Pt did not experience vomiting today.  Will continue to progress as pt  tolerates.   Rehab Potential Good   Clinical Impairments Affecting Rehab Potential unable to complete previous vestibular rehab due to insurance/financial isues   PT Frequency 2x / week   PT Duration 8 weeks   PT Treatment/Interventions ADLs/Self Care Home Management;Canalith Repostioning;Gait training;Stair training;Functional mobility training;Therapeutic activities;Therapeutic exercise;Balance training;Neuromuscular re-education;Patient/family education;Vestibular;Visual/perceptual remediation/compensation   PT Next Visit Plan check x1 viewing exercise; review compensatory saccades during transitional movements, gentle habituation exercises   PT Home Exercise Plan habituation, gaze adaptation   Recommended Other Services has pt set up appt with ENT?   Consulted and Agree with Plan of Care Patient      Patient will benefit from skilled therapeutic intervention in order to improve the following deficits and impairments:  Decreased balance, Difficulty walking, Dizziness  Visit Diagnosis: Dizziness and giddiness  Unsteadiness on feet  Difficulty in walking, not elsewhere classified     Problem List Patient Active Problem List   Diagnosis Date Noted  . Chronic low back pain 04/02/2017  . Muscle strain 03/28/2015  . Atrial ectopic tachycardia ([place]) 12/06/2014  . Healthcare maintenance 05/27/2013  . Meniere's disease 07/12/2012  . Dysmenorrhea 05/05/2012  . Iron deficiency anemia 02/04/2012  . Hypothyroidism (acquired) 01/14/2012  . Generalized anxiety disorder 01/14/2012   Raylene Everts, PT, DPT 04/24/17    1:10 PM    [facility] [address] [place], [place], [address] Phone: [phone]   Fax:  [phone]  Name: Alyssa Hall MRN: [id number] Date of Birth: [birth year]

## 2017-04-27 ENCOUNTER — Ambulatory Visit: Payer: No Typology Code available for payment source | Admitting: Physical Therapy

## 2017-04-27 ENCOUNTER — Encounter: Payer: Self-pay | Admitting: Physical Therapy

## 2017-04-27 DIAGNOSIS — N898 Other specified noninflammatory disorders of vagina: Secondary | ICD-10-CM | POA: Insufficient documentation

## 2017-04-27 DIAGNOSIS — R262 Difficulty in walking, not elsewhere classified: Secondary | ICD-10-CM

## 2017-04-27 DIAGNOSIS — R42 Dizziness and giddiness: Secondary | ICD-10-CM

## 2017-04-27 DIAGNOSIS — R2681 Unsteadiness on feet: Secondary | ICD-10-CM

## 2017-04-27 MED ORDER — METRONIDAZOLE 500 MG PO TABS: 500.0000 mg | ORAL_TABLET | Freq: Two times a day (BID) | ORAL | 0 refills | Status: DC

## 2017-04-27 NOTE — Patient Instructions (Signed)
WALK WITH EYES CLOSED: HOLD TABLE FOR SUPPORT    Walk with upright posture WITH EYES CLOSED, ONE HAND HOLDING TABLE _6__ reps per set, _2__ sets per day.  Feet Partial Heel-Toe, Head Motion - Eyes Closed   With eyes closed and right foot partially in front of the other, stand as still as possible for 12 seconds.  Repeat with left foot forwards. Repeat 2 times per session. Do 2 sessions per day.  Feet Together (Compliant Surface) Head Motion - Eyes Closed    Stand on compliant surface: blanket folded up with feet together. Stand as still as possible. Repeat 2 times per session. Do 2 sessions per day.

## 2017-04-27 NOTE — Therapy (Signed)
[facility] [address] [place], [place], [address] Phone: [phone]   Fax:  [phone]  Physical Therapy Treatment  Patient Details  Name: Alyssa Hall MRN: [id number] Date of Birth: [birth year] Referring Provider: Aldine Contes, MD  Encounter Date: 04/27/2017      PT End of Session - 04/27/17 0927    Visit Number 5   Number of Visits 17   Date for PT Re-Evaluation 06/05/17   Authorization Type [employer], [employer] 100% covered   Authorization Time Period 03/19/17 through 09/19/17   PT Start Time 0848   PT Stop Time 0927   PT Time Calculation (min) 39 min   Activity Tolerance Patient tolerated treatment well   Behavior During Therapy Anxious;WFL for tasks assessed/performed      Past Medical History:  Diagnosis Date  . Anemia   . Anxiety   . Atrial tachycardia ([place])   . Depression   . Dizziness   . Fibroids   . Hypertension   . Hypothyroid   . Meniere's disease   . Thyrotoxicosis     History reviewed. No pertinent surgical history.  There were no vitals filed for this visit.      Subjective Assessment - 04/27/17 0850    Subjective Pt reports having a good weekend, was able to go to a movie yesterday and not feel dizzy afterwards!   Pertinent History hypothyroidism, HTN, depression, anxiety, hearing loss, Meniere's disease-although documentation from [facility] does not have list definitive dx   Patient Stated Goals To be able to get her balance back; to be able to dance at her wedding on Saturday   Currently in Pain? No/denies                          Vestibular Treatment/Exercise - 04/27/17 0917      Vestibular Treatment/Exercise   Vestibular Treatment Provided Gaze   Gaze Exercises X1 Viewing Horizontal;X1 Viewing Vertical     X1 Viewing Horizontal   Foot Position seated   Reps 4   Comments started with 10 seconds progressed to 15 seconds     X1 Viewing  Vertical   Foot Position seated   Reps 4   Comments started with 10 seconds progressed to 15 seconds            Balance Exercises - 04/27/17 0903      Balance Exercises: Standing   Standing Eyes Closed Narrow base of support (BOS);Wide (BOA);Foam/compliant surface;Solid surface;2 reps;3 reps;10 secs  feet apart, feet together, partial tandem, solid and pillow   Other Standing Exercises Performed walking forwards with eyes closed with one UE support on counter top and min A x 6 reps           PT Education - 04/27/17 0924    Education provided Yes   Education Details deep breathing/grounding, standing HEP in corner and holding table top, increased x 1 viewing to 15 seconds in sitting   Person(s) Educated Patient   Methods Explanation   Comprehension Verbalized understanding          PT Short Term Goals - 04/13/17 0943      PT SHORT TERM GOAL #1   Title Pt will tolerate and perform vestibular habituation, adaptation and balance HEP with supervision    Time 4   Period Weeks   Status New   Target Date 05/06/17     PT SHORT TERM GOAL #2   Title  Pt will tolerate further vestibular assessment: FGA, MSQ, DVA with LTG to be set   Baseline needs to perform DVA when able to tolerate   Time 4   Period Weeks   Status Partially Met   Target Date 05/06/17     PT SHORT TERM GOAL #3   Title Pt will decreased falls risk during community ambulation as indicated by gait velocity >3.0 ft/sec   Baseline 2.69 ft/sec   Time 4   Period Weeks   Status New   Target Date 05/06/17     PT SHORT TERM GOAL #4   Title Pt will report dizziness <5/10 while performing daily tasks at home and at work   Baseline 10/10 with vomiting   Time 4   Period Weeks   Status New   Target Date 05/06/17           PT Long Term Goals - 04/13/17 0944      PT LONG TERM GOAL #1   Title Pt will tolerate and be independent with vestibular and balance HEP   Time 8   Period Weeks   Status New    Target Date 06/05/17     PT LONG TERM GOAL #2   Title Pt will decrease falls risk during community gait as indicated by FGA score of > or = 23/30   Baseline 10/30   Time 8   Period Weeks   Status New   Target Date 06/05/17     PT LONG TERM GOAL #3   Title Pt will have decreased motion sensitivity and will report mild to no dizziness with varying movements on MSQ    Baseline moderate to severe with vomiting   Time 8   Period Weeks   Status New   Target Date 06/05/17     PT LONG TERM GOAL #4   Title Pt will improve gait velocity to >3.5 ft/sec for improved independence with community ambulation   Baseline 2.69 ft/sec   Time 8   Period Weeks   Status New     PT LONG TERM GOAL #5   Title Pt will improve DHI by 18 points to indicate decrease severity of symptoms   Baseline 66 severe   Time 8   Period Weeks   Status New     PT LONG TERM GOAL #6   Title DVA goal if indicated               Plan - 04/27/17 1324    Clinical Impression Statement Pt demonstrating improved tolerance to head movement and balance activities-able to progress standing balance exercises and add more challenging static and dynamic exercises to HEP to stimulate vestibular system.  Pt able to tolerate 15 seconds of x 1 viewing in sitting with vomiting.  Pt is making steady progress-will continue to progress towards LTG as pt tolerates.     Rehab Potential Good   Clinical Impairments Affecting Rehab Potential unable to complete previous vestibular rehab due to insurance/financial isues   PT Frequency 2x / week   PT Duration 8 weeks   PT Treatment/Interventions ADLs/Self Care Home Management;Canalith Repostioning;Gait training;Stair training;Functional mobility training;Therapeutic activities;Therapeutic exercise;Balance training;Neuromuscular re-education;Patient/family education;Vestibular;Visual/perceptual remediation/compensation   PT Next Visit Plan check HEP and progress standing balance and x1  viewing exercise; review compensatory saccades during transitional movements, gentle habituation exercises   PT Home Exercise Plan habituation, gaze adaptation   Consulted and Agree with Plan of Care Patient      Patient will benefit from  skilled therapeutic intervention in order to improve the following deficits and impairments:  Decreased balance, Difficulty walking, Dizziness  Visit Diagnosis: Dizziness and giddiness  Unsteadiness on feet  Difficulty in walking, not elsewhere classified     Problem List Patient Active Problem List   Diagnosis Date Noted  . Chronic low back pain 04/02/2017  . Muscle strain 03/28/2015  . Atrial ectopic tachycardia ([place]) 12/06/2014  . Healthcare maintenance 05/27/2013  . Meniere's disease 07/12/2012  . Dysmenorrhea 05/05/2012  . Iron deficiency anemia 02/04/2012  . Hypothyroidism (acquired) 01/14/2012  . Generalized anxiety disorder 01/14/2012    Raylene Everts, PT, DPT 04/27/17    1:27 PM    [facility] [address] [place], [place], [address] Phone: [phone]   Fax:  [phone]  Name: Preston Weill MRN: [id number] Date of Birth: [birth year]

## 2017-04-27 NOTE — Assessment & Plan Note (Signed)
Assessment: vaginal discharge Patient reports vaginal discharge with dysuria for the past 2 weeks. 1st day of last menstrual period was 2 about weeks ago.  A urine dipstick was ordered however patient could not provide a urine sample in the clinic despite several attempts.  Pelvic exam showed white discharge in the vaginal vault and very little blood outside the cervix.    Plan -checking GC/chlamydia, bacterial vaginosis, candida, trichomonas -Urine dipstick    Addendum:  Patient was positive for bacterial vaginitis.  Sent metronidazole

## 2017-04-29 NOTE — Progress Notes (Signed)
Internal Medicine Clinic Attending  Case discussed with Dr. Hoffman at the time of the visit.  We reviewed the resident's history and exam and pertinent patient test results.  I agree with the assessment, diagnosis, and plan of care documented in the resident's note.  

## 2017-05-01 ENCOUNTER — Ambulatory Visit: Payer: No Typology Code available for payment source | Admitting: Physical Therapy

## 2017-05-04 ENCOUNTER — Encounter: Payer: Self-pay | Admitting: Physical Therapy

## 2017-05-04 ENCOUNTER — Ambulatory Visit: Payer: No Typology Code available for payment source | Admitting: Physical Therapy

## 2017-05-04 DIAGNOSIS — R262 Difficulty in walking, not elsewhere classified: Secondary | ICD-10-CM

## 2017-05-04 DIAGNOSIS — R42 Dizziness and giddiness: Secondary | ICD-10-CM

## 2017-05-04 DIAGNOSIS — R2681 Unsteadiness on feet: Secondary | ICD-10-CM

## 2017-05-04 NOTE — Therapy (Signed)
[facility] [address] [place], [place], [address] Phone: [phone]   Fax:  [phone]  Physical Therapy Treatment  Patient Details  Name: Alyssa Hall MRN: [id number] Date of Birth: 02-20-1986 Referring Provider: Aldine Contes, MD  Encounter Date: 05/04/2017      PT End of Session - 05/04/17 0938    Visit Number 6   Number of Visits 17   Date for PT Re-Evaluation 06/05/17   Authorization Type [employer], [employer] 100% covered   Authorization Time Period 03/19/17 through 09/19/17   PT Start Time 0935   PT Stop Time 1015   PT Time Calculation (min) 40 min   Equipment Utilized During Treatment Gait belt   Activity Tolerance Patient tolerated treatment well   Behavior During Therapy Anxious;WFL for tasks assessed/performed      Past Medical History:  Diagnosis Date  . Anemia   . Anxiety   . Atrial tachycardia ([place])   . Depression   . Dizziness   . Fibroids   . Hypertension   . Hypothyroid   . Meniere's disease   . Thyrotoxicosis     History reviewed. No pertinent surgical history.  There were no vitals filed for this visit.      Subjective Assessment - 05/04/17 0936    Subjective Reports being able to rest over the weekend. "I am mangaging it" when asked how her dizizness is doing.    Pertinent History hypothyroidism, HTN, depression, anxiety, hearing loss, Meniere's disease-although documentation from [facility] does not have list definitive dx   Patient Stated Goals To be able to get her balance back; to be able to dance at her wedding on Saturday   Currently in Pain? Yes   Pain Score 5    Pain Location Heel   Pain Orientation Right   Pain Descriptors / Indicators Burning;Aching;Numbness   Pain Onset More than a month ago   Pain Frequency Constant   Aggravating Factors  unknown   Pain Relieving Factors unknown             [facility] Adult PT Treatment/Exercise - 05/04/17 0973       Ambulation/Gait   Ambulation/Gait Yes   Ambulation/Gait Assistance 6: Modified independent (Device/Increase time)   Ambulation/Gait Assistance Details cues for targeting a stable object with head/eyes, then turning body to face that direction with needing to make left or right turns during gait. 6/10 dizziness after gait distance with seated rest break with deep breathing and cues for gounding body to decrease dizziness.    Ambulation Distance (Feet) 250 Feet   Assistive device None   Gait Pattern Step-through pattern;Decreased step length - right;Decreased step length - left;Decreased stride length   Ambulation Surface Level;Indoor   Gait velocity 9.84 sec's= 3.33 ft/sec         Vestibular Treatment/Exercise - 05/04/17 0949      Vestibular Treatment/Exercise   Vestibular Treatment Provided Gaze   Gaze Exercises X1 Viewing Horizontal;X1 Viewing Vertical     X1 Viewing Horizontal   Foot Position seated   Reps 3   Comments 15 sec's progressing to 20 sec's on 3rd rep     X1 Viewing Vertical   Foot Position seated   Reps 3   Comments 15 sec's progressing to 20 sec's on 3rd rep            Balance Exercises - 05/04/17 1008      Balance Exercises: Standing   Standing Eyes Closed Narrow base  of support (BOS);Wide (BOA);Head turns;Solid surface;Other reps (comment);30 secs;Limitations     Balance Exercises: Standing   Standing Eyes Closed Limitations narrow base of support: EC no head movements, progressing to EC head movements left<>right and up<>down, 3 sets of 5 reps with slow head movements performed. light fingertip touch to chair back with headmovements needed. min guard assist from PTA.                                       PT Short Term Goals - 05/04/17 0939      PT SHORT TERM GOAL #1   Title Pt will tolerate and perform vestibular habituation, adaptation and balance HEP with supervision    Baseline 05/04/17: met as of HEP issued to date   Time --   Period --    Status Achieved     PT SHORT TERM GOAL #2   Title Pt will tolerate further vestibular assessment: FGA, MSQ, DVA with LTG to be set   Baseline needs to perform DVA when able to tolerate   Status Partially Met     PT SHORT TERM GOAL #3   Title Pt will decreased falls risk during community ambulation as indicated by gait velocity >3.0 ft/sec   Baseline 05/04/17: 3.33 ft/sec today   Time --   Period --   Status Achieved     PT SHORT TERM GOAL #4   Title Pt will report dizziness <5/10 while performing daily tasks at home and at work   Baseline 05/04/17: still there, howvever tries to rest when its approaching a 6/10. No more vomiting since with activity since starting therapy.    Status Partially Met           PT Long Term Goals - 04/13/17 0944      PT LONG TERM GOAL #1   Title Pt will tolerate and be independent with vestibular and balance HEP   Time 8   Period Weeks   Status New   Target Date 06/05/17     PT LONG TERM GOAL #2   Title Pt will decrease falls risk during community gait as indicated by FGA score of > or = 23/30   Baseline 10/30   Time 8   Period Weeks   Status New   Target Date 06/05/17     PT LONG TERM GOAL #3   Title Pt will have decreased motion sensitivity and will report mild to no dizziness with varying movements on MSQ    Baseline moderate to severe with vomiting   Time 8   Period Weeks   Status New   Target Date 06/05/17     PT LONG TERM GOAL #4   Title Pt will improve gait velocity to >3.5 ft/sec for improved independence with community ambulation   Baseline 2.69 ft/sec   Time 8   Period Weeks   Status New     PT LONG TERM GOAL #5   Title Pt will improve DHI by 18 points to indicate decrease severity of symptoms   Baseline 66 severe   Time 8   Period Weeks   Status New     PT LONG TERM GOAL #6   Title DVA goal if indicated               Plan - 05/04/17 6378    Clinical Impression Statement Today's skilled session began by  addressing progress  toward STGs with 2/4 met and the other 2 partially met. Remainder of session continued to address habituation and standing balance with vision removed. Pt is making slow, steady progress toward goals and should benefit from continued PT to progress toward unmet goals.                     Rehab Potential Good   Clinical Impairments Affecting Rehab Potential unable to complete previous vestibular rehab due to insurance/financial isues   PT Frequency 2x / week   PT Duration 8 weeks   PT Treatment/Interventions ADLs/Self Care Home Management;Canalith Repostioning;Gait training;Stair training;Functional mobility training;Therapeutic activities;Therapeutic exercise;Balance training;Neuromuscular re-education;Patient/family education;Vestibular;Visual/perceptual remediation/compensation   PT Next Visit Plan check HEP and progress standing balance and x1 viewing exercise; review compensatory saccades during transitional movements, gentle habituation exercises   PT Home Exercise Plan habituation, gaze adaptation   Consulted and Agree with Plan of Care Patient      Patient will benefit from skilled therapeutic intervention in order to improve the following deficits and impairments:  Decreased balance, Difficulty walking, Dizziness  Visit Diagnosis: Dizziness and giddiness  Unsteadiness on feet  Difficulty in walking, not elsewhere classified     Problem List Patient Active Problem List   Diagnosis Date Noted  . Vaginal discharge 04/27/2017  . Chronic low back pain 04/02/2017  . Muscle strain 03/28/2015  . Atrial ectopic tachycardia ([place]) 12/06/2014  . Healthcare maintenance 05/27/2013  . Meniere's disease 07/12/2012  . Dysmenorrhea 05/05/2012  . Iron deficiency anemia 02/04/2012  . Hypothyroidism (acquired) 01/14/2012  . Generalized anxiety disorder 01/14/2012    [name], PTA, [facility] [address] [place], [place]  [id number] [phone] 05/04/17, 1:12 PM   Name: Alyssa Hall MRN: [id number] Date of Birth: September 13, 1985

## 2017-05-07 ENCOUNTER — Ambulatory Visit: Payer: No Typology Code available for payment source | Admitting: Physical Therapy

## 2017-05-07 ENCOUNTER — Encounter: Payer: Self-pay | Admitting: Internal Medicine

## 2017-05-07 NOTE — Progress Notes (Deleted)
   CC: follow up on hypothyroidism   HPI:  Alyssa Hall is a 31 y.o. female   Past Medical History:  Diagnosis Date  . Anemia   . Anxiety   . Atrial tachycardia ([place])   . Depression   . Dizziness   . Fibroids   . Hypertension   . Hypothyroid   . Meniere's disease   . Thyrotoxicosis     Review of Systems:  ***  Physical Exam:  There were no vitals filed for this visit. ***  Assessment & Plan:   See encounters tab for problem based medical decision making.  Assessment: Hypothyroidism Patient's TSH was mildly elevated at 5.5 on 02/25/2017. On 03/30/2017 her Synthroid was increased from 25 mcg to 37.44mcg.  Patient reports compliance with Synthroid at 37.5 mcg.   Patient returns today for recheck of her TSH. Symptoms ***  Plan -Check TSH  Assessment: Microcytic anemia Lab work on 03/21/2017 revealed an MCV of 67 and a hemoglobin of 9.2. Ferritin has been low in the past, 14 on 06/2015 and most recently 27 on 05/07/2016.  Patient reports ***  Plan -iron  Patient {GC/GE:[id number]::"discussed with","seen with"} Dr. {NAMES:[id number]::"Butcher","Granfortuna","E. Akif Weldy","Klima","Mullen","Narendra","Vincent"}

## 2017-05-14 ENCOUNTER — Encounter: Payer: Self-pay | Admitting: Internal Medicine

## 2017-05-14 ENCOUNTER — Ambulatory Visit (INDEPENDENT_AMBULATORY_CARE_PROVIDER_SITE_OTHER): Payer: No Typology Code available for payment source | Admitting: Internal Medicine

## 2017-05-14 VITALS — BP 121/51 | HR 91 | Temp 98.6°F | Ht 64.0 in | Wt 223.8 lb

## 2017-05-14 DIAGNOSIS — D500 Iron deficiency anemia secondary to blood loss (chronic): Secondary | ICD-10-CM

## 2017-05-14 DIAGNOSIS — M722 Plantar fascial fibromatosis: Secondary | ICD-10-CM

## 2017-05-14 DIAGNOSIS — G43109 Migraine with aura, not intractable, without status migrainosus: Secondary | ICD-10-CM

## 2017-05-14 DIAGNOSIS — E039 Hypothyroidism, unspecified: Secondary | ICD-10-CM

## 2017-05-14 DIAGNOSIS — E890 Postprocedural hypothyroidism: Secondary | ICD-10-CM

## 2017-05-14 MED ORDER — FERROUS SULFATE 325 (65 FE) MG PO TABS: 325.0000 mg | ORAL_TABLET | Freq: Every day | ORAL | 3 refills | Status: DC

## 2017-05-14 NOTE — Progress Notes (Signed)
   CC: follow up on hypothyroidism   HPI:  [name] is a 31 y.o. female with history noted below that presents to the [facility] for follow up on her hypothyroidism.  Please see problem based charting for the status of patient's chronic medical conditions.  Past Medical History:  Diagnosis Date  . Anemia   . Anxiety   . Atrial tachycardia ([place])   . Depression   . Dizziness   . Fibroids   . Hypertension   . Hypothyroid   . Meniere's disease   . Thyrotoxicosis     Review of Systems:  Review of Systems  Respiratory: Negative for shortness of breath.   Cardiovascular: Negative for chest pain and palpitations.  Neurological: Positive for headaches.     Physical Exam:  Vitals:   05/14/17 1607  BP: (!) 121/51  Pulse: 91  Temp: 98.6 F (37 C)  TempSrc: Oral  SpO2: 100%  Weight: 223 lb 12.8 oz (101.5 kg)  Height: 5\' 4"  (1.626 m)   Physical Exam  Constitutional: She is well-developed, well-nourished, and in no distress.  Cardiovascular: Normal rate, regular rhythm and normal heart sounds.  Exam reveals no gallop and no friction rub.   No murmur heard. Pulmonary/Chest: Effort normal and breath sounds normal. No respiratory distress. She has no wheezes. She has no rales.  Musculoskeletal: She exhibits no edema.  Tenderness to the right heel   Skin: Skin is warm and dry. No rash noted. No erythema.    Assessment & Plan:   See encounters tab for problem based medical decision making.    Patient discussed with Dr. Daryll Drown

## 2017-05-14 NOTE — Patient Instructions (Addendum)
It was a pleasure seeing you today.  Please follow up with me in 6 months.                       Plantar Fasciitis Plantar fasciitis is a painful foot condition that affects the heel. It occurs when the band of tissue that connects the toes to the heel bone (plantar fascia) becomes irritated. This can happen after exercising too much or doing other repetitive activities (overuse injury). The pain from plantar fasciitis can range from mild irritation to severe pain that makes it difficult for you to walk or move. The pain is usually worse in the morning or after you have been sitting or lying down for a while. What are the causes? This condition may be caused by:  Standing for long periods of time.  Wearing shoes that do not fit.  Doing high-impact activities, including running, aerobics, and ballet.  Being overweight.  Having an abnormal way of walking (gait).  Having tight calf muscles.  Having high arches in your feet.  Starting a new athletic activity.  What are the signs or symptoms? The main symptom of this condition is heel pain. Other symptoms include:  Pain that gets worse after activity or exercise.  Pain that is worse in the morning or after resting.  Pain that goes away after you walk for a few minutes.  How is this diagnosed? This condition may be diagnosed based on your signs and symptoms. Your health care provider will also do a physical exam to check for:  A tender area on the bottom of your foot. A high arch in your foot.  Plantar Fasciitis Rehab Ask your health care provider which exercises are safe for you. Do exercises exactly as told by your health care provider and adjust them as directed. It is normal to feel mild stretching, pulling, tightness, or discomfort as you do these exercises, but you should stop right away if you feel sudden pain or your pain gets worse. Do not begin these exercises until told by your health care provider. Stretching and range  of motion exercises These exercises warm up your muscles and joints and improve the movement and flexibility of your foot. These exercises also help to relieve pain. Exercise A: Plantar fascia stretch  1. Sit with your left / right leg crossed over your opposite knee. 2. Hold your heel with one hand with that thumb near your arch. With your other hand, hold your toes and gently pull them back toward the top of your foot. You should feel a stretch on the bottom of your toes or your foot or both. 3. Hold this stretch for__________ seconds. 4. Slowly release your toes and return to the starting position. Repeat __________ times. Complete this exercise __________ times a day. Exercise B: Gastroc, standing  1. Stand with your hands against a wall. 2. Extend your left / right leg behind you, and bend your front knee slightly. 3. Keeping your heels on the floor and keeping your back knee straight, shift your weight toward the wall without arching your back. You should feel a gentle stretch in your left / right calf. 4. Hold this position for __________ seconds. Repeat __________ times. Complete this exercise __________ times a day. Exercise C: Soleus, standing 1. Stand with your hands against a wall. 2. Extend your left / right leg behind you, and bend your front knee slightly. 3. Keeping your heels on the floor, bend your back  knee and slightly shift your weight over the back leg. You should feel a gentle stretch deep in your calf. 4. Hold this position for __________ seconds. Repeat __________ times. Complete this exercise __________ times a day. Exercise D: Gastrocsoleus, standing 1. Stand with the ball of your left / right foot on a step. The ball of your foot is on the walking surface, right under your toes. 2. Keep your other foot firmly on the same step. 3. Hold onto the wall or a railing for balance. 4. Slowly lift your other foot, allowing your body weight to press your heel down over the  edge of the step. You should feel a stretch in your left / right calf. 5. Hold this position for __________ seconds. 6. Return both feet to the step. 7. Repeat this exercise with a slight bend in your left / right knee. Repeat __________ times with your left / right knee straight and __________ times with your left / right knee bent. Complete this exercise __________ times a day. Balance exercise This exercise builds your balance and strength control of your arch to help take pressure off your plantar fascia. Exercise E: Single leg stand 1. Without shoes, stand near a railing or in a doorway. You may hold onto the railing or door frame as needed. 2. Stand on your left / right foot. Keep your big toe down on the floor and try to keep your arch lifted. Do not let your foot roll inward. 3. Hold this position for __________ seconds. 4. If this exercise is too easy, you can try it with your eyes closed or while standing on a pillow. Repeat __________ times. Complete this exercise __________ times a day. This information is not intended to replace advice given to you by your health care provider. Make sure you discuss any questions you have with your health care provider. Document Released: 08/25/2005 Document Revised: 04/29/2016 Document Reviewed: 07/09/2015 Elsevier Interactive Patient Education  2018 [employer].    Pain when you move your foot.  Difficulty moving your foot.  You may also need to have imaging studies to confirm the diagnosis. These can include:  X-rays.  Ultrasound.  MRI.  How is this treated? Treatment for plantar fasciitis depends on the severity of the condition. Your treatment may include:  Rest, ice, and over-the-counter pain medicines to manage your pain.  Exercises to stretch your calves and your plantar fascia.  A splint that holds your foot in a stretched, upward position while you sleep (night splint).  Physical therapy to relieve symptoms and prevent  problems in the future.  Cortisone injections to relieve severe pain.  Extracorporeal shock wave therapy (ESWT) to stimulate damaged plantar fascia with electrical impulses. It is often used as a last resort before surgery.  Surgery, if other treatments have not worked after 12 months.  Follow these instructions at home:  Take medicines only as directed by your health care provider.  Avoid activities that cause pain.  Roll the bottom of your foot over a bag of ice or a bottle of cold water. Do this for 20 minutes, 3-4 times a day.  Perform simple stretches as directed by your health care provider.  Try wearing athletic shoes with air-sole or gel-sole cushions or soft shoe inserts.  Wear a night splint while sleeping, if directed by your health care provider.  Keep all follow-up appointments with your health care provider. How is this prevented?  Do not perform exercises or activities that cause  heel pain.  Consider finding low-impact activities if you continue to have problems.  Lose weight if you need to. The best way to prevent plantar fasciitis is to avoid the activities that aggravate your plantar fascia. Contact a health care provider if:  Your symptoms do not go away after treatment with home care measures.  Your pain gets worse.  Your pain affects your ability to move or do your daily activities. This information is not intended to replace advice given to you by your health care provider. Make sure you discuss any questions you have with your health care provider. Document Released: 05/20/2001 Document Revised: 01/28/2016 Document Reviewed: 07/05/2014 Elsevier Interactive Patient Education  [employer].

## 2017-05-15 ENCOUNTER — Ambulatory Visit: Payer: No Typology Code available for payment source | Attending: Internal Medicine | Admitting: Physical Therapy

## 2017-05-15 ENCOUNTER — Encounter: Payer: Self-pay | Admitting: Physical Therapy

## 2017-05-15 DIAGNOSIS — R42 Dizziness and giddiness: Secondary | ICD-10-CM

## 2017-05-15 DIAGNOSIS — R2681 Unsteadiness on feet: Secondary | ICD-10-CM | POA: Insufficient documentation

## 2017-05-15 DIAGNOSIS — R262 Difficulty in walking, not elsewhere classified: Secondary | ICD-10-CM

## 2017-05-15 LAB — TSH: TSH: 3.47 u[IU]/mL (ref 0.450–4.500)

## 2017-05-15 NOTE — Therapy (Signed)
[facility] [address] [place], [place], [address] Phone: [phone]   Fax:  [phone]  Physical Therapy Treatment  Patient Details  Name: Alyssa Hall MRN: [id number] Date of Birth: May 07, 1986 Referring Provider: Aldine Contes, MD  Encounter Date: 05/15/2017      PT End of Session - 05/15/17 1338    Visit Number 7   Number of Visits 17   Date for PT Re-Evaluation 06/05/17   Authorization Type [employer], [employer] 100% covered   Authorization Time Period 03/19/17 through 09/19/17   PT Start Time 0850   PT Stop Time 0930   PT Time Calculation (min) 40 min   Activity Tolerance Patient tolerated treatment well   Behavior During Therapy [facility] for tasks assessed/performed      Past Medical History:  Diagnosis Date  . Anemia   . Anxiety   . Atrial tachycardia ([place])   . Depression   . Dizziness   . Fibroids   . Hypertension   . Hypothyroid   . Meniere's disease   . Thyrotoxicosis     History reviewed. No pertinent surgical history.  There were no vitals filed for this visit.      Subjective Assessment - 05/15/17 0852    Subjective Pt reports doing pretty well.  Has a hard time with lightheadedness when on her menstrual cycle.   Pertinent History hypothyroidism, HTN, depression, anxiety, hearing loss, Meniere's disease-although documentation from [facility] does not have list definitive dx   Patient Stated Goals To be able to get her balance back; to be able to dance at her wedding on Saturday   Currently in Pain? Yes   Pain Score 5    Pain Location Heel   Pain Orientation Right                          Vestibular Treatment/Exercise - 05/15/17 0912      Vestibular Treatment/Exercise   Vestibular Treatment Provided Gaze   Gaze Exercises X1 Viewing Horizontal;X1 Viewing Vertical;Eye/Head Exercise Horizontal;Eye/Head Exercise Vertical     X1 Viewing Horizontal   Foot  Position seated, standing feet apart   Reps 4   Comments 15 sec progressing to 20 sec in sitting; 15 sec standing     X1 Viewing Vertical   Foot Position seated, standing feet apart   Reps 4   Comments 15 sec progressing to 20 sec in sitting; 15 sec standing     Eye/Head Exercise Horizontal   Foot Position seated   Reps 10   Comments progressing to larger ROM     Eye/Head Exercise Vertical   Foot Position seated   Reps 10   Comments progressing to larger ROM        Gaze Stabilization - Tip Card  1.Target must remain in focus, not blurry, and appear stationary while head is in motion. 2.Perform exercises with small head movements (45 to either side of midline). 3.Increase speed of head motion so long as target is in focus. 4.If you wear eyeglasses, be sure you can see target through lens (therapist will give specific instructions for bifocal / progressive lenses). 5.These exercises may provoke dizziness or nausea. Work through these symptoms. If too dizzy, slow head movement slightly. Rest between each exercise. 6.Exercises demand concentration; avoid distractions. 7.For safety, perform standing exercises close to a counter, wall, corner, or next to someone.  Copyright  VHI. All rights reserved.   Gaze Stabilization -  Standing Feet Apart   Feet shoulder width apart, keeping eyes on target on wall 3 feet away, tilt head down slightly and move head side to side for 15 seconds. Repeat while moving head up and down for 15 seconds.  *Work up to tolerating 60 seconds, as able. Do 2-3 sessions per day.  Compensatory Strategies: Corrective Saccades    1. Holding two stationary targets placed _12___ inches apart, move eyes to target, keep head still. 2. Then move head in direction of target while eyes remain on target. 3/4. Repeat in opposite direction. Perform sitting. Repeat sequence __10__ times side to side and then up<>down per session.   WALK WITH EYES CLOSED: HOLD  TABLE FOR SUPPORT    Walk with upright posture WITH EYES CLOSED, ONE HAND HOLDING TABLE-FORWARDS AND BACKWARDS _3__ reps per set, _2__ sets per day.   Up / Down Head Motion    WALK BESIDE YOUR TABLE OR COUNTER-ONE HAND ON THE COUNTER. Walking on solid surface, move head and eyes toward ceiling for _3___ steps. Then, move head and eyes toward floor for __3__ steps. Repeat __3__ times per session.  REPEAT LOOKING TO THE LEFT FOR 3 STEPS, LOOKING TO THE RIGHT FOR 3 STEPS.  REPEAT 3 TIMES. Copyright  VHI. All rights reserved.         PT Education - 05/15/17 1338    Education provided Yes   Education Details progressed HEP and added walking with head turns; education on multiple causes of vertigo and difference between vertigo and lightheadedness associated with anemia (pt asking if only women get vertigo because of menstrual cycles).     Person(s) Educated Patient   Methods Explanation;Demonstration;Handout   Comprehension Verbalized understanding;Returned demonstration          PT Short Term Goals - 05/04/17 0939      PT SHORT TERM GOAL #1   Title Pt will tolerate and perform vestibular habituation, adaptation and balance HEP with supervision    Baseline 05/04/17: met as of HEP issued to date   Time --   Period --   Status Achieved     PT SHORT TERM GOAL #2   Title Pt will tolerate further vestibular assessment: FGA, MSQ, DVA with LTG to be set   Baseline needs to perform DVA when able to tolerate   Status Partially Met     PT SHORT TERM GOAL #3   Title Pt will decreased falls risk during community ambulation as indicated by gait velocity >3.0 ft/sec   Baseline 05/04/17: 3.33 ft/sec today   Time --   Period --   Status Achieved     PT SHORT TERM GOAL #4   Title Pt will report dizziness <5/10 while performing daily tasks at home and at work   Baseline 05/04/17: still there, howvever tries to rest when its approaching a 6/10. No more vomiting since with activity since  starting therapy.    Status Partially Met           PT Long Term Goals - 04/13/17 0944      PT LONG TERM GOAL #1   Title Pt will tolerate and be independent with vestibular and balance HEP   Time 8   Period Weeks   Status New   Target Date 06/05/17     PT LONG TERM GOAL #2   Title Pt will decrease falls risk during community gait as indicated by FGA score of > or = 23/30   Baseline 10/30   Time  8   Period Weeks   Status New   Target Date 06/05/17     PT LONG TERM GOAL #3   Title Pt will have decreased motion sensitivity and will report mild to no dizziness with varying movements on MSQ    Baseline moderate to severe with vomiting   Time 8   Period Weeks   Status New   Target Date 06/05/17     PT LONG TERM GOAL #4   Title Pt will improve gait velocity to >3.5 ft/sec for improved independence with community ambulation   Baseline 2.69 ft/sec   Time 8   Period Weeks   Status New     PT LONG TERM GOAL #5   Title Pt will improve DHI by 18 points to indicate decrease severity of symptoms   Baseline 66 severe   Time 8   Period Weeks   Status New     PT LONG TERM GOAL #6   Title DVA goal if indicated               Plan - 05/15/17 1339    Clinical Impression Statement Treatment session today focused on review of HEP; due to improved tolerance able to progress x 1 viewing to standing and add walking with eyes closed backwards and walking with head turns in various directions.  Due to continued symptoms with compensatory saccades, will continue to perform in sitting.  Pt tolerated well with only one episode of needing to sit with eyes closed and perform breathing to settle symptoms.  Will continue to progress.   Rehab Potential Good   Clinical Impairments Affecting Rehab Potential unable to complete previous vestibular rehab due to insurance/financial isues   PT Frequency 2x / week   PT Duration 8 weeks   PT Treatment/Interventions ADLs/Self Care Home  Management;Canalith Repostioning;Gait training;Stair training;Functional mobility training;Therapeutic activities;Therapeutic exercise;Balance training;Neuromuscular re-education;Patient/family education;Vestibular;Visual/perceptual remediation/compensation   PT Next Visit Plan continue to progress balance, vestibular habituation and adaptation exercises   PT Home Exercise Plan habituation, gaze adaptation   Consulted and Agree with Plan of Care Patient      Patient will benefit from skilled therapeutic intervention in order to improve the following deficits and impairments:  Decreased balance, Difficulty walking, Dizziness  Visit Diagnosis: Dizziness and giddiness  Unsteadiness on feet  Difficulty in walking, not elsewhere classified     Problem List Patient Active Problem List   Diagnosis Date Noted  . Vaginal discharge 04/27/2017  . Chronic low back pain 04/02/2017  . Muscle strain 03/28/2015  . Atrial ectopic tachycardia ([place]) 12/06/2014  . Healthcare maintenance 05/27/2013  . Meniere's disease 07/12/2012  . Dysmenorrhea 05/05/2012  . Iron deficiency anemia 02/04/2012  . Hypothyroidism (acquired) 01/14/2012  . Generalized anxiety disorder 01/14/2012    Raylene Everts, PT, DPT 05/15/17    1:45 PM   [facility] [address] [place], [place], [address] Phone: [phone]   Fax:  [phone]  Name: Alyssa Hall MRN: [id number] Date of Birth: 1986-03-23

## 2017-05-15 NOTE — Patient Instructions (Addendum)
Gaze Stabilization - Tip Card  1.Target must remain in focus, not blurry, and appear stationary while head is in motion. 2.Perform exercises with small head movements (45 to either side of midline). 3.Increase speed of head motion so long as target is in focus. 4.If you wear eyeglasses, be sure you can see target through lens (therapist will give specific instructions for bifocal / progressive lenses). 5.These exercises may provoke dizziness or nausea. Work through these symptoms. If too dizzy, slow head movement slightly. Rest between each exercise. 6.Exercises demand concentration; avoid distractions. 7.For safety, perform standing exercises close to a counter, wall, corner, or next to someone.  Copyright  VHI. All rights reserved.   Gaze Stabilization - Standing Feet Apart   Feet shoulder width apart, keeping eyes on target on wall 3 feet away, tilt head down slightly and move head side to side for 15 seconds. Repeat while moving head up and down for 15 seconds.  *Work up to tolerating 60 seconds, as able. Do 2-3 sessions per day.  Compensatory Strategies: Corrective Saccades    1. Holding two stationary targets placed _12___ inches apart, move eyes to target, keep head still. 2. Then move head in direction of target while eyes remain on target. 3/4. Repeat in opposite direction. Perform sitting. Repeat sequence __10__ times side to side and then up<>down per session.   WALK WITH EYES CLOSED: HOLD TABLE FOR SUPPORT    Walk with upright posture WITH EYES CLOSED, ONE HAND HOLDING TABLE-FORWARDS AND BACKWARDS _3__ reps per set, _2__ sets per day.    Feet Partial Heel-Toe, Head Motion - Eyes Closed   With eyes closed and right foot partially in front of the other, stand as still as possible for 12 seconds.  Repeat with left foot forwards. Repeat 2 times per session. Do 2 sessions per day.  Feet Together (Compliant Surface) Head Motion - Eyes Closed    Stand on  compliant surface: blanket folded up with feet together. Stand as still as possible. Repeat 2 times per session. Do 2 sessions per day.  Up / Down Head Motion    WALK BESIDE YOUR TABLE OR COUNTER-ONE HAND ON THE COUNTER. Walking on solid surface, move head and eyes toward ceiling for _3___ steps. Then, move head and eyes toward floor for __3__ steps. Repeat __3__ times per session.  REPEAT LOOKING TO THE LEFT FOR 3 STEPS, LOOKING TO THE RIGHT FOR 3 STEPS.  REPEAT 3 TIMES. Copyright  VHI. All rights reserved.

## 2017-05-17 DIAGNOSIS — G43109 Migraine with aura, not intractable, without status migrainosus: Secondary | ICD-10-CM | POA: Insufficient documentation

## 2017-05-17 DIAGNOSIS — M722 Plantar fascial fibromatosis: Secondary | ICD-10-CM | POA: Insufficient documentation

## 2017-05-17 NOTE — Assessment & Plan Note (Signed)
Assessment:  Migraine headache Patient reports a 4 year history of migraine headaches that occur 1-2 times every 3 months.  Patient reports an aura that includes blurry vision followed by pain in the frontal region.  She has tried tylenol without relief.  Her headaches last about 30 minutes.  Patient would like referral to neurologist.    Plan -recommended trying Excedrin  -referral to neurology

## 2017-05-17 NOTE — Assessment & Plan Note (Signed)
Assessment:  Plantar fasciitis of right foot Patient reports a 1-2 month history of worsening foot pain located to the bottom of the foot that is worse in the morning.  She denies any radiating pain, weakness, trauma to the area.  She is able to bear weight on her right foot without difficulty.  Patient describes classic symptoms for plantar fasciitis.  I discussed using proper foot wear, exercises and NSAIDs to help with symptoms.  Plan -print out of foot stretches -NSAIDs

## 2017-05-17 NOTE — Assessment & Plan Note (Addendum)
Assessment:  Hypothyroidism TSH was 5.3 on 02/2017 during a cardiology clinic visit.  At the time patient was taking synthroid 27mcg. She followed up with the internal medicine clinic on 04/02/17 and Synthroid was increased to 37.72mcg during that clinic visit.  She states she has been compliant with the 37.20mcg dose daily.  Will check TSH today.  Plan -check TSH - continue synthroid 37.68mcg  Addendum:  TSH was 3.47

## 2017-05-17 NOTE — Assessment & Plan Note (Signed)
Assessment:  Microcytic anemia Patient has a history of iron deficiency anemia.  She states she has not been taking iron replacment supplements.  She asked for a refill today.  Plan -refill iron 325mg , daily at bedtime

## 2017-05-18 ENCOUNTER — Encounter: Payer: No Typology Code available for payment source | Admitting: Physical Therapy

## 2017-05-18 NOTE — Addendum Note (Signed)
Addended by: Kalman Shan R on: 05/18/2017 02:01 PM   Modules accepted: Orders

## 2017-05-18 NOTE — Progress Notes (Signed)
Internal Medicine Clinic Attending  Case discussed with Dr. Hoffman at the time of the visit.  We reviewed the resident's history and exam and pertinent patient test results.  I agree with the assessment, diagnosis, and plan of care documented in the resident's note.  

## 2017-05-20 ENCOUNTER — Encounter: Payer: No Typology Code available for payment source | Admitting: Family Medicine

## 2017-05-22 ENCOUNTER — Encounter: Payer: No Typology Code available for payment source | Admitting: Physical Therapy

## 2017-05-25 ENCOUNTER — Ambulatory Visit: Payer: No Typology Code available for payment source | Admitting: Physical Therapy

## 2017-05-25 ENCOUNTER — Telehealth: Payer: Self-pay

## 2017-05-25 NOTE — Telephone Encounter (Signed)
Requesting to speak with a nurse about med that is giving her headache. Also requesting lab results.

## 2017-05-26 NOTE — Telephone Encounter (Signed)
Pt states w/ the flagyl she is having h/a, nausea- no vomiting, general malaise, informed these are commonly associated with this medicine, encouraged to not take alcohol or alcohol containing foods, she is agreeable.would also like for you to call her with her lab results

## 2017-05-26 NOTE — Telephone Encounter (Signed)
Pt is calling back to speak with a nurse. 

## 2017-05-26 NOTE — Telephone Encounter (Signed)
No answer, lm for rtc 

## 2017-05-28 ENCOUNTER — Ambulatory Visit: Payer: No Typology Code available for payment source | Admitting: Physical Therapy

## 2017-05-28 DIAGNOSIS — R262 Difficulty in walking, not elsewhere classified: Secondary | ICD-10-CM

## 2017-05-28 DIAGNOSIS — R42 Dizziness and giddiness: Secondary | ICD-10-CM

## 2017-05-28 DIAGNOSIS — R2681 Unsteadiness on feet: Secondary | ICD-10-CM

## 2017-05-28 NOTE — Therapy (Signed)
[facility] [address] [place], [place], [address] Phone: [phone]   Fax:  [phone]  Physical Therapy Treatment  Patient Details  Name: Alyssa Hall MRN: [id number] Date of Birth: 10-03-1985 Referring Provider: Aldine Contes, MD  Encounter Date: 05/28/2017      PT End of Session - 05/28/17 1202    Visit Number 8   Number of Visits 17   Date for PT Re-Evaluation 06/05/17   Authorization Type [employer], [employer] 100% covered   Authorization Time Period 03/19/17 through 09/19/17   PT Start Time 1108   PT Stop Time 1148   PT Time Calculation (min) 40 min   Activity Tolerance Patient tolerated treatment well   Behavior During Therapy [facility] for tasks assessed/performed      Past Medical History:  Diagnosis Date  . Anemia   . Anxiety   . Atrial tachycardia ([place])   . Depression   . Dizziness   . Fibroids   . Hypertension   . Hypothyroid   . Meniere's disease   . Thyrotoxicosis     No past surgical history on file.  There were no vitals filed for this visit.      Subjective Assessment - 05/28/17 1113    Subjective Pt reports her dizziness is worse and she has been having stomach issues as well.  Pt just recently started new antibiotic; reviewed side effects of medication and noted side effects of dizziness, nausea and vomiting.   Patient is accompained by: Family member   Pertinent History hypothyroidism, HTN, depression, anxiety, hearing loss, Meniere's disease-although documentation from [facility] does not have list definitive dx   Patient Stated Goals To be able to get her balance back; to be able to dance at her wedding on Saturday   Currently in Pain? No/denies                          Vestibular Treatment/Exercise - 05/28/17 1114      Vestibular Treatment/Exercise   Vestibular Treatment Provided Gaze   Gaze Exercises X1 Viewing Horizontal;X1 Viewing Vertical     X1 Viewing Horizontal   Foot Position standing feet apart   Reps 2   Comments pt only tolerated 15 seconds today; pt with increased dizziness, nausea today due to new antibiotic medication.  Pt vomited after 2nd set     X1 Viewing Vertical   Foot Position standing feet apart   Reps 1   Comments pt only able to tolerate 15 seconds            Balance Exercises - 05/28/17 1139      Balance Exercises: Standing   Rockerboard Anterior/posterior;Lateral;Head turns;EO  while performing vertical, horz, diag compensatory saccades   Other Standing Exercises Performed forwards, lateral and anterior stepping with stepping over low obstacle transitioning between compliant gravel and compliant mulch to L and R x 2 sets with min HHA             PT Short Term Goals - 05/04/17 0939      PT SHORT TERM GOAL #1   Title Pt will tolerate and perform vestibular habituation, adaptation and balance HEP with supervision    Baseline 05/04/17: met as of HEP issued to date   Time --   Period --   Status Achieved     PT SHORT TERM GOAL #2   Title Pt will tolerate further vestibular assessment: FGA, MSQ, DVA with LTG  to be set   Baseline needs to perform DVA when able to tolerate   Status Partially Met     PT SHORT TERM GOAL #3   Title Pt will decreased falls risk during community ambulation as indicated by gait velocity >3.0 ft/sec   Baseline 05/04/17: 3.33 ft/sec today   Time --   Period --   Status Achieved     PT SHORT TERM GOAL #4   Title Pt will report dizziness <5/10 while performing daily tasks at home and at work   Baseline 05/04/17: still there, howvever tries to rest when its approaching a 6/10. No more vomiting since with activity since starting therapy.    Status Partially Met           PT Long Term Goals - 04/13/17 0944      PT LONG TERM GOAL #1   Title Pt will tolerate and be independent with vestibular and balance HEP   Time 8   Period Weeks   Status New   Target Date  06/05/17     PT LONG TERM GOAL #2   Title Pt will decrease falls risk during community gait as indicated by FGA score of > or = 23/30   Baseline 10/30   Time 8   Period Weeks   Status New   Target Date 06/05/17     PT LONG TERM GOAL #3   Title Pt will have decreased motion sensitivity and will report mild to no dizziness with varying movements on MSQ    Baseline moderate to severe with vomiting   Time 8   Period Weeks   Status New   Target Date 06/05/17     PT LONG TERM GOAL #4   Title Pt will improve gait velocity to >3.5 ft/sec for improved independence with community ambulation   Baseline 2.69 ft/sec   Time 8   Period Weeks   Status New     PT LONG TERM GOAL #5   Title Pt will improve DHI by 18 points to indicate decrease severity of symptoms   Baseline 66 severe   Time 8   Period Weeks   Status New     PT LONG TERM GOAL #6   Title DVA goal if indicated               Plan - 05/28/17 1202    Clinical Impression Statement Unable to progress x 1 viewing today due to pt unable to tolerate >15 seconds in standing due to side effects of new medication causing increased dizziness and nausea.  Transitioned focus to use of compensatory saccades in various directions standing on rocker board for increased balance and balance reaction challenges; required min A overall.  Also performed dynamic gait and obstacle negotiation on compliant surfaces outside.  Will continue to address at session tomorrow as pt tolerates.   Rehab Potential Good   Clinical Impairments Affecting Rehab Potential unable to complete previous vestibular rehab due to insurance/financial issues   PT Frequency 2x / week   PT Duration 8 weeks   PT Treatment/Interventions ADLs/Self Care Home Management;Canalith Repostioning;Gait training;Stair training;Functional mobility training;Therapeutic activities;Therapeutic exercise;Balance training;Neuromuscular re-education;Patient/family  education;Vestibular;Visual/perceptual remediation/compensation   PT Next Visit Plan perform FGA challenges on more compliant surfaces, stepping over obstacles, compliant surfaces outside   PT Home Exercise Plan habituation, gaze adaptation   Consulted and Agree with Plan of Care Patient      Patient will benefit from skilled therapeutic intervention in order to improve the  following deficits and impairments:  Decreased balance, Difficulty walking, Dizziness  Visit Diagnosis: Dizziness and giddiness  Unsteadiness on feet  Difficulty in walking, not elsewhere classified     Problem List Patient Active Problem List   Diagnosis Date Noted  . Migraine with aura and without status migrainosus, not intractable 05/17/2017  . Plantar fasciitis, right 05/17/2017  . Chronic low back pain 04/02/2017  . Muscle strain 03/28/2015  . Atrial ectopic tachycardia ([place]) 12/06/2014  . Healthcare maintenance 05/27/2013  . Meniere's disease 07/12/2012  . Dysmenorrhea 05/05/2012  . Iron deficiency anemia 02/04/2012  . Hypothyroidism (acquired) 01/14/2012  . Generalized anxiety disorder 01/14/2012   Raylene Everts, PT, DPT 05/28/17    12:08 PM    [facility] [address] [place], [place], [address] Phone: [phone]   Fax:  [phone]  Name: Alyssa Hall MRN: [id number] Date of Birth: 12/15/1985

## 2017-05-28 NOTE — Telephone Encounter (Signed)
Please let the patient know she had a TSH obtained and was 3.4 - within normal limits

## 2017-05-29 ENCOUNTER — Ambulatory Visit: Payer: No Typology Code available for payment source | Admitting: Physical Therapy

## 2017-06-01 ENCOUNTER — Ambulatory Visit: Payer: No Typology Code available for payment source | Admitting: Physical Therapy

## 2017-06-04 NOTE — Telephone Encounter (Signed)
done

## 2017-06-05 ENCOUNTER — Encounter: Payer: Self-pay | Admitting: Physical Therapy

## 2017-06-05 ENCOUNTER — Ambulatory Visit: Payer: No Typology Code available for payment source | Admitting: Physical Therapy

## 2017-06-05 DIAGNOSIS — R42 Dizziness and giddiness: Secondary | ICD-10-CM

## 2017-06-05 DIAGNOSIS — R2681 Unsteadiness on feet: Secondary | ICD-10-CM

## 2017-06-05 DIAGNOSIS — R262 Difficulty in walking, not elsewhere classified: Secondary | ICD-10-CM

## 2017-06-05 NOTE — Therapy (Signed)
[facility] [address] [place], [place], [address] Phone: [phone]   Fax:  [phone]  Physical Therapy Treatment  Patient Details  Name: Alyssa Hall MRN: [id number] Date of Birth: 01/06/1986 Referring Provider: Aldine Contes, MD  Encounter Date: 06/05/2017      PT End of Session - 06/05/17 1012    Visit Number 9   Number of Visits 17   Date for PT Re-Evaluation 63/14/97  recertify today for 4 more weeks   Authorization Type [employer], [employer] 100% covered   Authorization Time Period 03/19/17 through 09/19/17   PT Start Time 0941  pt arrived late   PT Stop Time 1015   PT Time Calculation (min) 34 min   Activity Tolerance Patient tolerated treatment well   Behavior During Therapy [facility] for tasks assessed/performed      Past Medical History:  Diagnosis Date  . Anemia   . Anxiety   . Atrial tachycardia ([place])   . Depression   . Dizziness   . Fibroids   . Hypertension   . Hypothyroid   . Meniere's disease   . Thyrotoxicosis     History reviewed. No pertinent surgical history.  There were no vitals filed for this visit.      Subjective Assessment - 06/05/17 0943    Subjective Reports she has been working a lot.  Dizziness is doing okay, feels that it is related to her menstrual cycle and the antibiotics.  Is almost finished with antibiotics.   Pertinent History hypothyroidism, HTN, depression, anxiety, hearing loss, Meniere's disease-although documentation from [facility] does not have list definitive dx   Patient Stated Goals To be able to get her balance back; to be able to dance at her wedding on Saturday   Currently in Pain? No/denies            [facility] PT Assessment - 06/05/17 0950      Assessment   Medical Diagnosis Vertigo   Referring Provider Aldine Contes, MD   Prior Therapy PT for dizziness; saw Arminda Resides.     Precautions   Precautions Other (comment)   Precaution  Comments HTN-check vitals     Balance Screen   Has the patient fallen in the past 6 months No   Has the patient had a decrease in activity level because of a fear of falling?  No   Is the patient reluctant to leave their home because of a fear of falling?  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  Does general housekeeping, meals and some lifting     Observation/Other Assessments   Focus on Therapeutic Outcomes (FOTO)  60 (40% limited)   Dizziness Handicap Inventory (DHI)  50% 16 point decrease     Standardized Balance Assessment   10 Meter Walk 9.72 sec or 3.37 ft/sec     Functional Gait  Assessment   Gait assessed  Yes   Gait Level Surface Walks 20 ft in less than 7 sec but greater than 5.5 sec, uses assistive device, slower speed, mild gait deviations, or deviates 6-10 in outside of the 12 in walkway width.   Change in Gait Speed Able to smoothly change walking speed without loss of balance or gait deviation. Deviate no more than 6 in outside of the 12 in walkway width.   Gait with Horizontal Head Turns  Performs head turns with moderate changes in gait velocity, slows down, deviates 10-15 in outside 12 in walkway width but recovers, can continue to walk.   Gait with Vertical Head Turns Performs task with moderate change in gait velocity, slows down, deviates 10-15 in outside 12 in walkway width but recovers, can continue to walk.   Gait and Pivot Turn Pivot turns safely in greater than 3 sec and stops with no loss of balance, or pivot turns safely within 3 sec and stops with mild imbalance, requires small steps to catch balance.   Step Over Obstacle Is able to step over one shoe box (4.5 in total height) but must slow down and adjust steps to clear box safely. May require verbal cueing.   Gait with Narrow Base of Support Ambulates 7-9 steps.   Gait with Eyes Closed Walks 20  ft, slow speed, abnormal gait pattern, evidence for imbalance, deviates 10-15 in outside 12 in walkway width. Requires more than 9 sec to ambulate 20 ft.   Ambulating Backwards Walks 20 ft, uses assistive device, slower speed, mild gait deviations, deviates 6-10 in outside 12 in walkway width.   Steps Alternating feet, must use rail.   Total Score 17   FGA comment: 17/30            Vestibular Assessment - 06/05/17 0945      Positional Sensitivities   Nose to Right Knee No dizziness   Right Knee to Sitting Mild dizziness   Nose to Left Knee No dizziness   Left Knee to Sitting Mild dizziness   Head Turning x 5 Mild dizziness   Head Nodding x 5 No dizziness   Pivot Right in Standing Moderate dizziness   Pivot Left in Standing Mild dizziness                         PT Education - 06/05/17 1012    Education provided Yes   Education Details clinical findings and progress towards LTG and plan to continue x 4 more weeks   Person(s) Educated Patient   Methods Explanation   Comprehension Verbalized understanding          PT Short Term Goals - 05/04/17 0939      PT SHORT TERM GOAL #1   Title Pt will tolerate and perform vestibular habituation, adaptation and balance HEP with supervision    Baseline 05/04/17: met as of HEP issued to date   Time --   Period --   Status Achieved     PT SHORT TERM GOAL #2   Title Pt will tolerate further vestibular assessment: FGA, MSQ, DVA with LTG to be set   Baseline needs to perform DVA when able to tolerate   Status Partially Met     PT SHORT TERM GOAL #3   Title Pt will decreased falls risk during community ambulation as indicated by gait velocity >3.0 ft/sec   Baseline 05/04/17: 3.33 ft/sec today   Time --   Period --   Status Achieved     PT SHORT TERM GOAL #4   Title Pt will report dizziness <5/10 while performing daily tasks at home and at work   Baseline 05/04/17: still there, howvever tries to rest when its  approaching a 6/10. No more vomiting since with activity since starting therapy.    Status Partially Met           PT Long Term Goals - 06/05/17 1004        PT LONG TERM GOAL #1   Title Pt will tolerate and be independent with vestibular and balance HEP   Baseline Performing at home with minimal dizziness   Time 8   Period Weeks   Status Achieved     PT LONG TERM GOAL #2   Title Pt will decrease falls risk during community gait as indicated by FGA score of > or = 23/30   Baseline 10/30, 17/30 on 06/05/17   Time 8   Period Weeks   Status Partially Met     PT LONG TERM GOAL #3   Title Pt will have decreased motion sensitivity and will report mild to no dizziness with varying movements on MSQ    Baseline 0 to mild dizziness for most movements but continues to report moderate for pivoting to R   Time 8   Period Weeks   Status Partially Met     PT LONG TERM GOAL #4   Title Pt will improve gait velocity to >3.5 ft/sec for improved independence with community ambulation   Baseline 2.69 ft/sec; 3.37 ft/sec on 06/05/17   Time 8   Period Weeks   Status Partially Met     PT LONG TERM GOAL #5   Title Pt will improve DHI by 18 points to indicate decrease severity of symptoms   Baseline 66 severe, 50% (decreased by 16)   Time 8   Period Weeks   Status Partially Met     PT LONG TERM GOAL #6   Title DVA goal if indicated               Plan - 06/05/17 1013    Clinical Impression Statement Treatment session today focused on reassessment of progress and LTG.  Pt has met 1/5 LTG, partially met 4/5 LTG.  Pt is able to ambulate at faster, safer community gait speeds, demonstrates decreased motion sensitivity overall with no more episodes of nausea/vomiting but continues to be at increased falls risk with more dynamic gait challenges.  She also continues to report moderate dizziness with pivoting in standing and mild dizziness with head turns/nods and HEP.  Pt continues to be 40-50%  limited by dizziness as indicated by DHI.  Due to nature of patient's and due to dizziness limiting pt's ability to participate in community wellness pt would benefit from ongoing skilled PT services to address impairments to maximize functional mobility independence and decrease falls risk.   Rehab Potential Good   Clinical Impairments Affecting Rehab Potential unable to complete previous vestibular rehab due to insurance/financial issues   PT Frequency 1x / week  per recertification   PT Duration 4 weeks  per recertification   PT Treatment/Interventions ADLs/Self Care Home Management;Canalith Repostioning;Gait training;Stair training;Functional mobility training;Therapeutic activities;Therapeutic exercise;Balance training;Neuromuscular re-education;Patient/family education;Vestibular;Visual/perceptual remediation/compensation   PT Next Visit Plan attepmt DVA if pt can tolerate; perform FGA challenges on more compliant surfaces, stepping over obstacles, compliant surfaces outside   PT Home Exercise Plan habituation, gaze adaptation   Consulted and Agree with Plan of Care Patient      Patient will benefit from skilled therapeutic intervention in order to improve the following deficits and impairments:  Decreased balance, Difficulty walking, Dizziness  Visit Diagnosis: Dizziness and giddiness  Unsteadiness on feet  Difficulty in walking, not elsewhere classified     Problem List Patient Active Problem List   Diagnosis Date Noted  . Migraine with aura and without status migrainosus, not intractable 05/17/2017  . Plantar fasciitis, right 05/17/2017  . Chronic   low back pain 04/02/2017  . Muscle strain 03/28/2015  . Atrial ectopic tachycardia (H[facility]) 12/06/2014  . Healthcare maintenance 05/27/2013  . Meniere's disease 07/12/2012  . Dysmenorrhea 05/05/2012  . Iron deficiency anemia 02/04/2012  . Hypothyroidism (acquired) 01/14/2012  . Generalized anxiety disorder 01/14/2012    Audra  Hall, PT, DPT 06/05/17    10:33 AM    [facility] [address] [place], [place], [address] Phone: [phone]   Fax:  [phone]  Name: Otie Annoh MRN: [id number] Date of Birth: 04/09/1986   

## 2017-06-08 ENCOUNTER — Ambulatory Visit (INDEPENDENT_AMBULATORY_CARE_PROVIDER_SITE_OTHER): Payer: Self-pay | Admitting: Clinical

## 2017-06-08 ENCOUNTER — Ambulatory Visit (INDEPENDENT_AMBULATORY_CARE_PROVIDER_SITE_OTHER): Payer: No Typology Code available for payment source | Admitting: Obstetrics & Gynecology

## 2017-06-08 ENCOUNTER — Encounter: Payer: Self-pay | Admitting: Obstetrics & Gynecology

## 2017-06-08 VITALS — BP 122/73 | HR 85 | Ht 64.0 in | Wt 222.0 lb

## 2017-06-08 DIAGNOSIS — F4321 Adjustment disorder with depressed mood: Secondary | ICD-10-CM

## 2017-06-08 DIAGNOSIS — F411 Generalized anxiety disorder: Secondary | ICD-10-CM

## 2017-06-08 DIAGNOSIS — N938 Other specified abnormal uterine and vaginal bleeding: Secondary | ICD-10-CM

## 2017-06-08 DIAGNOSIS — D219 Benign neoplasm of connective and other soft tissue, unspecified: Secondary | ICD-10-CM

## 2017-06-08 NOTE — BH Specialist Note (Signed)
Integrated Behavioral Health Initial Visit  MRN: [id number] Name: Alyssa Hall  Number of [facility] Clinician visits:: 1/6 Session Start time: 9:45  Session End time: 10:15 Total time: 30 minutes  Type of Service: [place] Interpretor:No. Interpretor Name and Language: n/a   Warm Hand Off Completed.       SUBJECTIVE: Alyssa Hall is a 31 y.o. female accompanied by n/a Patient was referred by Dr Hulan Fray for anxiety. Patient reports the following symptoms/concerns: Pt states her primary concern is fatigue, anxiety, and life stress, including sudden loss of 36yo nephew; attributes fatigue to Meniere's disease. Pt wants to start going to the gym with her husband and is open to learning a new strategy to help cope with daily stress and anxiety. Duration of problem: Increase in past month, since loss of nephew; Severity of problem: moderate  OBJECTIVE: Mood: Anxious and Affect: Tearful Risk of harm to self or others: No plan to harm self or others  LIFE CONTEXT: Family and Social: Lives with husband School/Work: She and husband both work Self-Care: Pt and husband joined gym recently; making plans to attend together. Pt listens to calming sounds for sleep. Life Changes: Loss of 30yo nephew  GOALS ADDRESSED: Patient will: 1. Reduce symptoms of: anxiety, depression and stress 2. Increase knowledge and/or ability of: self-management skills  3. Demonstrate ability to: Increase healthy adjustment to current life circumstances and Begin healthy grieving over loss  INTERVENTIONS: Interventions utilized: Mindfulness or Psychologist, educational, Psychoeducation and/or Health Education and Link to [employer]  Standardized Assessments completed: GAD-7 and PHQ 9  ASSESSMENT: Patient currently experiencing Grief.   Patient may benefit from psychoeducation and brief therapeutic intervention regarding with increased symptoms of  anxiety, triggered by recent grief.  PLAN: 1. Follow up with behavioral health clinician on : As needed 2. Behavioral recommendations:  -Continue taking iron, as recommended by medical provider, as it may help decrease overwhelming fatigue -CALM relaxation breathing exercise daily, within first hour of waking  -Consider apps as additional self-coping strategy -Consider hospice group grief support for self and entire family, as needed -Read educational materials regarding coping with symptoms of anxiety with panic attacks and depression 3. Referral(s): [place] (In Clinic) and Group grief support 4. "From scale of 1-10, how likely are you to follow plan?": 9  Garlan Fair, LCSWA  Depression screen [facility] 2/9 06/08/2017 05/14/2017 04/23/2017 03/30/2017 02/13/2017  Decreased Interest 3 3 0 0 0  Down, Depressed, Hopeless 2 3 0 0 0  PHQ - 2 Score 5 6 0 0 0  Altered sleeping 2 0 - - -  Tired, decreased energy 3 3 - - -  Change in appetite 2 1 - - -  Feeling bad or failure about yourself  0 0 - - -  Trouble concentrating 2 0 - - -  Moving slowly or fidgety/restless 0 0 - - -  Suicidal thoughts 0 0 - - -  PHQ-9 Score 14 10 - - -  Difficult doing work/chores - Not difficult at all - - -  Some recent data might be hidden   GAD 7 : Generalized Anxiety Score 06/08/2017  Nervous, Anxious, on Edge 2  Control/stop worrying 2  Worry too much - different things 2  Trouble relaxing 1  Restless 1  Easily annoyed or irritable 2  Afraid - awful might happen 2  Total GAD 7 Score 12

## 2017-06-08 NOTE — Progress Notes (Signed)
   Subjective:    [name], female    DOB: 24-Apr-1986, 31 y.o.   MRN: [id number]  HPI 31 yo M AA P0 here today to discuss her desire for pregnancy. Her periods are monthly, lasting 7 days, heavy, sometimes painful. She was seen here in 2016 for DUB and an u/s showed fibroids and polyps. She has not been seen here since. She was prescribed OCPS at that visit but did not take them. Her hemoglobin was 9.2 in July. She has Pica (ice and clay). She recently started taking iron daily Her husband does not have children.   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    Objective:   Physical Exam Breathing, conversing, and ambulating normally Well nourished, well hydrated black female, no apparent distress     Assessment & Plan:  Desire for pregnancy- rec MVI Rec OVP kits Heavy painful periods with h/o fibroids- schedule gyn u/s Rec increase iron to BID, Miralax prn Check hemoglobin A1C

## 2017-06-08 NOTE — Progress Notes (Signed)
Patient verbally consented to meet with [facility] Clinician about presenting concerns. Dr Hulan Fray aware

## 2017-06-09 LAB — HEMOGLOBIN A1C
Est. average glucose Bld gHb Est-mCnc: 114 mg/dL
HEMOGLOBIN A1C: 5.6 % (ref 4.8–5.6)

## 2017-06-11 ENCOUNTER — Ambulatory Visit (HOSPITAL_COMMUNITY)
Admission: RE | Admit: 2017-06-11 | Discharge: 2017-06-11 | Disposition: A | Discharge status: Home / Self Care | Payer: No Typology Code available for payment source | Source: Ambulatory Visit | Attending: Obstetrics & Gynecology | Admitting: Obstetrics & Gynecology

## 2017-06-11 DIAGNOSIS — D259 Leiomyoma of uterus, unspecified: Secondary | ICD-10-CM | POA: Insufficient documentation

## 2017-06-11 DIAGNOSIS — N939 Abnormal uterine and vaginal bleeding, unspecified: Secondary | ICD-10-CM | POA: Insufficient documentation

## 2017-06-11 DIAGNOSIS — D219 Benign neoplasm of connective and other soft tissue, unspecified: Secondary | ICD-10-CM

## 2017-06-16 ENCOUNTER — Ambulatory Visit (INDEPENDENT_AMBULATORY_CARE_PROVIDER_SITE_OTHER): Payer: No Typology Code available for payment source | Admitting: *Deleted

## 2017-06-16 DIAGNOSIS — Z23 Encounter for immunization: Secondary | ICD-10-CM

## 2017-06-16 DIAGNOSIS — Z111 Encounter for screening for respiratory tuberculosis: Secondary | ICD-10-CM

## 2017-06-16 MED ORDER — TUBERCULIN PPD 5 UNIT/0.1ML ID SOLN
5.0000 [IU] | Freq: Once | INTRADERMAL | Status: AC
  Administered 2017-06-16: 5 [IU] via INTRADERMAL

## 2017-06-18 ENCOUNTER — Ambulatory Visit: Payer: No Typology Code available for payment source | Attending: Internal Medicine | Admitting: Physical Therapy

## 2017-06-18 DIAGNOSIS — R42 Dizziness and giddiness: Secondary | ICD-10-CM | POA: Insufficient documentation

## 2017-06-18 DIAGNOSIS — R2681 Unsteadiness on feet: Secondary | ICD-10-CM | POA: Insufficient documentation

## 2017-06-19 LAB — TB SKIN TEST
Induration: 0 mm
TB SKIN TEST: NEGATIVE

## 2017-06-19 NOTE — Addendum Note (Signed)
Addended by: Marcelino Duster on: 06/19/2017 10:21 AM   Modules accepted: Orders

## 2017-06-19 NOTE — Addendum Note (Signed)
Addended by: Ebbie Latus on: 06/19/2017 10:36 AM   Modules accepted: Orders

## 2017-06-22 ENCOUNTER — Ambulatory Visit: Payer: No Typology Code available for payment source | Admitting: Physical Therapy

## 2017-06-29 ENCOUNTER — Ambulatory Visit: Payer: No Typology Code available for payment source | Admitting: Physical Therapy

## 2017-06-29 DIAGNOSIS — R2681 Unsteadiness on feet: Secondary | ICD-10-CM

## 2017-06-29 DIAGNOSIS — R42 Dizziness and giddiness: Secondary | ICD-10-CM

## 2017-06-29 NOTE — Therapy (Signed)
[facility] [address] [place], [place], [address] Phone: [phone]   Fax:  [phone]  Physical Therapy Treatment  Patient Details  Name: Alyssa Hall MRN: [id number] Date of Birth: Jul 25, 1986 Referring Provider: Aldine Contes, MD  Encounter Date: 06/29/2017      PT End of Session - 06/29/17 1947    Visit Number 10   Number of Visits 13   Date for PT Re-Evaluation 07/05/17   Authorization Type [employer], [employer] 100% covered   Authorization Time Period 03/19/17 through 09/19/17   PT Start Time 6962  pt arrived 10" late   PT Stop Time 0930   PT Time Calculation (min) 35 min      Past Medical History:  Diagnosis Date  . Anemia   . Anxiety   . Atrial tachycardia ([place])   . Depression   . Dizziness   . Fibroids   . Hypertension   . Hypothyroid   . Meniere's disease   . Thyrotoxicosis     No past surgical history on file.  There were no vitals filed for this visit.      Subjective Assessment - 06/29/17 1942    Subjective Pt reports she has been going to gym - has ridden bike and has been doing seated exercises (not treadmill) in order to minimize the dizziness during activity   Pertinent History hypothyroidism, HTN, depression, anxiety, hearing loss, Meniere's disease-although documentation from [facility] ENT does not have list definitive dx   Patient Stated Goals To be able to get her balance back; to be able to dance at her wedding on Saturday   Currently in Pain? No/denies                         [facility] Adult PT Treatment/Exercise - 06/29/17 0910      Ambulation/Gait   Ambulation/Gait Yes   Ambulation/Gait Assistance 5: Supervision   Ambulation/Gait Assistance Details pt tossed ball up and caught while amb.: (small ROM) to improve gaze stabilization; amb. with horizontal head turns 115'   Ambulation Distance (Feet) 115 Feet  1st lap tossing ball; 2nd lap with head turns    Assistive device None   Gait Pattern Step-through pattern;Decreased step length - right;Decreased step length - left;Decreased stride length   Ambulation Surface Level;Indoor             Balance Exercises - 06/29/17 1942      Balance Exercises: Standing   Standing Eyes Opened Narrow base of support (BOS);Wide (BOA);Head turns;Foam/compliant surface;Solid surface;5 reps   Standing Eyes Closed Wide (BOA);Head turns;Foam/compliant surface;Solid surface;5 reps   Rockerboard Anterior/posterior;Head turns;EO;30 seconds  performing gaze stabilization with targets on either side    Other Standing Exercises Pt performed marching and alternate stepping on incline and on decline on ramp (no compliant surface used on this surface) in order to not provoke excessive dizziness; pt performed marching with EO and EC and also added head turns horizontal and vertical             PT Short Term Goals - 06/05/17 1030      PT SHORT TERM GOAL #1   Title = LTG           PT Long Term Goals - 06/05/17 1035      PT LONG TERM GOAL #1   Title Pt will tolerate and be independent with vestibular and balance HEP with 0/10 dizziness   Baseline Performing at home  with minimal dizziness   Time 4   Period Weeks   Status Revised   Target Date 07/05/17     PT LONG TERM GOAL #2   Title Pt will decrease falls risk during community gait as indicated by FGA score of > or = 22/30   Baseline 10/30, 17/30 on 06/05/17   Time 4   Period Weeks   Status Revised   Target Date 07/05/17     PT LONG TERM GOAL #3   Title Pt will have decreased motion sensitivity and will report mild to no dizziness with head turns/nods and standing pivots on MSQ    Baseline 0 to mild dizziness for most movements but continues to report moderate for pivoting to R   Time 4   Period Weeks   Status Revised   Target Date 07/05/17     PT LONG TERM GOAL #4   Title Pt will improve gait velocity to >3.5 ft/sec for improved  independence with community ambulation   Baseline 2.69 ft/sec; 3.37 ft/sec on 06/05/17   Time 4   Period Weeks   Status Revised   Target Date 07/05/17     PT LONG TERM GOAL #5   Title Pt will improve DHI by 18 (to 48) points to indicate decrease severity of symptoms   Baseline 66 severe, 50% (decreased by 16)   Time 4   Period Weeks   Status Revised   Target Date 07/05/17     PT LONG TERM GOAL #6   Title DVA goal if indicated   Status On-going               Plan - 06/29/17 1948    Clinical Impression Statement Pt improving with increasing mobility and is decreasing motion sensitivity slowly with progress - continues to require freqent short seated rest breaks to allow dizziness to decrease, especially with activities requring incr. vestibular input   Rehab Potential Good   Clinical Impairments Affecting Rehab Potential unable to complete previous vestibular rehab due to insurance/financial issues   PT Frequency 1x / week   PT Duration 4 weeks   PT Treatment/Interventions ADLs/Self Care Home Management;Canalith Repostioning;Gait training;Stair training;Functional mobility training;Therapeutic activities;Therapeutic exercise;Balance training;Neuromuscular re-education;Patient/family education;Vestibular;Visual/perceptual remediation/compensation   PT Next Visit Plan check LTG's - D/C?   PT Home Exercise Plan habituation, gaze adaptation   Consulted and Agree with Plan of Care Patient      Patient will benefit from skilled therapeutic intervention in order to improve the following deficits and impairments:  Decreased balance, Difficulty walking, Dizziness  Visit Diagnosis: Unsteadiness on feet  Dizziness and giddiness     Problem List Patient Active Problem List   Diagnosis Date Noted  . Migraine with aura and without status migrainosus, not intractable 05/17/2017  . Plantar fasciitis, right 05/17/2017  . Chronic low back pain 04/02/2017  . Muscle strain  03/28/2015  . Atrial ectopic tachycardia ([place]) 12/06/2014  . Healthcare maintenance 05/27/2013  . Meniere's disease 07/12/2012  . Dysmenorrhea 05/05/2012  . Iron deficiency anemia 02/04/2012  . Hypothyroidism (acquired) 01/14/2012  . Generalized anxiety disorder 01/14/2012    Alda Lea, PT 06/29/2017, 7:55 PM  [facility] [address] [place], [place], [address] Phone: [phone]   Fax:  [phone]  Name: Alyssa Hall MRN: [id number] Date of Birth: [birth year]

## 2017-07-01 ENCOUNTER — Ambulatory Visit (INDEPENDENT_AMBULATORY_CARE_PROVIDER_SITE_OTHER): Payer: Self-pay | Admitting: Obstetrics & Gynecology

## 2017-07-01 ENCOUNTER — Encounter: Payer: Self-pay | Admitting: Obstetrics & Gynecology

## 2017-07-01 VITALS — Wt 222.2 lb

## 2017-07-01 DIAGNOSIS — N920 Excessive and frequent menstruation with regular cycle: Secondary | ICD-10-CM

## 2017-07-01 DIAGNOSIS — Z319 Encounter for procreative management, unspecified: Secondary | ICD-10-CM

## 2017-07-01 DIAGNOSIS — D219 Benign neoplasm of connective and other soft tissue, unspecified: Secondary | ICD-10-CM

## 2017-07-01 NOTE — Progress Notes (Signed)
   Subjective:    [name], female    DOB: September 04, 1986, 31 y.o.   MRN: [id number]  HPI  31 yo M AA G0 here for follow up after an gyn u/s done for heavy painful periods.  Review of Systems Pap smear 2016 normal She has been having unprotected IC since marriage about 1 1/2 years ago.    Objective:   Physical Exam Pleasant AA lady Breathing, conversing, and ambulating normally Abd- benign     Assessment & Plan:  She declines a flu vaccine today Desire for pregnancy- rec MVI Offered watchful waiting, possible myomectomy, visit with Dr. Kerin Perna

## 2017-07-02 ENCOUNTER — Ambulatory Visit: Payer: Self-pay | Admitting: Neurology

## 2017-07-02 ENCOUNTER — Telehealth: Payer: Self-pay | Admitting: *Deleted

## 2017-07-02 NOTE — Addendum Note (Signed)
Addended by: Hulan Fray on: 07/02/2017 05:40 PM   Modules accepted: Orders

## 2017-07-02 NOTE — Telephone Encounter (Signed)
Patient no show new pt appt on 07/02/2017 @ 07:30

## 2017-07-06 ENCOUNTER — Ambulatory Visit: Payer: No Typology Code available for payment source | Admitting: Physical Therapy

## 2017-07-10 ENCOUNTER — Ambulatory Visit: Payer: No Typology Code available for payment source | Attending: Internal Medicine

## 2017-07-10 DIAGNOSIS — R42 Dizziness and giddiness: Secondary | ICD-10-CM | POA: Insufficient documentation

## 2017-07-10 DIAGNOSIS — R2681 Unsteadiness on feet: Secondary | ICD-10-CM | POA: Insufficient documentation

## 2017-07-10 DIAGNOSIS — R262 Difficulty in walking, not elsewhere classified: Secondary | ICD-10-CM | POA: Insufficient documentation

## 2017-07-10 NOTE — Therapy (Signed)
[facility] [address] [place], [place], [address] Phone: [phone]   Fax:  [phone]  Physical Therapy Treatment  Patient Details  Name: Alyssa Hall MRN: [id number] Date of Birth: 1986-03-08 Referring Provider: Aldine Contes, MD  Encounter Date: 07/10/2017      PT End of Session - 07/10/17 1125    Visit Number 11   Number of Visits 13   Date for PT Re-Evaluation 07/05/17   Authorization Type [employer], [employer] 100% covered   Authorization Time Period 03/19/17 through 09/19/17   PT Start Time 1101   PT Stop Time 1130  FOTO [id number]-no charge   PT Time Calculation (min) 29 min   Equipment Utilized During Treatment --  min guard to S during FGA prn   Activity Tolerance Patient tolerated treatment well   Behavior During Therapy [facility] for tasks assessed/performed      Past Medical History:  Diagnosis Date  . Anemia   . Anxiety   . Atrial tachycardia ([place])   . Depression   . Dizziness   . Fibroids   . Hypertension   . Hypothyroid   . Meniere's disease   . Thyrotoxicosis     History reviewed. No pertinent surgical history.  There were no vitals filed for this visit.      Subjective Assessment - 07/10/17 1103    Subjective Pt reports dizziness is still there, but does not have dizziness right now. At worst pt reports dizziness is 4/10 and it was 6-7/10 prior to starting PT.    Pertinent History hypothyroidism, HTN, depression, anxiety, hearing loss, Meniere's disease-although documentation from [facility] ENT does not have list definitive dx   Patient Stated Goals To be able to get her balance back; to be able to dance at her wedding on Saturday   Currently in Pain? No/denies            [facility] PT Assessment - 07/10/17 1105      Functional Gait  Assessment   Gait assessed  Yes   Gait Level Surface Walks 20 ft in less than 5.5 sec, no assistive devices, good speed, no evidence for  imbalance, normal gait pattern, deviates no more than 6 in outside of the 12 in walkway width.   Change in Gait Speed Able to smoothly change walking speed without loss of balance or gait deviation. Deviate no more than 6 in outside of the 12 in walkway width.   Gait with Horizontal Head Turns Performs head turns smoothly with slight change in gait velocity (eg, minor disruption to smooth gait path), deviates 6-10 in outside 12 in walkway width, or uses an assistive device.  1/10 dizziness after head turns   Gait with Vertical Head Turns Performs task with slight change in gait velocity (eg, minor disruption to smooth gait path), deviates 6 - 10 in outside 12 in walkway width or uses assistive device   Gait and Pivot Turn Pivot turns safely in greater than 3 sec and stops with no loss of balance, or pivot turns safely within 3 sec and stops with mild imbalance, requires small steps to catch balance.  4/10 dizziness after turn   Step Over Obstacle Is able to step over 2 stacked shoe boxes taped together (9 in total height) without changing gait speed. No evidence of imbalance.   Gait with Narrow Base of Support Is able to ambulate for 10 steps heel to toe with no staggering.   Gait with Eyes Closed Walks  20 ft, slow speed, abnormal gait pattern, evidence for imbalance, deviates 10-15 in outside 12 in walkway width. Requires more than 9 sec to ambulate 20 ft.   Ambulating Backwards Walks 20 ft, uses assistive device, slower speed, mild gait deviations, deviates 6-10 in outside 12 in walkway width.   Steps Alternating feet, must use rail.   Total Score 23   FGA comment: 23/30 indicates pt is at moderate risk for falls.                      [facility] Adult PT Treatment/Exercise - 07/10/17 1105      Ambulation/Gait   Ambulation/Gait Yes   Ambulation/Gait Assistance 7: Independent   Ambulation/Gait Assistance Details Pt performed gait speed testing with no LOB and amb. back to gym with no LOB  or dizziness.   Ambulation Distance (Feet) 75 Feet  x2, 200'   Assistive device None   Gait Pattern Step-through pattern;Decreased stride length   Ambulation Surface Level;Indoor   Gait velocity 3.22f/sec.           Self Care:     PT Education - 07/10/17 1124    Education provided Yes   Education Details PT and pt reviewed HEP together and pt reported she understood exercises but hadn't been performing standing balance and saccades HEP often. Pt reported no dizziness during HEP, PT reiterated the importance of continuing HEP every day until sx's subside and then performing 2-3 times/week to maintain gains. PT also encouraged pt to perform 1/4 turns in a corner at home, to reduce dizziness during turns when at work. PT discussed goals progress and outcome measure results. Pt agreeable to d/c.    Person(s) Educated Patient   Methods Explanation   Comprehension Verbalized understanding          PT Short Term Goals - 06/05/17 1030      PT SHORT TERM GOAL #1   Title = LTG           PT Long Term Goals - 07/10/17 1148      PT LONG TERM GOAL #1   Title Pt will tolerate and be independent with vestibular and balance HEP with 0/10 dizziness   Time 4   Period Weeks   Status Achieved     PT LONG TERM GOAL #2   Title Pt will decrease falls risk during community gait as indicated by FGA score of > or = 22/30   Time 4   Period Weeks   Status Achieved     PT LONG TERM GOAL #3   Title Pt will have decreased motion sensitivity and will report mild to no dizziness with head turns/nods and standing pivots on MSQ    Time 4   Period Weeks   Status Partially Met     PT LONG TERM GOAL #4   Title Pt will improve gait velocity to >3.5 ft/sec for improved independence with community ambulation   Time 4   Period Weeks   Status Achieved     PT LONG TERM GOAL #5   Title Pt will improve DHI by 18 (to 48) points to indicate decrease severity of symptoms   Time 4   Period Weeks    Status Achieved     PT LONG TERM GOAL #6   Title DVA goal if indicated   Status Deferred               Plan - 07/10/17 1126    Clinical  Impression Statement Pt met goals 1, 2, 4, and 5. Pt partially met LTG 3. LTG 6 deferred as DVA goal not writtent. Please see pt's d/c summary for details.    Rehab Potential Good   Clinical Impairments Affecting Rehab Potential unable to complete previous vestibular rehab due to insurance/financial issues   PT Frequency 1x / week   PT Duration 4 weeks   PT Treatment/Interventions ADLs/Self Care Home Management;Canalith Repostioning;Gait training;Stair training;Functional mobility training;Therapeutic activities;Therapeutic exercise;Balance training;Neuromuscular re-education;Patient/family education;Vestibular;Visual/perceptual remediation/compensation   PT Next Visit Plan d/c   PT Home Exercise Plan habituation, gaze adaptation   Consulted and Agree with Plan of Care Patient      Patient will benefit from skilled therapeutic intervention in order to improve the following deficits and impairments:  Decreased balance, Difficulty walking, Dizziness  Visit Diagnosis: Dizziness and giddiness  Unsteadiness on feet  Difficulty in walking, not elsewhere classified     Problem List Patient Active Problem List   Diagnosis Date Noted  . Migraine with aura and without status migrainosus, not intractable 05/17/2017  . Plantar fasciitis, right 05/17/2017  . Chronic low back pain 04/02/2017  . Muscle strain 03/28/2015  . 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 Healthcare maintenance 05/27/2013  . Meniere's disease 07/12/2012  . Dysmenorrhea 05/05/2012  . Iron deficiency anemia 02/04/2012  . Hypothyroidism (acquired) 01/14/2012  . Generalized anxiety disorder 01/14/2012    Kirtis Challis L 07/10/2017, 11:49 AM  [facility] [address] [place], [place], [address] Phone:  [phone]   Fax:  [phone]  Name: Alyssa Hall MRN: [id number] Date of Birth: 1986-04-21  PHYSICAL THERAPY DISCHARGE SUMMARY  Visits from Start of Care: 11  Current functional level related to goals / functional outcomes:     PT Long Term Goals - 07/10/17 1148      PT LONG TERM GOAL #1   Title Pt will tolerate and be independent with vestibular and balance HEP with 0/10 dizziness   Time 4   Period Weeks   Status Achieved     PT LONG TERM GOAL #2   Title Pt will decrease falls risk during community gait as indicated by FGA score of > or = 22/30   Time 4   Period Weeks   Status Achieved     PT LONG TERM GOAL #3   Title Pt will have decreased motion sensitivity and will report mild to no dizziness with head turns/nods and standing pivots on MSQ    Time 4   Period Weeks   Status Partially Met     PT LONG TERM GOAL #4   Title Pt will improve gait velocity to >3.5 ft/sec for improved independence with community ambulation   Time 4   Period Weeks   Status Achieved     PT LONG TERM GOAL #5   Title Pt will improve DHI by 18 (to 48) points to indicate decrease severity of symptoms   Time 4   Period Weeks   Status Achieved     PT LONG TERM GOAL #6   Title DVA goal if indicated   Status Deferred        Remaining deficits: Mild dizziness during turns.   Education / Equipment: HEP  Plan: Patient agrees to discharge.  Patient goals were met. Patient is being discharged due to meeting the stated rehab goals.  ?????         Geoffry Paradise, PT,DPT 07/10/17 11:50 AM Phone: [phone] Fax: [phone]

## 2017-08-13 ENCOUNTER — Emergency Department (HOSPITAL_COMMUNITY)
Admission: EM | Admit: 2017-08-13 | Discharge: 2017-08-13 | Disposition: A | Discharge status: Home / Self Care | Payer: No Typology Code available for payment source | Attending: Emergency Medicine | Admitting: Emergency Medicine

## 2017-08-13 ENCOUNTER — Other Ambulatory Visit: Payer: Self-pay

## 2017-08-13 ENCOUNTER — Emergency Department (HOSPITAL_COMMUNITY): Payer: No Typology Code available for payment source

## 2017-08-13 DIAGNOSIS — R0602 Shortness of breath: Secondary | ICD-10-CM | POA: Insufficient documentation

## 2017-08-13 DIAGNOSIS — E039 Hypothyroidism, unspecified: Secondary | ICD-10-CM | POA: Insufficient documentation

## 2017-08-13 DIAGNOSIS — R000 Tachycardia, unspecified: Secondary | ICD-10-CM | POA: Insufficient documentation

## 2017-08-13 DIAGNOSIS — Z79899 Other long term (current) drug therapy: Secondary | ICD-10-CM | POA: Insufficient documentation

## 2017-08-13 DIAGNOSIS — R110 Nausea: Secondary | ICD-10-CM | POA: Insufficient documentation

## 2017-08-13 DIAGNOSIS — R002 Palpitations: Secondary | ICD-10-CM | POA: Insufficient documentation

## 2017-08-13 DIAGNOSIS — I10 Essential (primary) hypertension: Secondary | ICD-10-CM | POA: Insufficient documentation

## 2017-08-13 LAB — CBC WITH DIFFERENTIAL/PLATELET
Basophils Absolute: 0 10*3/uL (ref 0.0–0.1)
Basophils Relative: 0 %
Eosinophils Absolute: 0.1 10*3/uL (ref 0.0–0.7)
Eosinophils Relative: 1 %
HCT: 30.2 % — ABNORMAL LOW (ref 36.0–46.0)
Hemoglobin: 8.9 g/dL — ABNORMAL LOW (ref 12.0–15.0)
Lymphocytes Relative: 23 %
Lymphs Abs: 1.7 10*3/uL (ref 0.7–4.0)
MCH: 18.9 pg — ABNORMAL LOW (ref 26.0–34.0)
MCHC: 29.5 g/dL — ABNORMAL LOW (ref 30.0–36.0)
MCV: 64.1 fL — ABNORMAL LOW (ref 78.0–100.0)
Monocytes Absolute: 1 10*3/uL (ref 0.1–1.0)
Monocytes Relative: 13 %
Neutro Abs: 4.8 10*3/uL (ref 1.7–7.7)
Neutrophils Relative %: 63 %
Platelets: 349 10*3/uL (ref 150–400)
RBC: 4.71 MIL/uL (ref 3.87–5.11)
RDW: 19 % — ABNORMAL HIGH (ref 11.5–15.5)
WBC: 7.6 10*3/uL (ref 4.0–10.5)

## 2017-08-13 LAB — BASIC METABOLIC PANEL
Anion gap: 6 (ref 5–15)
BUN: 9 mg/dL (ref 6–20)
CO2: 21 mmol/L — ABNORMAL LOW (ref 22–32)
Calcium: 9.1 mg/dL (ref 8.9–10.3)
Chloride: 107 mmol/L (ref 101–111)
Creatinine, Ser: 0.79 mg/dL (ref 0.44–1.00)
GFR calc Af Amer: >60 mL/min (ref >60)
GFR calc non Af Amer: >60 mL/min (ref >60)
Glucose, Bld: 78 mg/dL (ref 65–99)
Potassium: 3.6 mmol/L (ref 3.5–5.1)
Sodium: 134 mmol/L — ABNORMAL LOW (ref 135–145)

## 2017-08-13 LAB — TSH: TSH: 3.286 u[IU]/mL (ref 0.350–4.500)

## 2017-08-13 LAB — I-STAT BETA HCG BLOOD, ED (MC, WL, AP ONLY): I-stat hCG, quantitative: <5 m[IU]/mL (ref <5)

## 2017-08-13 LAB — I-STAT TROPONIN, ED: Troponin i, poc: 0 ng/mL (ref 0.00–0.08)

## 2017-08-13 LAB — D-DIMER, QUANTITATIVE: D-Dimer, Quant: 0.36 ug/mL-FEU (ref 0.00–0.50)

## 2017-08-13 NOTE — ED Provider Notes (Signed)
Care assumed from previous provider PA Law. Please see their note for further details to include full history and physical. To summarize in short pt is a 31 year old female presents to the ED for evaluations of heart palpitations for 5-6 minutes.. Case discussed, plan agreed upon.  The patient's workup is in very reassuring in the ED.  Has had negative troponins.  EKG was unremarkable in the ED.  However EMS states that she had some nonspecific ST changes in the EKG EMS was reading atrial flutter.  They wanted to speak with cardiology concerning this.  I spoke with cardiology attending who looked at the EKG and felt like patient did not need admission or any further emergent intervention.  Feel that patient can be followed up in outpatient setting.  Patient feels much improved and has denies any further palpitations while in the ED.  Vital signs remained reassuring.  Dr. Ellender Hose who evaluated patient is agreeable with discharge at this time.   Pt is hemodynamically stable, in NAD, & able to ambulate in the ED. Evaluation does not show pathology that would require ongoing emergent intervention or inpatient treatment. I explained the diagnosis to the patient. Pain has been managed & has no complaints prior to dc. Pt is comfortable with above plan and is stable for discharge at this time. All questions were answered prior to disposition. Strict return precautions for f/u to the ED were discussed. Encouraged follow up with PCP.       Doristine Devoid, PA-C 08/13/17 Audria Nine    Duffy Bruce, MD 08/14/17 [phone]

## 2017-08-13 NOTE — ED Notes (Signed)
Pt departed in NAD, refused use of wheelchair.  

## 2017-08-13 NOTE — ED Provider Notes (Signed)
[facility] EMERGENCY DEPARTMENT Provider Note   CSN: [id number] Arrival date & time: 08/13/17  1754     History   Chief Complaint Chief Complaint  Patient presents with  . Tachycardia    HPI Alyssa Hall is a 31 y.o. female with history of hypothyroidism, atrial tachycardia, Mnire's disease who presents following a 5-6-minute episode of heart palpitations and tachycardia.  Patient woke up out of sleep today around 5 PM.  She had associated shortness of breath and nausea during the episode, however these have both resolved.  She denies any chest pain.  She does not recall this happening in the past.  She denies any abdominal pain, vomiting.  She also denies any recent long trips, surgeries, known cancer, history of blood clots, exogenous estrogen use, or any new leg pain or swelling.  HPI  Past Medical History:  Diagnosis Date  . Anemia   . Anxiety   . Atrial tachycardia ([place])   . Depression   . Dizziness   . Fibroids   . Hypertension   . Hypothyroid   . Meniere's disease   . Thyrotoxicosis     Patient Active Problem List   Diagnosis Date Noted  . Migraine with aura and without status migrainosus, not intractable 05/17/2017  . Plantar fasciitis, right 05/17/2017  . Chronic low back pain 04/02/2017  . Muscle strain 03/28/2015  . 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 Healthcare maintenance 05/27/2013  . Meniere's disease 07/12/2012  . Dysmenorrhea 05/05/2012  . Iron deficiency anemia 02/04/2012  . Hypothyroidism (acquired) 01/14/2012  . Generalized anxiety disorder 01/14/2012    No past surgical history on file.  OB History    Gravida Para Term Preterm AB Living   1       1     SAB TAB Ectopic Multiple Live Births     1             Home Medications    Prior to Admission medications   Medication Sig Start Date End Date Taking? Authorizing Provider  acetaminophen (TYLENOL) 325 MG tablet Take 650 mg by mouth every 6 (six) hours  as needed for mild pain.    Yes [provider]  atenolol (TENORMIN) 50 MG tablet Take 1 tablet (50 mg total) by mouth daily. 02/13/17  Yes Shela Leff, MD  ferrous sulfate 325 (65 FE) MG tablet Take 1 tablet (325 mg total) by mouth at bedtime. 05/14/17  Yes Valinda Party, DO  levothyroxine (SYNTHROID, LEVOTHROID) 25 MCG tablet Take 1.5 tablets (37.5 mcg total) by mouth daily before breakfast. 03/30/17  Yes Hoffman, Elza Rafter, DO    Family History Family History  Problem Relation Age of Onset  . Heart failure Maternal Aunt     Social History Social History   Tobacco Use  . Smoking status: Never Smoker  . Smokeless tobacco: Never Used  Substance Use Topics  . Alcohol use: No    Alcohol/week: 0.0 oz  . Drug use: No     Allergies   Patient has no known allergies.   Review of Systems Review of Systems  Constitutional: Negative for chills and fever.  HENT: Negative for facial swelling and sore throat.   Respiratory: Positive for shortness of breath.   Cardiovascular: Positive for palpitations. Negative for chest pain and leg swelling.  Gastrointestinal: Positive for nausea. Negative for abdominal pain and vomiting.  Genitourinary: Negative for dysuria.  Musculoskeletal: Negative for back pain.  Skin: Negative for rash and  wound.  Neurological: Negative for headaches.  Psychiatric/Behavioral: The patient is not nervous/anxious.      Physical Exam Updated Vital Signs BP 104/68   Pulse 85   Temp 98.2 F (36.8 C) (Oral)   Resp 14   Ht 5\' 4"  (1.626 m)   Wt 101.6 kg (224 lb)   LMP 07/30/2017 Comment: Pt states she might be pregnant  SpO2 100%   BMI 38.45 kg/m   Physical Exam  Constitutional: She appears well-developed and well-nourished. No distress.  HENT:  Head: Normocephalic and atraumatic.  Mouth/Throat: Oropharynx is clear and moist. No oropharyngeal exudate.  Eyes: Conjunctivae are normal. Pupils are equal, round, and reactive to  light. Right eye exhibits no discharge. Left eye exhibits no discharge. No scleral icterus.  Neck: Normal range of motion. Neck supple. No thyromegaly present.  Cardiovascular: Regular rhythm, normal heart sounds and intact distal pulses. Tachycardia present. Exam reveals no gallop and no friction rub.  No murmur heard. Pulmonary/Chest: Effort normal and breath sounds normal. No stridor. No respiratory distress. She has no wheezes. She has no rales.  Abdominal: Soft. Bowel sounds are normal. She exhibits no distension. There is no tenderness. There is no rebound and no guarding.  Musculoskeletal: She exhibits no edema.  Lymphadenopathy:    She has no cervical adenopathy.  Neurological: She is alert. Coordination normal.  Skin: Skin is warm and dry. No rash noted. She is not diaphoretic. No pallor.  Psychiatric: She has a normal mood and affect.  Nursing note and vitals reviewed.    ED Treatments / Results  Labs (all labs ordered are listed, but only abnormal results are displayed) Labs Reviewed  BASIC METABOLIC PANEL - Abnormal; Notable for the following components:      Result Value   Sodium 134 (*)    CO2 21 (*)    All other components within normal limits  CBC WITH DIFFERENTIAL/PLATELET - Abnormal; Notable for the following components:   Hemoglobin 8.9 (*)    HCT 30.2 (*)    MCV 64.1 (*)    MCH 18.9 (*)    MCHC 29.5 (*)    RDW 19.0 (*)    All other components within normal limits  TSH  D-DIMER, QUANTITATIVE (NOT AT [facility])  I-STAT TROPONIN, ED  I-STAT BETA HCG BLOOD, ED ([facility], [facility], [facility] ONLY)    EKG  EKG Interpretation  Date/Time:  Thursday August 13 2017 18:42:00 EST Ventricular Rate:  103 PR Interval:    QRS Duration: 89 QT Interval:  339 QTC Calculation: 444 R Axis:   33 Text Interpretation:  Sinus tachycardia Prolonged PR interval Since last EKG, P axis now appears normal but with first degree AV block Confirmed by Duffy Bruce [phone]) on 08/13/2017 6:57:23 PM         Radiology Dg Chest 2 View  Result Date: 08/13/2017 CLINICAL DATA:  Shortness of breath and tachycardia EXAM: CHEST  2 VIEW COMPARISON:  03/21/2017 FINDINGS: The heart size and mediastinal contours are within normal limits. Both lungs are clear. The visualized skeletal structures are unremarkable. IMPRESSION: No active cardiopulmonary disease. Electronically Signed   By: Inez Catalina M.D.   On: 08/13/2017 19:28    Procedures Procedures (including critical care time)  Medications Ordered in ED Medications - No data to display   Initial Impression / Assessment and Plan / ED Course  I have reviewed the triage vital signs and the nursing notes.  Pertinent labs & imaging results that were available during my care of  the patient were reviewed by me and considered in my medical decision making (see chart for details).     Patient presents following an episode of tachycardia and shortness of breath that lasted around 6-7 minutes. EMS EKG strip showed a sinus tachycardia rhythm (rate 121) with some ST depression that is concerning. EKG in the ED shows sinus tachycardia with rate 103 without ST depression. Labs are unremarkable except for stable, chronic anemia, hgb 8.9. D-dimer and troponin negative. TSH WNL. I consulted cardiology for recommendations and evaluation of EKG with ST depressions. At shift change, patient care transferred to Melina Schools, PA-C for continued evaluation, follow up of cardiology consult and determination of disposition. Anticipate discharge pending cardiology consult and no recommendations for further evaluation or admission. I discussed patient case with Dr. Ellender Hose who guided the patient's management and agrees with plan.     Final Clinical Impressions(s) / ED Diagnoses   Final diagnoses:  Palpitations  Tachycardia    ED Discharge Orders    None       Frederica Kuster, Hershal Coria 08/13/17 2156    Duffy Bruce, MD 08/14/17 [phone]

## 2017-08-13 NOTE — Discharge Instructions (Signed)
Your workup has been very reassuring.  Unknown cause of her symptoms.  Very important that you follow-up with your primary care doctor and cardiology.  May call them today for an appointment.  Return to the ED for chest pain becomes exertional, worsening palpitations, worsening shortness of breath or for any reason.

## 2017-10-02 ENCOUNTER — Other Ambulatory Visit: Payer: Self-pay

## 2017-10-02 ENCOUNTER — Encounter (HOSPITAL_COMMUNITY): Payer: Self-pay | Admitting: Emergency Medicine

## 2017-10-02 ENCOUNTER — Emergency Department (HOSPITAL_COMMUNITY): Payer: Self-pay

## 2017-10-02 ENCOUNTER — Emergency Department (HOSPITAL_COMMUNITY)
Admission: EM | Admit: 2017-10-02 | Discharge: 2017-10-02 | Disposition: A | Discharge status: Home / Self Care | Payer: Self-pay | Attending: Emergency Medicine | Admitting: Emergency Medicine

## 2017-10-02 DIAGNOSIS — Z79899 Other long term (current) drug therapy: Secondary | ICD-10-CM | POA: Insufficient documentation

## 2017-10-02 DIAGNOSIS — D509 Iron deficiency anemia, unspecified: Secondary | ICD-10-CM | POA: Insufficient documentation

## 2017-10-02 DIAGNOSIS — R002 Palpitations: Secondary | ICD-10-CM | POA: Insufficient documentation

## 2017-10-02 DIAGNOSIS — E039 Hypothyroidism, unspecified: Secondary | ICD-10-CM | POA: Insufficient documentation

## 2017-10-02 DIAGNOSIS — I10 Essential (primary) hypertension: Secondary | ICD-10-CM | POA: Insufficient documentation

## 2017-10-02 LAB — BASIC METABOLIC PANEL
ANION GAP: 11 (ref 5–15)
BUN: 13 mg/dL (ref 6–20)
CHLORIDE: 103 mmol/L (ref 101–111)
CO2: 22 mmol/L (ref 22–32)
Calcium: 9.5 mg/dL (ref 8.9–10.3)
Creatinine, Ser: 0.73 mg/dL (ref 0.44–1.00)
GFR calc non Af Amer: >60 mL/min (ref >60)
Glucose, Bld: 101 mg/dL — ABNORMAL HIGH (ref 65–99)
POTASSIUM: 3.8 mmol/L (ref 3.5–5.1)
Sodium: 136 mmol/L (ref 135–145)

## 2017-10-02 LAB — CBC
HCT: 30.3 % — ABNORMAL LOW (ref 36.0–46.0)
HEMOGLOBIN: 8.7 g/dL — AB (ref 12.0–15.0)
MCH: 18.3 pg — AB (ref 26.0–34.0)
MCHC: 28.7 g/dL — ABNORMAL LOW (ref 30.0–36.0)
MCV: 63.7 fL — AB (ref 78.0–100.0)
Platelets: 403 10*3/uL — ABNORMAL HIGH (ref 150–400)
RBC: 4.76 MIL/uL (ref 3.87–5.11)
RDW: 18 % — ABNORMAL HIGH (ref 11.5–15.5)
WBC: 7.3 10*3/uL (ref 4.0–10.5)

## 2017-10-02 LAB — I-STAT TROPONIN, ED: Troponin i, poc: 0 ng/mL (ref 0.00–0.08)

## 2017-10-02 LAB — I-STAT BETA HCG BLOOD, ED (MC, WL, AP ONLY): I-stat hCG, quantitative: <5 m[IU]/mL (ref <5)

## 2017-10-02 MED ORDER — FERROUS SULFATE 325 (65 FE) MG PO TABS: 325.0000 mg | ORAL_TABLET | Freq: Every day | ORAL | 3 refills | Status: DC

## 2017-10-02 NOTE — Discharge Instructions (Signed)
Take iron pills as prescribed.  Your hemoglobin is 8.7. Repeat with your doctor next week   See your doctor. See cardiology if you have persistent palpitations   Return to ER if you have worse chest pain, shortness of breath, passing out

## 2017-10-02 NOTE — ED Provider Notes (Signed)
[facility] EMERGENCY DEPARTMENT Provider Note   CSN: [id number] Arrival date & time: 10/02/17  4196     History   Chief Complaint Chief Complaint  Patient presents with  . Palpitations    HPI Alyssa Hall is a 32 y.o. female history of fibroids, hypertension, heavy menses here presenting with palpitations, chest pressure.  Patient states that she had heavy menses at baseline and had a last menstrual period about 2 weeks ago.  Patient states that for the last week or so she has intermittent palpitations that is worse with exertion and some associated shortness of breath.  Denies any pleuritic chest pain or recent travel or leg swelling or calf pain.  Denies any changes in her weight.  Patient was in the ED about a month ago and had a negative d-dimer and unremarkable labs at that time.  Patient was referred to cardiology but did not have insurance at that time so never saw them.  Patient also has seen GYN in the past for fibroids and has no active vaginal bleeding or discharge.   The history is provided by the patient.    Past Medical History:  Diagnosis Date  . Anemia   . Anxiety   . Atrial tachycardia ([place])   . Depression   . Dizziness   . Fibroids   . Hypertension   . Hypothyroid   . Meniere's disease   . Thyrotoxicosis     Patient Active Problem List   Diagnosis Date Noted  . Migraine with aura and without status migrainosus, not intractable 05/17/2017  . Plantar fasciitis, right 05/17/2017  . Chronic low back pain 04/02/2017  . Muscle strain 03/28/2015  . 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 Healthcare maintenance 05/27/2013  . Meniere's disease 07/12/2012  . Dysmenorrhea 05/05/2012  . Iron deficiency anemia 02/04/2012  . Hypothyroidism (acquired) 01/14/2012  . Generalized anxiety disorder 01/14/2012    History reviewed. No pertinent surgical history.  OB History    Gravida Para Term Preterm AB Living   1       1     SAB TAB  Ectopic Multiple Live Births     1             Home Medications    Prior to Admission medications   Medication Sig Start Date End Date Taking? Authorizing Provider  acetaminophen (TYLENOL) 325 MG tablet Take 650 mg by mouth every 6 (six) hours as needed for mild pain.     [provider]  atenolol (TENORMIN) 50 MG tablet Take 1 tablet (50 mg total) by mouth daily. 02/13/17   Shela Leff, MD  ferrous sulfate 325 (65 FE) MG tablet Take 1 tablet (325 mg total) by mouth at bedtime. 05/14/17   Valinda Party, DO  levothyroxine (SYNTHROID, LEVOTHROID) 25 MCG tablet Take 1.5 tablets (37.5 mcg total) by mouth daily before breakfast. 03/30/17   Valinda Party, DO    Family History Family History  Problem Relation Age of Onset  . Heart failure Maternal Aunt     Social History Social History   Tobacco Use  . Smoking status: Never Smoker  . Smokeless tobacco: Never Used  Substance Use Topics  . Alcohol use: No    Alcohol/week: 0.0 oz  . Drug use: No     Allergies   Patient has no known allergies.   Review of Systems Review of Systems  Cardiovascular: Positive for palpitations.  All other systems reviewed and are negative.  Physical Exam Updated Vital Signs BP (!) 135/100   Pulse (!) 103   Temp 99.1 F (37.3 C) (Oral)   Resp 16   LMP 09/18/2017 (Within Days)   SpO2 100%   Physical Exam  Constitutional: She is oriented to person, place, and time. She appears well-developed.  HENT:  Head: Normocephalic.  Right Ear: External ear normal.  Left Ear: External ear normal.  Mouth/Throat: Oropharynx is clear and moist.  Eyes: Pupils are equal, round, and reactive to light.  Conjunctiva slightly pale   Neck: Normal range of motion. Neck supple.  Cardiovascular: Regular rhythm and normal heart sounds.  Borderline tachycardic   Pulmonary/Chest: Effort normal and breath sounds normal. No stridor. No respiratory distress.  Abdominal: Soft.  Bowel sounds are normal. She exhibits no distension. There is no tenderness.  Musculoskeletal: Normal range of motion. She exhibits no edema or deformity.  Neurological: She is alert and oriented to person, place, and time.  Skin: Skin is warm.  Psychiatric: She has a normal mood and affect.  Nursing note and vitals reviewed.    ED Treatments / Results  Labs (all labs ordered are listed, but only abnormal results are displayed) Labs Reviewed  BASIC METABOLIC PANEL - Abnormal; Notable for the following components:      Result Value   Glucose, Bld 101 (*)    All other components within normal limits  CBC - Abnormal; Notable for the following components:   Hemoglobin 8.7 (*)    HCT 30.3 (*)    MCV 63.7 (*)    MCH 18.3 (*)    MCHC 28.7 (*)    RDW 18.0 (*)    Platelets 403 (*)    All other components within normal limits  I-STAT TROPONIN, ED  I-STAT BETA HCG BLOOD, ED ([facility], [facility], [facility] ONLY)    EKG  EKG Interpretation  Date/Time:  Friday October 02 2017 06:41:56 EST Ventricular Rate:  103 PR Interval:  188 QRS Duration: 78 QT Interval:  360 QTC Calculation: 471 R Axis:   38 Text Interpretation:  Sinus tachycardia Otherwise normal ECG No significant change since last tracing Confirmed by Wandra Arthurs [phone]) on 10/02/2017 8:46:52 AM       Radiology Dg Chest 2 View  Result Date: 10/02/2017 CLINICAL DATA:  Cardiac palpitations and chest pain EXAM: CHEST  2 VIEW COMPARISON:  August 13, 2017 FINDINGS: Lungs are clear. Heart size and pulmonary vascularity are normal. No adenopathy. No pneumothorax. No bone lesions. IMPRESSION: No edema or consolidation. Electronically Signed   By: Lowella Grip III M.D.   On: 10/02/2017 07:12    Procedures Procedures (including critical care time)  Medications Ordered in ED Medications - No data to display   Initial Impression / Assessment and Plan / ED Course  I have reviewed the triage vital signs and the nursing notes.  Pertinent  labs & imaging results that were available during my care of the patient were reviewed by me and considered in my medical decision making (see chart for details).     Alyssa Hall is a 32 y.o. female here with palpitations. Had heavy menses 2 weeks ago but no active bleeding. EKG showed sinus tachy rate 103. Hg 8.7, was 9 last month. She had negative D-dimer last month, no PE risk factors. CXR clear. I think likely symptomatic anemia. Will start on iron pills, can refer to cardiology. Has PCP follow up in a week and can get repeat CBC.   Final Clinical Impressions(s) /  ED Diagnoses   Final diagnoses:  None    ED Discharge Orders    None       Drenda Freeze, MD 10/02/17 [phone]

## 2017-10-02 NOTE — ED Triage Notes (Signed)
Pt reports palpitations for 1 week, admits to ED tonight cause it's not getting better. Denies pain at this time. SHOB with activity.

## 2017-10-07 ENCOUNTER — Encounter (HOSPITAL_COMMUNITY): Payer: Self-pay | Admitting: *Deleted

## 2017-10-07 ENCOUNTER — Emergency Department (HOSPITAL_COMMUNITY)
Admission: EM | Admit: 2017-10-07 | Discharge: 2017-10-07 | Disposition: A | Discharge status: Home / Self Care | Payer: No Typology Code available for payment source | Attending: Emergency Medicine | Admitting: Emergency Medicine

## 2017-10-07 ENCOUNTER — Other Ambulatory Visit: Payer: Self-pay

## 2017-10-07 ENCOUNTER — Emergency Department (HOSPITAL_COMMUNITY): Payer: No Typology Code available for payment source

## 2017-10-07 DIAGNOSIS — Z79899 Other long term (current) drug therapy: Secondary | ICD-10-CM | POA: Insufficient documentation

## 2017-10-07 DIAGNOSIS — E039 Hypothyroidism, unspecified: Secondary | ICD-10-CM | POA: Insufficient documentation

## 2017-10-07 DIAGNOSIS — I10 Essential (primary) hypertension: Secondary | ICD-10-CM | POA: Insufficient documentation

## 2017-10-07 DIAGNOSIS — H6690 Otitis media, unspecified, unspecified ear: Secondary | ICD-10-CM | POA: Insufficient documentation

## 2017-10-07 MED ORDER — AMOXICILLIN-POT CLAVULANATE 875-125 MG PO TABS: 1.0000 | ORAL_TABLET | Freq: Two times a day (BID) | ORAL | 0 refills | Status: DC

## 2017-10-07 NOTE — ED Provider Notes (Signed)
[facility] EMERGENCY DEPARTMENT Provider Note   CSN: [id number] Arrival date & time: 10/07/17  1112     History   Chief Complaint Chief Complaint  Patient presents with  . Cough    HPI Alyssa Hall is a 32 y.o. female.  HPI   32 year old female presents today with complaints of cough.  Patient notes a 2-day history of nasal congestion, left-sided ear pain and cough.  She denies any objective fevers at home.  She reports some nausea and vomiting and generalized abdominal discomfort.  Patient did not receive a flu vaccine.  No medications prior to arrival, patient reports she takes Synthroid for hypothyroidism.    Past Medical History:  Diagnosis Date  . Anemia   . Anxiety   . Atrial tachycardia ([place])   . Depression   . Dizziness   . Fibroids   . Hypertension   . Hypothyroid   . Meniere's disease   . Thyrotoxicosis     Patient Active Problem List   Diagnosis Date Noted  . Migraine with aura and without status migrainosus, not intractable 05/17/2017  . Plantar fasciitis, right 05/17/2017  . Chronic low back pain 04/02/2017  . Muscle strain 03/28/2015  . 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 Healthcare maintenance 05/27/2013  . Meniere's disease 07/12/2012  . Dysmenorrhea 05/05/2012  . Iron deficiency anemia 02/04/2012  . Hypothyroidism (acquired) 01/14/2012  . Generalized anxiety disorder 01/14/2012    History reviewed. No pertinent surgical history.  OB History    Gravida Para Term Preterm AB Living   1       1     SAB TAB Ectopic Multiple Live Births     1             Home Medications    Prior to Admission medications   Medication Sig Start Date End Date Taking? Authorizing Provider  acetaminophen (TYLENOL) 325 MG tablet Take 650 mg by mouth every 6 (six) hours as needed for mild pain.     [provider]  amoxicillin-clavulanate (AUGMENTIN) 875-125 MG tablet Take 1 tablet by mouth every 12 (twelve) hours.  10/07/17   Larone Kliethermes, Dellis Filbert, PA-C  atenolol (TENORMIN) 50 MG tablet Take 1 tablet (50 mg total) by mouth daily. 02/13/17   Shela Leff, MD  ferrous sulfate 325 (65 FE) MG tablet Take 1 tablet (325 mg total) by mouth at bedtime. 10/02/17   Drenda Freeze, MD  levothyroxine (SYNTHROID, LEVOTHROID) 25 MCG tablet Take 1.5 tablets (37.5 mcg total) by mouth daily before breakfast. 03/30/17   Valinda Party, DO    Family History Family History  Problem Relation Age of Onset  . Heart failure Maternal Aunt     Social History Social History   Tobacco Use  . Smoking status: Never Smoker  . Smokeless tobacco: Never Used  Substance Use Topics  . Alcohol use: No    Alcohol/week: 0.0 oz  . Drug use: No     Allergies   Patient has no known allergies.   Review of Systems Review of Systems  All other systems reviewed and are negative.    Physical Exam Updated Vital Signs BP (!) 134/91 (BP Location: Right Arm)   Pulse (!) 106   Temp 98.8 F (37.1 C) (Oral)   Resp 18   LMP 09/03/2017   SpO2 100%   Physical Exam  Constitutional: She is oriented to person, place, and time. She appears well-developed and well-nourished.  HENT:  Head: Normocephalic and  atraumatic.  Mouth/Throat: Uvula is midline, oropharynx is clear and moist and mucous membranes are normal. No oropharyngeal exudate, posterior oropharyngeal edema, posterior oropharyngeal erythema or tonsillar abscesses. Tonsils are 1+ on the right. Tonsils are 1+ on the left. No tonsillar exudate.  Left TM erythematous and bulging, right slightly erythematous-external auditory canal bilateral normal  Eyes: Conjunctivae are normal. Pupils are equal, round, and reactive to light. Right eye exhibits no discharge. Left eye exhibits no discharge. No scleral icterus.  Neck: Normal range of motion. No JVD present. No tracheal deviation present.  Pulmonary/Chest: Effort normal. No stridor.  Abdominal: Soft.  Generalized minimal  abdominal tenderness, nonfocal  Neurological: She is alert and oriented to person, place, and time. Coordination normal.  Psychiatric: She has a normal mood and affect. Her behavior is normal. Judgment and thought content normal.  Nursing note and vitals reviewed.    ED Treatments / Results  Labs (all labs ordered are listed, but only abnormal results are displayed) Labs Reviewed - No data to display  EKG  EKG Interpretation None       Radiology Dg Chest 2 View  Result Date: 10/07/2017 CLINICAL DATA:  Headache, chest pain and cough. EXAM: CHEST  2 VIEW COMPARISON:  10/02/2017. FINDINGS: Trachea is midline. Heart size normal. Lungs are clear. No pleural fluid. IMPRESSION: Negative. Electronically Signed   By: Lorin Picket M.D.   On: 10/07/2017 12:49    Procedures Procedures (including critical care time)  Medications Ordered in ED Medications - No data to display   Initial Impression / Assessment and Plan / ED Course  I have reviewed the triage vital signs and the nursing notes.  Pertinent labs & imaging results that were available during my care of the patient were reviewed by me and considered in my medical decision making (see chart for details).      Final Clinical Impressions(s) / ED Diagnoses   Final diagnoses:  Acute otitis media, unspecified otitis media type   Labs:   Imaging: DG chest 2 view  Consults:  Therapeutics:  Discharge Meds: Augmentin  Assessment/Plan: Patient's presentation is most consistent with upper respiratory infection.  She has what appears to be acute otitis media on the left.  I encouraged symptomatic therapies with the addition of amoxicillin.  Patient is encouraged to follow-up as an outpatient with primary return immediately with any new or worsening signs or symptoms.  She verbalized understanding and agreement to today's plan had no further questions or concerns at the time of discharge.     ED Discharge Orders         Ordered    amoxicillin-clavulanate (AUGMENTIN) 875-125 MG tablet  Every 12 hours     10/07/17 1417       Okey Regal, PA-C 10/07/17 1417    Jola Schmidt, MD 10/07/17 [phone]

## 2017-10-07 NOTE — ED Triage Notes (Signed)
Pt was seen here on 1/25 for palpitations and n/v. Reports still having n/v but now has productive cough that is causing chest wall and abd pain when coughing. No acute distress is noted at triage.

## 2017-10-07 NOTE — Discharge Instructions (Signed)
Please read attached information. If you experience any new or worsening signs or symptoms please return to the emergency room for evaluation. Please follow-up with your primary care provider or specialist as discussed. Please use medication prescribed only as directed and discontinue taking if you have any concerning signs or symptoms.   °

## 2017-10-08 ENCOUNTER — Encounter: Payer: Self-pay | Admitting: Internal Medicine

## 2017-10-08 NOTE — Progress Notes (Deleted)
   CC: ***  HPI:  [name] is a 32 y.o.   Past Medical History:  Diagnosis Date  . Anemia   . Anxiety   . Atrial tachycardia ([place])   . Depression   . Dizziness   . Fibroids   . Hypertension   . Hypothyroid   . Meniere's disease   . Thyrotoxicosis     Review of Systems:  ***  Physical Exam:  There were no vitals filed for this visit. ***  Assessment & Plan:   See encounters tab for problem based medical decision making.  Assessment:  Iron deficiency anemia -CBC + ferritin  Assessment:  Hypothyroidism TSH was 3.2 on 08/2017.  Patient currently takes synthroid 37.67mcg.  She states she has been adherent with the 37.99mcg dose daily.   Plan - continue synthroid 37.68mcg    Patient {GC/GE:[id number]::"discussed with","seen with"} Dr. {NAMES:[id number]::"Butcher","Granfortuna","E. Zayan Delvecchio","Klima","Mullen","Narendra","Vincent"}

## 2017-10-09 ENCOUNTER — Other Ambulatory Visit: Payer: Self-pay | Admitting: *Deleted

## 2017-10-09 ENCOUNTER — Other Ambulatory Visit: Payer: Self-pay | Admitting: Internal Medicine

## 2017-10-09 MED ORDER — ATENOLOL 50 MG PO TABS: 50.0000 mg | ORAL_TABLET | Freq: Every day | ORAL | 1 refills | Status: DC

## 2017-10-14 NOTE — Progress Notes (Deleted)
   CC: ***  HPI:  Alyssa Hall is a 32 y.o.   Past Medical History:  Diagnosis Date  . Anemia   . Anxiety   . Atrial tachycardia ([place])   . Depression   . Dizziness   . Fibroids   . Hypertension   . Hypothyroid   . Meniere's disease   . Thyrotoxicosis     Review of Systems:  ***  Physical Exam:  There were no vitals filed for this visit. ***  Assessment & Plan:   See encounters tab for problem based medical decision making.  Assessment:  Hypothyroidism She states she has been compliant with synthroid 37.49mcg dose daily.  Will check TSH today.  Plan -TSH       Patient {GC/GE:[id number]::"discussed with","seen with"} Dr. {NAMES:[id number]::"Butcher","Granfortuna","E. Beulah Capobianco","Klima","Mullen","Narendra","Vincent"}

## 2017-10-15 ENCOUNTER — Encounter: Payer: Self-pay | Admitting: Internal Medicine

## 2017-11-30 ENCOUNTER — Encounter: Payer: Self-pay | Admitting: Internal Medicine

## 2017-11-30 ENCOUNTER — Ambulatory Visit: Payer: BLUE CROSS/BLUE SHIELD | Admitting: Internal Medicine

## 2017-11-30 DIAGNOSIS — I471 Supraventricular tachycardia: Secondary | ICD-10-CM | POA: Diagnosis not present

## 2017-11-30 DIAGNOSIS — H8109 Meniere's disease, unspecified ear: Secondary | ICD-10-CM

## 2017-11-30 DIAGNOSIS — Z79899 Other long term (current) drug therapy: Secondary | ICD-10-CM | POA: Diagnosis not present

## 2017-11-30 DIAGNOSIS — H8102 Meniere's disease, left ear: Secondary | ICD-10-CM | POA: Diagnosis not present

## 2017-11-30 NOTE — Patient Instructions (Addendum)
Thank you for visiting clinic today. Please start taking your hydrochlorothiazide 12.5 mg daily and see if that will help with your symptoms.  HCTZ or hydrochlorothiazide should not drop your blood pressure too much. Keep yourself well-hydrated. Take your medicine for about a week and if your symptoms fail to resolve you can make an appointment with your ENT.

## 2017-11-30 NOTE — Assessment & Plan Note (Signed)
Her symptoms were most likely related to her Mnire's disease. She was repeatedly told in the past to start taking HCTZ a it improves her symptomss.  I explained to patient again today that she should start taking HCTZ a it should not drop her blood pressures.  She is taking atenolol because of her atrial arrhythmias. She was also advised to contact her ENT if her symptoms does not improve within 1 week of starting HCTZ.

## 2017-11-30 NOTE — Progress Notes (Signed)
   CC: Ringing in left ear.  HPI:  Ms.Olive Jacquet is a 32 y.o. past medical history as listed below came to the clinic with complaint of ringing in her left ear.  This is going on for many months, variable in intensity, patient has seen ENT in the past with diagnosis of Mnire's disease and was given HCTZ 12.5 mg which helped with her symptoms.  Patient stopped taking HCTZ thinking that it might drop her blood pressure as she is taking atenolol because of her atrial tachycardia. She was told in the past repeatedly to continue HCTZ but she has not started it yet although she does has prescription in hand.  She was asking for a neurology referral-explained to her that neurology will not deal with Mnire's disease. She denies any fullness in ear, ear pain, hearing loss, headache or dizziness.  Past Medical History:  Diagnosis Date  . Anemia   . Anxiety   . Atrial tachycardia ([place])   . Depression   . Dizziness   . Fibroids   . Hypertension   . Hypothyroid   . Meniere's disease   . Thyrotoxicosis    Review of Systems: Negative except mentioned in  HPI.  Physical Exam:  Vitals:   11/30/17 1527  BP: 130/76  Pulse: 65  Temp: 98.2 F (36.8 C)  TempSrc: Oral  SpO2: 100%  Weight: 227 lb 12.8 oz (103.3 kg)    General: Vital signs reviewed.  Patient is well-developed and well-nourished, in no acute distress and cooperative with exam.  Head: Normocephalic and atraumatic. Eyes: EOMI, conjunctivae normal, no scleral icterus.  EAR.  Tympanic membrane appears normal with good light reflex, mild wax in both ears. Cardiovascular: RRR, S1 normal, S2 normal, no murmurs, gallops, or rubs. Pulmonary/Chest: Clear to auscultation bilaterally, no wheezes, rales, or rhonchi. Abdominal: Soft, non-tender, non-distended, BS +, no masses, organomegaly, or guarding present.  Extremities: No lower extremity edema bilaterally,  pulses symmetric and intact bilaterally. No cyanosis or clubbing. Skin:  Warm, dry and intact. No rashes or erythema. Psychiatric: Normal mood and affect. speech and behavior is normal. Cognition and memory are normal.  Assessment & Plan:   See Encounters Tab for problem based charting.  Patient discussed with Dr. Dareen Piano.

## 2017-12-02 NOTE — Addendum Note (Signed)
Addended by: Aldine Contes on: 12/02/2017 10:08 AM   Modules accepted: Level of Service

## 2017-12-02 NOTE — Progress Notes (Signed)
Internal Medicine Clinic Attending  Case discussed with Dr. Amin at the time of the visit.  We reviewed the resident's history and exam and pertinent patient test results.  I agree with the assessment, diagnosis, and plan of care documented in the resident's note.    

## 2017-12-23 ENCOUNTER — Telehealth: Payer: Self-pay | Admitting: Internal Medicine

## 2017-12-23 NOTE — Telephone Encounter (Signed)
Can you please let Alyssa Hall know that levothyroxine and tylenol is safe in pregnancy.  She needs to double the dose of levothyroxine for 2 out of the 7 days of the week.  The rest of the days she takes her usual dose.  She will need follow up with her cardiologist to discuss changing her atenolol as this may not be the best option for her.  She also needs follow up with her obgyn.   She will need an appointment to be seen in the next week to check her thryroid function in our clinic.  She also needs to be taking a pre-natal vitamin. Thanks

## 2017-12-23 NOTE — Telephone Encounter (Signed)
Called pt - stated she took 4 pregnancy test and all positive; so wants to know if meds she should not be taken. Thanks

## 2017-12-23 NOTE — Telephone Encounter (Signed)
Patient says that she maybe pregnant and doesn't want some of medicine to harm the baby. Pls contact patient

## 2017-12-24 ENCOUNTER — Encounter: Payer: Self-pay | Admitting: Internal Medicine

## 2017-12-24 ENCOUNTER — Ambulatory Visit: Payer: BLUE CROSS/BLUE SHIELD | Admitting: Internal Medicine

## 2017-12-24 VITALS — BP 139/76 | HR 71 | Temp 98.1°F | Ht 64.0 in | Wt 229.5 lb

## 2017-12-24 DIAGNOSIS — I471 Supraventricular tachycardia: Secondary | ICD-10-CM

## 2017-12-24 DIAGNOSIS — Z79890 Hormone replacement therapy (postmenopausal): Secondary | ICD-10-CM | POA: Diagnosis not present

## 2017-12-24 DIAGNOSIS — N912 Amenorrhea, unspecified: Secondary | ICD-10-CM | POA: Diagnosis not present

## 2017-12-24 DIAGNOSIS — Z862 Personal history of diseases of the blood and blood-forming organs and certain disorders involving the immune mechanism: Secondary | ICD-10-CM

## 2017-12-24 DIAGNOSIS — D500 Iron deficiency anemia secondary to blood loss (chronic): Secondary | ICD-10-CM

## 2017-12-24 DIAGNOSIS — E039 Hypothyroidism, unspecified: Secondary | ICD-10-CM | POA: Diagnosis not present

## 2017-12-24 DIAGNOSIS — Z79899 Other long term (current) drug therapy: Secondary | ICD-10-CM

## 2017-12-24 LAB — POCT URINE PREGNANCY: Preg Test, Ur: NEGATIVE

## 2017-12-24 NOTE — Progress Notes (Signed)
   CC: follow-up on hypothyroidism  HPI:  [name] is a 32 y.o. female with history noted below that presents to the internal medicine clinic for follow-up on hypothyroidism. Please see problem based charting for the status of patient's chronic medical conditions.  Past Medical History:  Diagnosis Date  . Anemia   . Anxiety   . Atrial tachycardia ([place])   . Depression   . Dizziness   . Fibroids   . Hypertension   . Hypothyroid   . Meniere's disease   . Thyrotoxicosis     Review of Systems:  Review of Systems  Respiratory: Negative for shortness of breath.   Cardiovascular: Negative for chest pain.  Neurological: Negative for tremors.     Physical Exam:  Vitals:   12/24/17 1507  BP: 139/76  Pulse: 71  Temp: 98.1 F (36.7 C)  TempSrc: Oral  SpO2: 100%  Weight: 229 lb 8 oz (104.1 kg)  Height: 5\' 4"  (1.626 m)   Physical Exam  Constitutional: She is well-developed, well-nourished, and in no distress.  Cardiovascular: Normal rate, regular rhythm and normal heart sounds. Exam reveals no gallop and no friction rub.  No murmur heard. Pulmonary/Chest: Effort normal and breath sounds normal. No respiratory distress. She has no wheezes. She has no rales.     Assessment & Plan:   See encounters tab for problem based medical decision making.   Patient discussed with Dr. Evette Doffing

## 2017-12-24 NOTE — Patient Instructions (Signed)
Alyssa Hall,  It was a pleasure seeing you today.  Please start taking a pre-natal vitamin.

## 2017-12-24 NOTE — Telephone Encounter (Signed)
Called pt - no answer; left message to give us a call back. 

## 2017-12-25 LAB — FERRITIN: FERRITIN: 30 ng/mL (ref 15–150)

## 2017-12-25 LAB — IRON AND TIBC
IRON SATURATION: 14 % — AB (ref 15–55)
IRON: 52 ug/dL (ref 27–159)
Total Iron Binding Capacity: 361 ug/dL (ref 250–450)
UIBC: 309 ug/dL (ref 131–425)

## 2017-12-25 LAB — TSH: TSH: 6.14 u[IU]/mL — ABNORMAL HIGH (ref 0.450–4.500)

## 2017-12-26 DIAGNOSIS — Z3490 Encounter for supervision of normal pregnancy, unspecified, unspecified trimester: Secondary | ICD-10-CM | POA: Insufficient documentation

## 2017-12-26 NOTE — Assessment & Plan Note (Signed)
Assessment: History of iron deficiency anemia Patient states she is currently taking iron and would like to check her iron levels today.  Plan -Iron and IBC -Ferritin

## 2017-12-26 NOTE — Assessment & Plan Note (Signed)
Assessment:  Amenorrhea  Patient states that at home she has taken several pregnancy tests and they have been positive.  An in office urine pregnancy test was negative today.  I evaluated the home pregnancy tests that the patient brought in and unfortunately the tests were not being interpreted correctly by the patient.  She states she has missed her period this month.  Patient is unable to tell me when the first day of her last period was. She does report some breast tenderness and feeling tired.  I recommended she continue to take pregnancy tests at home as it may just be she is taking the tests too early.  Patient states she is trying to get pregnant and we discussed how to best increase chances with tracking menstrual cycle and ovulation.  Recommended patient start a prenatal vitamin since she is not on one.  Patient is currently on atenolol for her atrial ectopic tachycardia.  This is a class D pregnancy medication.  Recommended she discuss being on the medication with her cardiologist since she is trying to get pregnant. She has a history of hypothyroidism and is on levothyroxine.  This is relatively safe in pregnancy and told to continue.  however it will be important to know when she gets pregnant as there needs to be dose adjustments.    Plan -pre-natal vitamin - recommended discussing atenolol with cardiologist - continue to take pregnancy tests at home - TSH

## 2017-12-26 NOTE — Assessment & Plan Note (Addendum)
Assessment:  Hypothyroidism Patient currently takes levothyroxine 37.5 mcg daily.  Will check TSH.    Plan -TSH

## 2017-12-28 NOTE — Telephone Encounter (Signed)
Pt saw Dr Heber  on 4/18.

## 2017-12-28 NOTE — Progress Notes (Signed)
Internal Medicine Clinic Attending  Case discussed with Dr. Hoffman at the time of the visit.  We reviewed the resident's history and exam and pertinent patient test results.  I agree with the assessment, diagnosis, and plan of care documented in the resident's note.  

## 2017-12-31 ENCOUNTER — Encounter: Payer: Self-pay | Admitting: Internal Medicine

## 2018-01-06 ENCOUNTER — Telehealth: Payer: Self-pay | Admitting: Internal Medicine

## 2018-01-06 NOTE — Telephone Encounter (Signed)
yes

## 2018-01-06 NOTE — Telephone Encounter (Signed)
Attempted to call patient several times.  Was not clear if answering machine was her.  Could you please let her know to take 2 tablets (57mcg of levothyroxine) as she is hypothyroid.  Her iron studies look better however still on the low normal side.  Please tell her to continue her iron pills as they are working for her.

## 2018-01-06 NOTE — Telephone Encounter (Addendum)
Pt called / informed " to take 2 tablets (31mcg of levothyroxine) as she is hypothyroid.  Her iron studies look better however still on the low normal side.  Please tell her to continue her iron pills as they are working for her." per Dr Heber Perry. Voiced understanding and repeated new instructions of levothyroxine dosage. Stated she's taking iron pills and prenatal vitamins - is this ok?

## 2018-01-06 NOTE — Telephone Encounter (Signed)
Attempted to call patient several times with lab results.  Will try again and if unable to contact will send to triage pool

## 2018-01-06 NOTE — Telephone Encounter (Signed)
Patient is calling regarding labs results, pls leave a message if patient doesn't answer

## 2018-01-07 NOTE — Telephone Encounter (Signed)
Pt called / informed ok to take iron pills and prenatal vitamins per Dr Heber [place].

## 2018-03-02 ENCOUNTER — Other Ambulatory Visit: Payer: Self-pay

## 2018-03-02 ENCOUNTER — Encounter: Payer: Self-pay | Admitting: Internal Medicine

## 2018-03-02 ENCOUNTER — Ambulatory Visit: Payer: BLUE CROSS/BLUE SHIELD | Admitting: Internal Medicine

## 2018-03-02 VITALS — BP 123/48 | HR 74 | Temp 98.3°F | Ht 64.0 in | Wt 233.7 lb

## 2018-03-02 DIAGNOSIS — Z79899 Other long term (current) drug therapy: Secondary | ICD-10-CM | POA: Diagnosis not present

## 2018-03-02 DIAGNOSIS — E039 Hypothyroidism, unspecified: Secondary | ICD-10-CM

## 2018-03-02 DIAGNOSIS — G8929 Other chronic pain: Secondary | ICD-10-CM | POA: Diagnosis not present

## 2018-03-02 DIAGNOSIS — Z8679 Personal history of other diseases of the circulatory system: Secondary | ICD-10-CM | POA: Diagnosis not present

## 2018-03-02 DIAGNOSIS — N921 Excessive and frequent menstruation with irregular cycle: Secondary | ICD-10-CM

## 2018-03-02 DIAGNOSIS — M545 Low back pain, unspecified: Secondary | ICD-10-CM

## 2018-03-02 DIAGNOSIS — N946 Dysmenorrhea, unspecified: Secondary | ICD-10-CM

## 2018-03-02 DIAGNOSIS — Z79890 Hormone replacement therapy (postmenopausal): Secondary | ICD-10-CM

## 2018-03-02 DIAGNOSIS — Z8742 Personal history of other diseases of the female genital tract: Secondary | ICD-10-CM

## 2018-03-02 NOTE — Patient Instructions (Signed)
Alyssa Hall  We will set up to see your gynecologist.  We will recheck your thyroid levels today and we will send the results to your endocrinologist.  Please continue to talk with your regular care physician about your joint pains and you can discuss any further work-up to both feel is necessary to look into.  Is go ahead and schedule a follow-up appointment with your regular physician at her next available appointment to continue discussions.

## 2018-03-02 NOTE — Assessment & Plan Note (Signed)
She reports being seen in the past at [facility] for heavy periods with clots as well as painful periods secondary to fibroids.  She was seen by Dr. Hulan Fray at their facility in October 2018.  She reports that she wishes to reestablish care with them.  We will get her reestablished with gynecology for follow-up.

## 2018-03-02 NOTE — Progress Notes (Signed)
   CC: Irregular menstrual cycle   HPI:  [name] is a 32 y.o. female with a past medical history listed below here today for follow up of her irregular menstrual cycle.  For details of today's visit and the status of her chronic medical issues please refer to the assessment and plan.   Past Medical History:  Diagnosis Date  . Anemia   . Anxiety   . Atrial tachycardia ([place])   . Depression   . Dizziness   . Fibroids   . Hypertension   . Hypothyroid   . Meniere's disease   . Thyrotoxicosis    Review of Systems:   No chest pain or shortness of breath  Physical Exam:  Vitals:   03/02/18 1517  BP: (!) 123/48  Pulse: 74  Temp: 98.3 F (36.8 C)  TempSrc: Oral  SpO2: 100%  Weight: 233 lb 11.2 oz (106 kg)  Height: 5\' 4"  (1.626 m)   GENERAL- alert, co-operative, appears as stated age, not in any distress. HEENT- Atraumatic, normocephalic CARDIAC- RRR, no murmurs, rubs or gallops. RESP- Clear to auscultation bilaterally, no wheezes or crackles. MSK -no joint abnormalities SKIN- Warm, dry, No rash or lesion. PSYCH- Normal mood and affect, appropriate thought content and speech.  Assessment & Plan:   See Encounters Tab for problem based charting.  Patient discussed with Dr. Angelia Mould

## 2018-03-02 NOTE — Assessment & Plan Note (Signed)
She reports today that she was sent by her chiropractor and was told to get blood checks for Lyme disease, lupus, arthritis (I presume rheumatoid).  She reports a long-standing history of low back pain but does mention that she has generalized joint pains.  He does have a history of rashes but reports being seen by a dermatologist in the past that told her that the rashes were nothing to worry about.  She has been seen by her primary care physician in the past for low back pain.  No obvious joint abnormalities today and no family history that she is aware of any of these diseases.  Plan: Would not recommend checking for Lyme disease regardless of her symptoms as she does not have any symptoms of acute Lyme disease.  No obvious things on exam today or on her history that would prompt me to further investigate for possible rheumatoid arthritis or lupus.  However, I did not discuss this in depth with her today and she can continue to have discussions about this with her primary care physician at subsequent visits.

## 2018-03-02 NOTE — Assessment & Plan Note (Signed)
She reports that after discussing with Dr. Heber [place] over the phone at her previous visit in April she began taking 2 of her levothyroxine 25 MCG pills daily.  Per Dr. Jodene Nam noted.  She wanted her to take 1 1/2 pills daily.  She reports that she is doing well without any issues and denies any current chest pain or shortness of breath.  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  She also notes that she is planning to establish with endocrinologist and is requesting her records faxed.  Plan  We will recheck her thyroid levels today and adjust her Synthroid dose accordingly

## 2018-03-03 LAB — TSH: TSH: 4.84 u[IU]/mL — AB (ref 0.450–4.500)

## 2018-03-03 NOTE — Progress Notes (Signed)
Internal Medicine Clinic Attending  Case discussed with Dr. Boswell at the time of the visit.  We reviewed the resident's history and exam and pertinent patient test results.  I agree with the assessment, diagnosis, and plan of care documented in the resident's note.  

## 2018-03-12 ENCOUNTER — Telehealth: Payer: Self-pay | Admitting: Internal Medicine

## 2018-03-12 MED ORDER — LEVOTHYROXINE SODIUM 75 MCG PO TABS: 75.0000 ug | ORAL_TABLET | Freq: Every day | ORAL | 5 refills | Status: DC

## 2018-03-12 NOTE — Telephone Encounter (Signed)
Called and spoke with patient, confirmed TSH of 4.8 was while she was taking 2 tablets of levothyroxine daily (65mcg dose), will have her increase dose to 63mcg daily.   She also wanted to make sure records have been faxed to Dr Cindra Eves office.

## 2018-03-12 NOTE — Telephone Encounter (Signed)
Pt is calling back

## 2018-03-12 NOTE — Telephone Encounter (Signed)
Pt calling about lab results, pt contact # [phone]

## 2018-03-29 ENCOUNTER — Ambulatory Visit (INDEPENDENT_AMBULATORY_CARE_PROVIDER_SITE_OTHER): Payer: BLUE CROSS/BLUE SHIELD | Admitting: Obstetrics & Gynecology

## 2018-03-29 ENCOUNTER — Encounter: Payer: Self-pay | Admitting: Obstetrics & Gynecology

## 2018-03-29 ENCOUNTER — Telehealth: Payer: Self-pay

## 2018-03-29 VITALS — BP 119/79 | HR 89 | Wt 233.8 lb

## 2018-03-29 DIAGNOSIS — N938 Other specified abnormal uterine and vaginal bleeding: Secondary | ICD-10-CM

## 2018-03-29 DIAGNOSIS — Z3201 Encounter for pregnancy test, result positive: Secondary | ICD-10-CM | POA: Diagnosis not present

## 2018-03-29 LAB — POCT PREGNANCY, URINE: Preg Test, Ur: POSITIVE — AB

## 2018-03-29 NOTE — Progress Notes (Signed)
   Subjective:    [name], female    DOB: 1986/01/27, 32 y.o.   MRN: [id number]  HPI 32 yo married P0 here for follow up for continued DUB. She would like a pregnancy. She has not bled for 3 weeks. Her u/s last year showed large and many fibroids. I advised her last year to see Dr. Kerin Perna She declined OCPs due to her desire for pregnancy.   Review of Systems Pap normal 10/16    Objective:   Physical Exam Breathing, conversing, and ambulating normally Morbidly obese, well hydrated Black female, no apparent distress Abd- benign Speculum exam reveals normal vagina, discharge. No blood seen. UPT lightly + test    Assessment & Plan:  + UPT- check QBHCG today and in 48 hours. Rec PNV daily

## 2018-03-29 NOTE — Addendum Note (Signed)
Addended by: Alric Seton on: 03/29/2018 11:27 AM   Modules accepted: Orders

## 2018-03-29 NOTE — Telephone Encounter (Signed)
Pt called wanting to know if she needed to make an appt for her HCG Beta. Dr. Hulan Fray advised her to come back in 2 days for labs. Advised will for to out [facility] team to call her to make appt. Pt verbalized understanding.

## 2018-03-30 ENCOUNTER — Encounter: Payer: Self-pay | Admitting: Internal Medicine

## 2018-03-30 ENCOUNTER — Other Ambulatory Visit: Payer: Self-pay

## 2018-03-30 ENCOUNTER — Ambulatory Visit: Payer: BLUE CROSS/BLUE SHIELD | Admitting: Internal Medicine

## 2018-03-30 VITALS — BP 134/80 | HR 82 | Temp 97.8°F | Ht 64.0 in | Wt 237.9 lb

## 2018-03-30 DIAGNOSIS — Z3200 Encounter for pregnancy test, result unknown: Secondary | ICD-10-CM

## 2018-03-30 DIAGNOSIS — Z79899 Other long term (current) drug therapy: Secondary | ICD-10-CM

## 2018-03-30 DIAGNOSIS — Z3A00 Weeks of gestation of pregnancy not specified: Secondary | ICD-10-CM

## 2018-03-30 DIAGNOSIS — E039 Hypothyroidism, unspecified: Secondary | ICD-10-CM

## 2018-03-30 DIAGNOSIS — Z79890 Hormone replacement therapy (postmenopausal): Secondary | ICD-10-CM

## 2018-03-30 DIAGNOSIS — D500 Iron deficiency anemia secondary to blood loss (chronic): Secondary | ICD-10-CM

## 2018-03-30 DIAGNOSIS — Z3401 Encounter for supervision of normal first pregnancy, first trimester: Secondary | ICD-10-CM

## 2018-03-30 DIAGNOSIS — N921 Excessive and frequent menstruation with irregular cycle: Secondary | ICD-10-CM

## 2018-03-30 DIAGNOSIS — D259 Leiomyoma of uterus, unspecified: Secondary | ICD-10-CM

## 2018-03-30 DIAGNOSIS — Z3491 Encounter for supervision of normal pregnancy, unspecified, first trimester: Secondary | ICD-10-CM

## 2018-03-30 DIAGNOSIS — I471 Supraventricular tachycardia: Secondary | ICD-10-CM

## 2018-03-30 LAB — BETA HCG QUANT (REF LAB): HCG QUANT: 111 m[IU]/mL

## 2018-03-30 MED ORDER — COMPLETENATE 29-1 MG PO CHEW: 1.0000 | CHEWABLE_TABLET | Freq: Every day | ORAL | 2 refills | Status: AC

## 2018-03-30 MED ORDER — METOPROLOL SUCCINATE ER 100 MG PO TB24: 100.0000 mg | ORAL_TABLET | Freq: Every day | ORAL | 0 refills | Status: DC

## 2018-03-30 NOTE — Patient Instructions (Addendum)
Please start your prenatal vitamin.  I will switch you to metoprolol for your atrial tachycardia as it has a superior safety profile compared to atenolol.  I have ordered some prenatal vitamins with iron in preparation for the pregnancy.  We will check your TSH and free T4 levels today.  Please follow up with your OBGYN tomorrow and bring your new medications with you to go over with her.  It would be a good idea to bring this sheet with you as well.

## 2018-03-30 NOTE — Progress Notes (Signed)
CC: follow up of hypothyroidism, atrial ectopic tachycardia, iron deficiency anemia, prenatal care  HPI:  [name] is a 32 y.o. female with PMH below.  She is here for a follow up for  follow up of hypothyroidism, atrial ectopic tachycardia, iron deficiency anemia in the setting of a new pregnancy and to address her medication list and prenatal care.   Please see A&P for status of the patient's chronic medical conditions  Past Medical History:  Diagnosis Date  . Anemia   . Anxiety   . Atrial tachycardia ([place])   . Depression   . Dizziness   . Fibroids   . Hypertension   . Hypothyroid   . Meniere's disease   . Thyrotoxicosis    Review of Systems:  ROS: Pulmonary: pt denies increased work of breathing, shortness of breath,  Cardiac: pt denies palpitations, chest pain,  Abdominal: pt denies abdominal pain, nausea, vomiting, or diarrhea  Physical Exam:  Vitals:   03/30/18 1517  BP: 134/80  Pulse: 82  Temp: 97.8 F (36.6 C)  TempSrc: Oral  SpO2: 100%  Weight: 237 lb 14.4 oz (107.9 kg)  Height: 5\' 4"  (1.626 m)   Physical Exam  Constitutional: No distress.  Cardiovascular: Normal rate, regular rhythm and normal heart sounds. Exam reveals no gallop and no friction rub.  No murmur heard. Pulmonary/Chest: Effort normal and breath sounds normal. No respiratory distress. She has no wheezes. She has no rales. She exhibits no tenderness.  Abdominal: Soft. Bowel sounds are normal. She exhibits no distension and no mass. There is no tenderness. There is no rebound and no guarding.  Neurological: She is alert.  Skin: She is not diaphoretic.    Social History   Socioeconomic History  . Marital status: Married    Spouse name: Not on file  . Number of children: Not on file  . Years of education: Not on file  . Highest education level: Not on file  Occupational History  . Not on file  Social Needs  . Financial resource strain: Not on file  . Food insecurity:   Worry: Not on file    Inability: Not on file  . Transportation needs:    Medical: Not on file    Non-medical: Not on file  Tobacco Use  . Smoking status: Never Smoker  . Smokeless tobacco: Never Used  Substance and Sexual Activity  . Alcohol use: No    Alcohol/week: 0.0 oz  . Drug use: No  . Sexual activity: Yes    Partners: Male    Birth control/protection: None  Lifestyle  . Physical activity:    Days per week: Not on file    Minutes per session: Not on file  . Stress: Not on file  Relationships  . Social connections:    Talks on phone: Not on file    Gets together: Not on file    Attends religious service: Not on file    Active member of club or organization: Not on file    Attends meetings of clubs or organizations: Not on file    Relationship status: Not on file  . Intimate partner violence:    Fear of current or ex partner: Not on file    Emotionally abused: Not on file    Physically abused: Not on file    Forced sexual activity: Not on file  Other Topics Concern  . Not on file  Social History Narrative   Uninsured.   Lives in [place].  Family History  Problem Relation Age of Onset  . Heart failure Maternal Aunt     Assessment & Plan:   See Encounters Tab for problem based charting.  Patient discussed with Dr. Daryll Drown

## 2018-03-31 ENCOUNTER — Other Ambulatory Visit: Payer: BLUE CROSS/BLUE SHIELD

## 2018-03-31 ENCOUNTER — Telehealth: Payer: Self-pay | Admitting: Internal Medicine

## 2018-03-31 DIAGNOSIS — Z3200 Encounter for pregnancy test, result unknown: Secondary | ICD-10-CM

## 2018-03-31 LAB — CMP14 + ANION GAP
ALT: 14 IU/L (ref 0–32)
ANION GAP: 17 mmol/L (ref 10.0–18.0)
AST: 19 IU/L (ref 0–40)
Albumin/Globulin Ratio: 1.4 (ref 1.2–2.2)
Albumin: 4.3 g/dL (ref 3.5–5.5)
Alkaline Phosphatase: 72 IU/L (ref 39–117)
BUN/Creatinine Ratio: 14 (ref 9–23)
BUN: 11 mg/dL (ref 6–20)
Bilirubin Total: <0.2 mg/dL (ref 0.0–1.2)
CALCIUM: 10.2 mg/dL (ref 8.7–10.2)
CO2: 23 mmol/L (ref 20–29)
CREATININE: 0.79 mg/dL (ref 0.57–1.00)
Chloride: 100 mmol/L (ref 96–106)
GFR calc Af Amer: 116 mL/min/{1.73_m2} (ref >59)
GFR, EST NON AFRICAN AMERICAN: 101 mL/min/{1.73_m2} (ref >59)
GLOBULIN, TOTAL: 3 g/dL (ref 1.5–4.5)
Glucose: 76 mg/dL (ref 65–99)
Potassium: 4.2 mmol/L (ref 3.5–5.2)
SODIUM: 140 mmol/L (ref 134–144)
Total Protein: 7.3 g/dL (ref 6.0–8.5)

## 2018-03-31 LAB — CBC
Hematocrit: 36.4 % (ref 34.0–46.6)
Hemoglobin: 11.2 g/dL (ref 11.1–15.9)
MCH: 24 pg — ABNORMAL LOW (ref 26.6–33.0)
MCHC: 30.8 g/dL — ABNORMAL LOW (ref 31.5–35.7)
MCV: 78 fL — AB (ref 79–97)
Platelets: 418 10*3/uL (ref 150–450)
RBC: 4.67 x10E6/uL (ref 3.77–5.28)
RDW: 16.2 % — ABNORMAL HIGH (ref 12.3–15.4)
WBC: 6.7 10*3/uL (ref 3.4–10.8)

## 2018-03-31 LAB — T4, FREE: Free T4: 1.39 ng/dL (ref 0.82–1.77)

## 2018-03-31 LAB — TSH: TSH: 2.27 u[IU]/mL (ref 0.450–4.500)

## 2018-03-31 NOTE — Assessment & Plan Note (Signed)
Checked TSH and free T4 today which are both within the normal range.  Her dosage was increased to 43mcg of synthroid about one month ago.    -continue synthroid 44mcg daily -monitor during pregnancy

## 2018-03-31 NOTE — Telephone Encounter (Signed)
Went over results of pts thyroid, liver, renal function, went over increase in red blood cell count and no longer needing IV iron.

## 2018-03-31 NOTE — Assessment & Plan Note (Addendum)
This is a chronic blood loss anemia secondary to the patient's fibroids.  Now that she is pregnant and will be important to try and optimize the patient's iron levels and improve her iron deficiency anemia.  She is not symptomatic today.  I repeated the CBC today and she seems to be responding to the iron.    -I will continue her oral iron therapy, I also added a prenatal vitamin with iron.  Given these results we can continue to monitor her iron levels and I see no indication for IV iron in the future as long as she is tolerating oral iron hemoglobin and iron levels remain in range. -I asked that she started taking some over-the-counter MiraLAX to prevent constipation from the iron and pregnancy -iron studies in one month

## 2018-03-31 NOTE — Assessment & Plan Note (Addendum)
This is her first child, her LNMP is difficult to assess due to abnormal uterine bleeding from her fibroids and irregular periods.  Her quant estimates about one month.  Pt now established with an OBGYN.  I will help get her started today and help with optimizing her medically.  I will continue her oral iron therapy, I also added a prenatal vitamin with iron.  We checked a repeat CBC today it appears she is responding well to the oral iron therapy.  We switched her beta-blocker from atenolol to metoprolol.

## 2018-03-31 NOTE — Assessment & Plan Note (Signed)
This issue is chronic and stable.  Her HR is well controlled on atenolol 50mg .    -Given her pregnancy and need for beta blocker we have switched her to metoprolol XL 100mg  which is the equivalent dosage and has a more favorable safety profile. Opted against labetalol as I fear this may drop her blood pressure too low to achieve the desired rate control.

## 2018-04-01 ENCOUNTER — Other Ambulatory Visit: Payer: Self-pay | Admitting: Obstetrics & Gynecology

## 2018-04-01 DIAGNOSIS — O039 Complete or unspecified spontaneous abortion without complication: Secondary | ICD-10-CM

## 2018-04-01 LAB — BETA HCG QUANT (REF LAB): hCG Quant: 89 m[IU]/mL

## 2018-04-01 NOTE — Telephone Encounter (Signed)
Records were faxed to Dr. Cindra Eves office.

## 2018-04-01 NOTE — Progress Notes (Signed)
QBHCG ordered 

## 2018-04-01 NOTE — Telephone Encounter (Signed)
I gave her the results of her decreasing QBHCG.  She will need to call the office and make an appointment to get a follow up [facility] in about 2 weeks.

## 2018-04-01 NOTE — Progress Notes (Signed)
Internal Medicine Clinic Attending  Case discussed with Dr. Winfrey  at the time of the visit.  We reviewed the resident's history and exam and pertinent patient test results.  I agree with the assessment, diagnosis, and plan of care documented in the resident's note.  

## 2018-04-02 ENCOUNTER — Other Ambulatory Visit: Payer: Self-pay | Admitting: Internal Medicine

## 2018-04-12 ENCOUNTER — Encounter (HOSPITAL_COMMUNITY): Payer: Self-pay | Admitting: Emergency Medicine

## 2018-04-12 ENCOUNTER — Emergency Department (HOSPITAL_COMMUNITY)
Admission: EM | Admit: 2018-04-12 | Discharge: 2018-04-12 | Disposition: A | Discharge status: Home / Self Care | Payer: BLUE CROSS/BLUE SHIELD | Attending: Emergency Medicine | Admitting: Emergency Medicine

## 2018-04-12 DIAGNOSIS — O034 Incomplete spontaneous abortion without complication: Secondary | ICD-10-CM | POA: Insufficient documentation

## 2018-04-12 DIAGNOSIS — Z79899 Other long term (current) drug therapy: Secondary | ICD-10-CM | POA: Insufficient documentation

## 2018-04-12 DIAGNOSIS — R1030 Lower abdominal pain, unspecified: Secondary | ICD-10-CM | POA: Diagnosis present

## 2018-04-12 DIAGNOSIS — E039 Hypothyroidism, unspecified: Secondary | ICD-10-CM | POA: Insufficient documentation

## 2018-04-12 DIAGNOSIS — I10 Essential (primary) hypertension: Secondary | ICD-10-CM | POA: Diagnosis not present

## 2018-04-12 LAB — BASIC METABOLIC PANEL
Anion gap: 7 (ref 5–15)
BUN: 7 mg/dL (ref 6–20)
CALCIUM: 10 mg/dL (ref 8.9–10.3)
CHLORIDE: 105 mmol/L (ref 98–111)
CO2: 27 mmol/L (ref 22–32)
CREATININE: 0.84 mg/dL (ref 0.44–1.00)
GFR calc non Af Amer: >60 mL/min (ref >60)
GLUCOSE: 114 mg/dL — AB (ref 70–99)
Potassium: 4.2 mmol/L (ref 3.5–5.1)
Sodium: 139 mmol/L (ref 135–145)

## 2018-04-12 LAB — CBC
HEMATOCRIT: 40.3 % (ref 36.0–46.0)
HEMOGLOBIN: 12.2 g/dL (ref 12.0–15.0)
MCH: 24.4 pg — AB (ref 26.0–34.0)
MCHC: 30.3 g/dL (ref 30.0–36.0)
MCV: 80.8 fL (ref 78.0–100.0)
Platelets: 381 10*3/uL (ref 150–400)
RBC: 4.99 MIL/uL (ref 3.87–5.11)
RDW: 17.2 % — ABNORMAL HIGH (ref 11.5–15.5)
WBC: 8.2 10*3/uL (ref 4.0–10.5)

## 2018-04-12 LAB — URINALYSIS, ROUTINE W REFLEX MICROSCOPIC
Bilirubin Urine: NEGATIVE
Glucose, UA: NEGATIVE mg/dL
KETONES UR: NEGATIVE mg/dL
Leukocytes, UA: NEGATIVE
NITRITE: NEGATIVE
Protein, ur: NEGATIVE mg/dL
SPECIFIC GRAVITY, URINE: 1.002 — AB (ref 1.005–1.030)
pH: 6 (ref 5.0–8.0)

## 2018-04-12 LAB — WET PREP, GENITAL
Clue Cells Wet Prep HPF POC: NONE SEEN
SPERM: NONE SEEN
Trich, Wet Prep: NONE SEEN
YEAST WET PREP: NONE SEEN

## 2018-04-12 LAB — ABO/RH: ABO/RH(D): O POS

## 2018-04-12 LAB — HCG, QUANTITATIVE, PREGNANCY: hCG, Beta Chain, Quant, S: 18 m[IU]/mL — ABNORMAL HIGH (ref <5)

## 2018-04-12 LAB — GC/CHLAMYDIA PROBE AMP (~~LOC~~) NOT AT ARMC
CHLAMYDIA, DNA PROBE: NEGATIVE
NEISSERIA GONORRHEA: NEGATIVE

## 2018-04-12 NOTE — ED Notes (Signed)
Provider currently at bedside.

## 2018-04-12 NOTE — ED Provider Notes (Signed)
[place] EMERGENCY DEPARTMENT Provider Note   CSN: [id number] Arrival date & time: 04/12/18  0026    History   Chief Complaint Chief Complaint  Patient presents with  . Possible Pregnancy  . Abdominal Cramping    HPI Alyssa Hall is a 32 y.o. female.  32 year old female presents to the emergency department for evaluation of lower abdominal cramping.  She states that cramping began 4 days ago when her vaginal bleeding started.  Her last menstrual period was the first week of July.  She saw her doctor at the end of the month and had an hCG elevated to 111.  2 days later, hCG was 89.  She is concerned about her bleeding and thinks she may be having a miscarriage.  She does endorse taking multiple home pregnancy tests between her OB/GYN appointment and today which read positive.  She has not had any fevers, nausea, vomiting, urinary symptoms.  Denies any prior pregnancies.  No hx of abdominal surgeries.     Past Medical History:  Diagnosis Date  . Anemia   . Anxiety   . Atrial tachycardia ([place])   . Depression   . Dizziness   . Fibroids   . Hypertension   . Hypothyroid   . Meniere's disease   . Thyrotoxicosis     Patient Active Problem List   Diagnosis Date Noted  . Morbid obesity ([place]) 03/29/2018  . Prenatal care 12/26/2017  . Amenorrhea 12/24/2017  . Migraine with aura and without status migrainosus, not intractable 05/17/2017  . Plantar fasciitis, right 05/17/2017  . Chronic low back pain 04/02/2017  . Muscle strain 03/28/2015  . 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 Healthcare maintenance 05/27/2013  . Meniere's disease 07/12/2012  . Dysmenorrhea 05/05/2012  . Iron deficiency anemia 02/04/2012  . Hypothyroidism (acquired) 01/14/2012  . Generalized anxiety disorder 01/14/2012    History reviewed. No pertinent surgical history.   OB History    Gravida  1   Para      Term      Preterm      AB  1   Living        SAB     TAB  1   Ectopic      Multiple      Live Births               Home Medications    Prior to Admission medications   Medication Sig Start Date End Date Taking? Authorizing Provider  acetaminophen (TYLENOL) 325 MG tablet Take 650 mg by mouth every 6 (six) hours as needed for mild pain.    Yes [provider]  ferrous sulfate 325 (65 FE) MG tablet Take 1 tablet (325 mg total) by mouth at bedtime. 10/02/17  Yes Drenda Freeze, MD  levothyroxine (SYNTHROID) 75 MCG tablet Take 1 tablet (75 mcg total) by mouth daily before breakfast. 03/12/18 09/08/18 Yes Lucious Groves, DO  metoprolol succinate (TOPROL-XL) 100 MG 24 hr tablet Take 1 tablet (100 mg total) by mouth daily. Take with or immediately following a meal. 03/30/18  Yes Winfrey, Jenne Pane, MD  prenatal vitamin w/FE, FA (NATACHEW) 29-1 MG CHEW chewable tablet Chew 1 tablet by mouth daily at 12 noon. Patient not taking: Reported on 04/12/2018 03/30/18   Katherine Roan, MD    Family History Family History  Problem Relation Age of Onset  . Heart failure Maternal Aunt     Social History Social History   Tobacco  Use  . Smoking status: Never Smoker  . Smokeless tobacco: Never Used  Substance Use Topics  . Alcohol use: No    Alcohol/week: 0.0 oz  . Drug use: No     Allergies   Patient has no known allergies.   Review of Systems Review of Systems Ten systems reviewed and are negative for acute change, except as noted in the HPI.    Physical Exam Updated Vital Signs BP (!) 156/93 (BP Location: Right Arm)   Pulse (!) 101   Temp 97.7 F (36.5 C) (Oral)   Resp 18   Ht 5\' 4"  (1.626 m)   Wt 107.5 kg (237 lb)   LMP 03/16/2018   SpO2 100%   BMI 40.68 kg/m   Physical Exam  Constitutional: She is oriented to person, place, and time. She appears well-developed and well-nourished. No distress.  Nontoxic appearing and in NAD  HENT:  Head: Normocephalic and atraumatic.  Eyes: Conjunctivae and EOM are  normal. No scleral icterus.  Neck: Normal range of motion.  Pulmonary/Chest: Effort normal. No stridor. No respiratory distress. She has no wheezes.  Respirations even and unlabored  Abdominal: Soft. She exhibits no mass. There is tenderness (LLQ and suprapubic TTP). There is no guarding.  Soft, obese abdomen with TTP in the LLQ. No peritoneal signs.  Genitourinary: There is no rash, tenderness or lesion on the right labia. There is no rash, tenderness or lesion on the left labia. Uterus is tender. Cervix exhibits no motion tenderness. Right adnexum displays no mass. Left adnexum displays no mass. There is bleeding (blood in vaginal vault) in the vagina.  Genitourinary Comments: Uterus is more firm, tender. Cervical os is open with active bleeding coming from os. Blood and clots in vault. No distinct adnexal TTP appreciated.  Musculoskeletal: Normal range of motion.  Neurological: She is alert and oriented to person, place, and time. She exhibits normal muscle tone. Coordination normal.  Skin: Skin is warm and dry. No rash noted. She is not diaphoretic. No erythema. No pallor.  Psychiatric: She has a normal mood and affect. Her behavior is normal.  Nursing note and vitals reviewed.    ED Treatments / Results  Labs (all labs ordered are listed, but only abnormal results are displayed) Labs Reviewed  WET PREP, GENITAL - Abnormal; Notable for the following components:      Result Value   WBC, Wet Prep HPF POC FEW (*)    All other components within normal limits  URINALYSIS, ROUTINE W REFLEX MICROSCOPIC - Abnormal; Notable for the following components:   Specific Gravity, Urine 1.002 (*)    Hgb urine dipstick LARGE (*)    Bacteria, UA RARE (*)    All other components within normal limits  CBC - Abnormal; Notable for the following components:   MCH 24.4 (*)    RDW 17.2 (*)    All other components within normal limits  BASIC METABOLIC PANEL - Abnormal; Notable for the following components:    Glucose, Bld 114 (*)    All other components within normal limits  HCG, QUANTITATIVE, PREGNANCY - Abnormal; Notable for the following components:   hCG, Beta Chain, Quant, S 18 (*)    All other components within normal limits  ABO/RH  GC/CHLAMYDIA PROBE AMP ([place]) NOT AT [facility]    EKG None  Radiology No results found.  Procedures Procedures (including critical care time)  Medications Ordered in ED Medications - No data to display   Initial Impression / Assessment  and Plan / ED Course  I have reviewed the triage vital signs and the nursing notes.  Pertinent labs & imaging results that were available during my care of the patient were reviewed by me and considered in my medical decision making (see chart for details).     32 year old female presents to the emergency department for abdominal cramping and vaginal bleeding which began 4 days ago.  She was seen outpatient by her OB/GYN with a positive pregnancy test on 03/29/2018.  On chart review, hCG at this time was 111.  2 days later, it was found to have decreased to 89.  Today the patient has an hCG of 18.  Her symptoms are consistent with incomplete abortion.  Vital signs have been stable.  Hemoglobin is reassuring.  Have discussed close outpatient OB/GYN follow-up as well as bleeding precautions.  Patient discharged in stable condition with no unaddressed concerns.   Final Clinical Impressions(s) / ED Diagnoses   Final diagnoses:  Incomplete abortion    ED Discharge Orders    None       Antonietta Breach, PA-C 04/12/18 0559    Veryl Speak, MD 04/12/18 [phone]

## 2018-04-12 NOTE — ED Triage Notes (Signed)
Pt presents to ER with abd cramping and L flank pain x 3 days ago; took pregnacy test which was positive, G1P0; pt also currently having vaginal bleeding also x 4 days

## 2018-04-12 NOTE — Discharge Instructions (Signed)
You may take tylenol or ibuprofen for abdominal pain/cramping. Your symptoms are consistent with a miscarriage. Follow up with your OBGYN. If you develop fever, lightheadedness, worsening pain, increased bleeding (soaking through 1 or more pads per hour for 3-4 consecutive hours), return to the ED or [facility] for repeat evaluation.

## 2018-04-29 ENCOUNTER — Ambulatory Visit: Payer: Self-pay | Admitting: Internal Medicine

## 2018-04-29 ENCOUNTER — Encounter: Payer: Self-pay | Admitting: Internal Medicine

## 2018-04-29 VITALS — BP 135/86 | HR 90 | Temp 98.6°F | Wt 233.1 lb

## 2018-04-29 DIAGNOSIS — F419 Anxiety disorder, unspecified: Secondary | ICD-10-CM

## 2018-04-29 DIAGNOSIS — F458 Other somatoform disorders: Secondary | ICD-10-CM

## 2018-04-29 DIAGNOSIS — R0602 Shortness of breath: Secondary | ICD-10-CM

## 2018-04-29 DIAGNOSIS — Z79890 Hormone replacement therapy (postmenopausal): Secondary | ICD-10-CM

## 2018-04-29 DIAGNOSIS — Z79899 Other long term (current) drug therapy: Secondary | ICD-10-CM

## 2018-04-29 DIAGNOSIS — Z8679 Personal history of other diseases of the circulatory system: Secondary | ICD-10-CM

## 2018-04-29 DIAGNOSIS — R0789 Other chest pain: Secondary | ICD-10-CM

## 2018-04-29 DIAGNOSIS — E039 Hypothyroidism, unspecified: Secondary | ICD-10-CM

## 2018-04-29 MED ORDER — LABETALOL HCL 100 MG PO TABS: 100.0000 mg | ORAL_TABLET | Freq: Two times a day (BID) | ORAL | 2 refills | Status: DC

## 2018-04-29 NOTE — Progress Notes (Signed)
   CC: chest tightness and globus sensation  HPI:  [name] is a 32 y.o. female with history noted below that presents to the Internal Medicine Clinic for chest tightness and globus sensation.  Please see problem based charting for the status of patient's chronic medical conditions.  Past Medical History:  Diagnosis Date  . Anemia   . Anxiety   . Atrial tachycardia ([place])   . Depression   . Dizziness   . Fibroids   . Hypertension   . Hypothyroid   . Meniere's disease   . Thyrotoxicosis     Review of Systems:  Review of Systems  Constitutional: Negative for chills and fever.  Respiratory: Positive for shortness of breath. Negative for cough, sputum production and wheezing.   Cardiovascular: Negative for chest pain, orthopnea and leg swelling.  Gastrointestinal: Negative for nausea and vomiting.     Physical Exam:  Vitals:   04/29/18 1409  BP: 135/86  Pulse: 90  Temp: 98.6 F (37 C)  TempSrc: Oral  SpO2: 100%  Weight: 233 lb 1.6 oz (105.7 kg)   Physical Exam  Constitutional: She is well-developed, well-nourished, and in no distress.  Cardiovascular: Normal rate, regular rhythm and normal heart sounds. Exam reveals no gallop and no friction rub.  No murmur heard. Pulmonary/Chest: Effort normal and breath sounds normal. No respiratory distress. She has no wheezes. She has no rales.     Assessment & Plan:   See encounters tab for problem based medical decision making.   Patient discussed with Dr. Beryle Beams

## 2018-04-29 NOTE — Patient Instructions (Addendum)
Alyssa Hall,  It was a pleasure seeing you today. Please start taking labetalol 100 mg twice a day. Please follow-up in 2 weeks.

## 2018-04-30 LAB — TSH: TSH: 3.19 u[IU]/mL (ref 0.450–4.500)

## 2018-05-01 DIAGNOSIS — R0789 Other chest pain: Secondary | ICD-10-CM | POA: Insufficient documentation

## 2018-05-01 NOTE — Assessment & Plan Note (Addendum)
HPI:  Patient reports a 3 week history of chest tightness and globus sensation .  She reports her symptoms can last all day and nothing seems to help her symptoms.  She attributes her symptoms to a medication change. Patient was switched from atenolol to metoprolol xl last visit 03/30/2018 due to being pregnant.    Assessment:  Chest tightness Patient has a history of atrial ectopic tachycardia and states that her symptoms are not similar to previous tachycardia events. Not sure if metoprolol is the culprit to patient's symptoms.  Patient had a miscarriage approx 2 weeks ago and I am concerned her symptoms are related to anxiety.from the event. Offered anti-anxiety medication however patient declined and states she would like to change her beta-blocker.     Plan -stop metoprolol -start labetalol  -follow up in 2 weeks

## 2018-05-01 NOTE — Assessment & Plan Note (Signed)
Assessment:  Hypothyroidism Patient currently takes levothyroxine 75 mcg daily.  Will check TSH due to new symptoms of anxiety.   Plan -TSH

## 2018-05-03 NOTE — Progress Notes (Signed)
Medicine attending: Medical history, presenting problems, physical findings, and medications, reviewed with resident physician Dr Jessica Hoffman on the day of the patient visit and I concur with her evaluation and management plan. 

## 2018-05-17 ENCOUNTER — Other Ambulatory Visit: Payer: Self-pay

## 2018-05-17 ENCOUNTER — Ambulatory Visit: Payer: Self-pay | Admitting: Obstetrics & Gynecology

## 2018-05-17 DIAGNOSIS — O039 Complete or unspecified spontaneous abortion without complication: Secondary | ICD-10-CM

## 2018-05-18 LAB — BETA HCG QUANT (REF LAB)

## 2018-05-21 ENCOUNTER — Encounter: Payer: Self-pay | Admitting: Cardiovascular Disease

## 2018-05-21 ENCOUNTER — Ambulatory Visit (INDEPENDENT_AMBULATORY_CARE_PROVIDER_SITE_OTHER): Payer: Self-pay | Admitting: Cardiovascular Disease

## 2018-05-21 VITALS — BP 112/82 | HR 93 | Ht 64.0 in | Wt 234.0 lb

## 2018-05-21 DIAGNOSIS — I491 Atrial premature depolarization: Secondary | ICD-10-CM

## 2018-05-21 DIAGNOSIS — E039 Hypothyroidism, unspecified: Secondary | ICD-10-CM

## 2018-05-21 DIAGNOSIS — R002 Palpitations: Secondary | ICD-10-CM

## 2018-05-21 DIAGNOSIS — I10 Essential (primary) hypertension: Secondary | ICD-10-CM

## 2018-05-21 NOTE — Progress Notes (Signed)
Cardiology Office Note   Date:  05/21/2018   ID:  Alyssa Hall, DOB July 16, 1986, MRN [id number]  PCP:  Valinda Party, DO  Cardiologist:   Skeet Latch, MD   No chief complaint on file.     [name] is a 32 y.o. female with HTN, atrial tachycardia, and hypothyroidism who presents for follow up on her palpitations.    History of Present Illness: [name] was first evaluated 05/2015 with palpitations. She had a Holter that revealed sinus arrhythmia and one PAC but no other abnormalities. She was treated on atenolol and then digoxin without improvement in her symptoms.  The episodes were associated with shortness of breath, lightheadedness, and dizziness. They frequently we occurred in the setting of stressful situations.  She was subsequently started on Zoloft in October 2016 but did not start taking until November. She also received lorazepam and was referred for counselling.  She was seen in the emergency department after having a panic attack while driving her car. At the time she felt dizzy and had numbness and tingling in her hand. She left without being seen but then returned on 09/05/59 with palpitations. Her EKG at that time revealed atrial tachycardia with a rate of 106 bpm.  She followed up with her primary care physician, at which time atenolol 50 mg daily was started.  Her TSH was within normal limits.  [name] saw Dr. Curt Bears on 10/2015 and he felt that she would not be a good candidate for ablation of her atrial tachycardia because the source was in the low atrial septum and posterior.  At her last appointment atenolol was discontinued due to hypotension and dizziness.  She initially stopped the atenolol but then her BP was 150/90 so she started back.  Since her last appointment she followed up with her PCP and was switched from atenolol to metoprolol.  However she felt poorly on this medication and was subsequently switched to labetalol.  Since  that time she feels palpitations especially when she goes from a seated position to standing.  She gets lightheaded and dizzy but has no syncope.  She had an episode of chest tightness that occurred while at work.  At the time she was taking metoprolol.  Since then she has not had any more chest tightness.  Her breathing has been stable and she has no lower extremity edema, orthopnea, or PND.  Her lightheadedness last for approximately 5 minutes before it subsides.  She has not been exercising.  She struggles with eating too much rice.  Since her last appointment [name] got married.  She was seen in the ED 04/12/18 with chest tightness.     Past Medical History:  Diagnosis Date  . Anemia   . Anxiety   . Atrial tachycardia ([place])   . Depression   . Dizziness   . Fibroids   . Hypertension   . Hypothyroid   . Meniere's disease   . Thyrotoxicosis     History reviewed. No pertinent surgical history.   Current Outpatient Medications  Medication Sig Dispense Refill  . acetaminophen (TYLENOL) 325 MG tablet Take 650 mg by mouth every 6 (six) hours as needed for mild pain.     . ferrous sulfate 325 (65 FE) MG tablet Take 1 tablet (325 mg total) by mouth at bedtime. 30 tablet 3  . labetalol (NORMODYNE) 100 MG tablet Take 1 tablet (100 mg total) by mouth 2 (two) times daily. 60 tablet 2  .  levothyroxine (SYNTHROID) 75 MCG tablet Take 1 tablet (75 mcg total) by mouth daily before breakfast. 30 tablet 5  . prenatal vitamin w/FE, FA (NATACHEW) 29-1 MG CHEW chewable tablet Chew 1 tablet by mouth daily at 12 noon. 90 tablet 2   No current facility-administered medications for this visit.     Allergies:   Patient has no known allergies.    Social History:  The patient  reports that she has never smoked. She has never used smokeless tobacco. She reports that she does not drink alcohol or use drugs.   Family History:  The patient's family history includes Heart failure in her maternal aunt.     ROS:  Please see the history of present illness.   Otherwise, review of systems are positive for tingling in legs when she sleeps.   All other systems are reviewed and negative.    PHYSICAL EXAM: VS:  BP 112/82   Pulse 93   Ht 5\' 4"  (1.626 m)   Wt 234 lb (106.1 kg)   BMI 40.17 kg/m  , BMI Body mass index is 40.17 kg/m. GENERAL:  Well appearing HEENT: Pupils equal round and reactive, fundi not visualized, oral mucosa unremarkable NECK:  No jugular venous distention, waveform within normal limits, carotid upstroke brisk and symmetric, no bruits LUNGS:  Clear to auscultation bilaterally HEART:  RRR.  PMI not displaced or sustained,S1 and S2 within normal limits, no S3, no S4, no clicks, no rubs, mo murmurs ABD:  Flat, positive bowel sounds normal in frequency in pitch, no bruits, no rebound, no guarding, no midline pulsatile mass, no hepatomegaly, no splenomegaly EXT:  2 plus pulses throughout, no edema, no cyanosis no clubbing SKIN:  No rashes no nodules NEURO:  Cranial nerves II through XII grossly intact, motor grossly intact throughout PSYCH:  Cognitively intact, oriented to person place and time   EKG:  EKG is ordered today. 01/16/17: Ectopic atrial rhythm.  Rate 92 bpm.  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 05/21/18: :Ectopic trial rhythm.  Rate 93 bpm.    TTE 07/2014: EF 55-60%.  E-A fusion.  Recent Labs: 03/30/2018: ALT 14 04/12/2018: BUN 7; Creatinine, Ser 0.84; Hemoglobin 12.2; Platelets 381; Potassium 4.2; Sodium 139 04/29/2018: TSH 3.190    Lipid Panel    Component Value Date/Time   CHOL 156 09/11/2015 1625   TRIG 57 09/11/2015 1625   HDL 55 09/11/2015 1625   CHOLHDL 2.8 09/11/2015 1625   LDLCALC 90 09/11/2015 1625      Wt Readings from Last 3 Encounters:  05/21/18 234 lb (106.1 kg)  04/29/18 233 lb 1.6 oz (105.7 kg)  04/12/18 237 lb (107.5 kg)     Other studies Reviewed: Additional studies/ records that were reviewed today include:  Review of the  above records demonstrates:  Please see elsewhere in the note.    24 Hour Holter Monitor 04/27/15:  Quality: Fair. Baseline artifact. Predominant rhythm: sinus rhythms Other rhythms: multiple episodes of sinus arrhythmia, one episode of ectopic atrial rhythm at 75 bpm,  Average heart rate: 78 bpm Max heart rate: 125 bpm Min heart rate: 65 bpm Pauses >2.5 seconds: 0 Ventricular ectopics: 0 Patient did not submit a symptom diary  ASSESSMENT AND PLAN:  # Hypertension: # Dizziness:  # Ectopic atrial rhythm: # Palpitations: Symptoms are not as well-controlled.  She was previously on atenolol and ivabradine.  However she is trying to have a family.  She is doing well enough on labetalol.  She remains uninterested in  ablations at this time.  # Hypothyroidism: Thyroid medication has been adjusted and her TSH is within normal limits.  Current medicines are reviewed at length with the patient today.  The patient does not have concerns regarding medicines.  The following changes have been made:  Stop atenolol.  None  Labs/ tests ordered today include: none  No orders of the defined types were placed in this encounter.    Disposition:   FU with Dr. Jonelle Sidle C. [place] in 6 months.    Signed, Skeet Latch, MD  05/21/2018 1:33 PM    [facility]

## 2018-05-21 NOTE — Patient Instructions (Signed)
Medication Instructions:  Your physician recommends that you continue on your current medications as directed. Please refer to the Current Medication list given to you today.  Labwork: none  Testing/Procedures: none  Follow-Up: Your physician wants you to follow-up in: 6 months  You will receive a reminder letter in the mail two months in advance. If you don't receive a letter, please call our office to schedule the follow-up appointment.  If you need a refill on your cardiac medications before your next appointment, please call your pharmacy.  

## 2018-05-26 ENCOUNTER — Ambulatory Visit: Payer: Self-pay

## 2018-06-10 ENCOUNTER — Ambulatory Visit: Payer: Self-pay | Admitting: Internal Medicine

## 2018-06-10 ENCOUNTER — Ambulatory Visit (HOSPITAL_COMMUNITY)
Admission: RE | Admit: 2018-06-10 | Discharge: 2018-06-10 | Disposition: A | Discharge status: Home / Self Care | Payer: Self-pay | Source: Ambulatory Visit | Attending: Internal Medicine | Admitting: Internal Medicine

## 2018-06-10 ENCOUNTER — Encounter: Payer: Self-pay | Admitting: Internal Medicine

## 2018-06-10 VITALS — BP 136/85 | HR 93 | Temp 98.7°F | Wt 233.0 lb

## 2018-06-10 DIAGNOSIS — M25571 Pain in right ankle and joints of right foot: Secondary | ICD-10-CM

## 2018-06-10 DIAGNOSIS — M79671 Pain in right foot: Secondary | ICD-10-CM

## 2018-06-10 DIAGNOSIS — M25471 Effusion, right ankle: Secondary | ICD-10-CM

## 2018-06-10 DIAGNOSIS — M7989 Other specified soft tissue disorders: Secondary | ICD-10-CM | POA: Insufficient documentation

## 2018-06-10 NOTE — Progress Notes (Signed)
   CC: right ankle pain  HPI:  Alyssa Hall is a 32 y.o. female with history noted below the presents to the internal medicine clinic for right ankle pain that started 3 weeks prior. Patient localizes the pain to her achilles tendon region, but not the plantar aspect of her foot.  Patient denies any trauma to her foot or ankle. 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 She has not tried anything for the pain. Bearing weight on the right foot makes the pain worse. Rest makes it better. She denies radiating pain or loss of strength in her lower extremity.  Past Medical History:  Diagnosis Date  . Anemia   . Anxiety   . Atrial tachycardia ([place])   . Depression   . Dizziness   . Fibroids   . Hypertension   . Hypothyroid   . Meniere's disease   . Thyrotoxicosis     Review of Systems:  As noted per history of present illness  Physical Exam:  Vitals:   06/10/18 1332  BP: 136/85  Pulse: 93  Temp: 98.7 F (37.1 C)  TempSrc: Oral  SpO2: 100%  Weight: 233 lb (105.7 kg)   Physical Exam  Constitutional: She is well-developed, well-nourished, and in no distress.  Cardiovascular: Normal rate, regular rhythm and normal heart sounds. Exam reveals no gallop and no friction rub.  No murmur heard. Pulmonary/Chest: Effort normal and breath sounds normal. No respiratory distress. She has no wheezes. She has no rales.  Musculoskeletal: She exhibits no edema.  Right foot with 5/5 motor strength in dorsi and plantar flexion No point tenderness noted Patient in acute distress when bears weight on right extremity.    Skin: No erythema.     Assessment & Plan:   See encounters tab for problem based medical decision making.   Patient discussed with Dr. Rebeca Alert

## 2018-06-10 NOTE — Patient Instructions (Addendum)
Alyssa Hall,  It was a pleasure seeing you today.  Please use ice packs on your right ankle and keep it elevated.  You can use an ankle brace while you work.  You can use extra strength tylenol to help with pain.  Please come back in 2 weeks if your pain continues or worsens.

## 2018-06-17 ENCOUNTER — Telehealth: Payer: Self-pay | Admitting: Internal Medicine

## 2018-06-17 NOTE — Telephone Encounter (Signed)
Patient calling for X-Ray Results.

## 2018-06-17 NOTE — Telephone Encounter (Signed)
Discussed with patient over the phone today

## 2018-06-20 DIAGNOSIS — M79671 Pain in right foot: Secondary | ICD-10-CM | POA: Insufficient documentation

## 2018-06-20 NOTE — Assessment & Plan Note (Addendum)
Assessment: Right foot pain Likely achilles tendonitis.  Patient has a history of plantar fasciitis but states that this is different from her plantar fasciitis pain. I suspect that her job aggravates her chronic condition. Due to distress when walking on her right foot will obtain x-ray. Recommended using ice at night, keeping her leg elevated and using an ankle brace when at work.  Plan -Right foot x-ray - ankle brace -NSAIDs  Addendum: Right x-ray without any acute abnormalities. Results were discussed with patient over the phone. She reports that she is using the ankle brace with some benefit however still having pain. Offered referral to sports medicine and patient was agreeable.

## 2018-06-21 ENCOUNTER — Telehealth: Payer: Self-pay | Admitting: *Deleted

## 2018-06-21 NOTE — Telephone Encounter (Signed)
LEFT VOICE MESSAGE FOR PATIENT TO RETURN CALL TO  [facility] REGARDING HER SPORT MEDICINE REFERRAL/ SELF PAY -IS SHE WILLING TO PAY OUT OF  POCKET FOR THIS REFERRAL .

## 2018-06-24 ENCOUNTER — Ambulatory Visit (INDEPENDENT_AMBULATORY_CARE_PROVIDER_SITE_OTHER): Payer: Self-pay | Admitting: Sports Medicine

## 2018-06-24 ENCOUNTER — Encounter: Payer: Self-pay | Admitting: Sports Medicine

## 2018-06-24 VITALS — BP 124/89 | Ht 64.0 in | Wt 233.0 lb

## 2018-06-24 DIAGNOSIS — M7661 Achilles tendinitis, right leg: Secondary | ICD-10-CM

## 2018-06-24 NOTE — Patient Instructions (Signed)
You have Achilles tendinitis. We will put in a short walking boot.  Wear this anytime your up and walking around the next 3 weeks.  After this you can gradually transition to a supportive sneaker.  Avoid wearing flat shoes or sandals. You may use ibuprofen 600-800 mg 3 times daily OR Aleve 2 tablets twice daily with food as needed for pain Ice 15 minutes 3-4 times a day as needed Do gentle range of motion exercises for the next 3 weeks.  Following this you may begin eccentric exercises. Follow-up 6 weeks

## 2018-06-24 NOTE — Progress Notes (Signed)
  Alyssa Hall - 32 y.o. female MRN [id number]  Date of birth: 07-Mar-1986    SUBJECTIVE:      Chief Complaint:/ HPI:  32 year old female presents with posterior right ankle pain for 5 weeks.  Patient reports 8/10 pain with walking.  She denies any specific injury.  The pain was gradual onset.  She is not taking any medications or used ice for pain.  She has been wearing in over-the-counter ankle brace with benefit.  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   ROS:     See HPI  PERTINENT  PMH / PSH FH / / SH:  Past Medical, Surgical, Social, and Family History Reviewed & Updated in the EMR.    OBJECTIVE: BP 124/89   Ht 5\' 4"  (1.626 m)   Wt 233 lb (105.7 kg)   LMP 06/07/2018 (Exact Date)   BMI 39.99 kg/m   Physical Exam:  Vital signs are reviewed.  GEN: Alert and oriented, NAD Pulm: Breathing unlabored PSY: normal mood, congruent affect  MSK: Right ankle: - Inspection: No obvious deformity, erythema, swelling, or ecchymosis - Palpation: Tenderness over the Achilles near its insertion on the calcaneus.  No bony tenderness.  No tenderness over the distal attachment of the peroneal tendons or posterior tibialis - Strength: Normal strength with dorsiflexion, plantarflexion, inversion, and eversion.  Pain with resisted plantarflexion.  Pain with heel raise - ROM: Full ROM without pain - Neuro/vasc: NV intact - Special Tests:   Negative syndesmotic compression.  Negative Thompson's  Left ankle: - Inspection: No obvious deformity, erythema, swelling, or ecchymosis - Palpation: No TTP  - Strength: Normal strength with dorsiflexion, plantarflexion, inversion, and eversion - ROM: Full ROM - Neuro/vasc: NV intact - Special Tests: Negative Thompson's    ASSESSMENT & PLAN:  1.  Right Achilles tendinitis-no evidence of Achilles tendon rupture on exam. -Patient placed in cam walker.  She will wear this for the next 3 weeks and transition to a sneaker -  Patient will begin range of motion exercises followed by eccentric strengthening after she is transition to a sneaker -Discussed nitroglycerin however patient declined due to risk of headaches -Follow-up 6 weeks

## 2018-06-25 NOTE — Progress Notes (Signed)
Internal Medicine Clinic Attending  Case discussed with Dr. Hoffman at the time of the visit.  We reviewed the resident's history and exam and pertinent patient test results.  I agree with the assessment, diagnosis, and plan of care documented in the resident's note.  Alexander Raines, M.D., Ph.D.  

## 2018-07-18 ENCOUNTER — Other Ambulatory Visit: Payer: Self-pay | Admitting: Internal Medicine

## 2018-08-04 ENCOUNTER — Encounter: Payer: Self-pay | Admitting: Family Medicine

## 2018-08-04 ENCOUNTER — Ambulatory Visit (INDEPENDENT_AMBULATORY_CARE_PROVIDER_SITE_OTHER): Payer: Self-pay | Admitting: Family Medicine

## 2018-08-04 VITALS — BP 137/94 | Ht 64.0 in | Wt 230.0 lb

## 2018-08-04 DIAGNOSIS — M7751 Other enthesopathy of right foot: Secondary | ICD-10-CM

## 2018-08-04 DIAGNOSIS — M7661 Achilles tendinitis, right leg: Secondary | ICD-10-CM

## 2018-08-04 NOTE — Patient Instructions (Signed)
You have Achilles Tendinopathy Aleve 2 tabs twice a day with food for pain and inflammation if you choose to take an anti-inflammatory (can increase your blood pressure but this is the safest one). Start physical therapy and do home exercises on days you don't go to therapy. Ice bucket 10-15 minutes at end of day - can ice 3-4 times a day. Avoid uneven ground, hills as much as possible. Heel lifts in shoes or shoes with a natural heel lift. Consider casting (someone would have to be here with you to drive you), nitro patches. Follow up in 6 weeks.

## 2018-08-04 NOTE — Progress Notes (Signed)
  Alyssa Hall - 32 y.o. female MRN [id number]  Date of birth: Aug 28, 1986    SUBJECTIVE:      Chief Complaint:/ HPI:   57/5: 32 year old female presents with posterior right ankle pain for 5 weeks.  Patient reports 8/10 pain with walking.  She denies any specific injury.  The pain was gradual onset.  She is not taking any medications or used ice for pain.  She has been wearing in over-the-counter ankle brace with benefit.  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  11/27: Patient returns reporting she hasn't improved since last visit. Pain still posterior right heel, sharp, difficulty walking. Tried boot with heel lift for short period but states did not feel better with this and since stopped. Only doing motion exercises, no strengthening. No skin changes, numbness but she has localized burning sensation at heel posteriorly.  ROS:     See HPI  PERTINENT  PMH / PSH FH / / SH:  Past Medical, Surgical, Social, and Family History Reviewed & Updated in the EMR.    OBJECTIVE: BP (!) 137/94   Ht 5\' 4"  (1.626 m)   Wt 230 lb (104.3 kg)   BMI 39.48 kg/m   Physical Exam:  Vital signs are reviewed.  Gen: NAD, comfortable in exam room  Right foot/ankle: No gross deformity, swelling, ecchymoses FROM with pain on full dorsiflexion.  5/5 strength all directions but cannot do heel raise. TTP at insertion of achilles on calcaneus.  No other tenderness of foot or ankle. Negative ant drawer and talar tilt.   Negative syndesmotic compression. Negative calcaneal squeeze. Thompsons test negative. NV intact distally.  MSK u/s right heel:  Achilles tendon thickened at 0.80cm with some neovascularity at insertion.  Retrocalcaneal bursitis noted.  No evidence partial tear.  ASSESSMENT & PLAN: 1. Right achilles tendinopathy - patient continues to struggle with pain - tendinous changes on ultrasound along with retrocalcaneal bursitis.  Discussed options - she will start  physical therapy.  She declined nitro patches - afraid of headaches with this.  She declined NSAID with history of hypertension - encouraged aleve if she takes one.  Icing, avoid uneven ground and hills.  Heel lifts.  Consider casting in plantarflexion for short period if she continues to struggle.  F/u in 6 weeks.  We discussed risk of achilles rupture with injection into retrocalcaneal bursa - I advised against this.

## 2018-08-25 ENCOUNTER — Ambulatory Visit: Payer: Self-pay

## 2018-08-25 ENCOUNTER — Encounter: Payer: Self-pay | Admitting: Internal Medicine

## 2018-09-14 ENCOUNTER — Ambulatory Visit: Payer: Self-pay | Admitting: Family Medicine

## 2018-09-20 ENCOUNTER — Ambulatory Visit: Payer: Self-pay

## 2018-09-22 ENCOUNTER — Ambulatory Visit: Payer: Self-pay | Admitting: Internal Medicine

## 2018-09-22 ENCOUNTER — Encounter: Payer: Self-pay | Admitting: Internal Medicine

## 2018-09-22 ENCOUNTER — Other Ambulatory Visit: Payer: Self-pay

## 2018-09-22 VITALS — BP 145/96 | HR 106 | Temp 97.8°F | Ht 64.0 in | Wt 233.9 lb

## 2018-09-22 DIAGNOSIS — Z79899 Other long term (current) drug therapy: Secondary | ICD-10-CM

## 2018-09-22 DIAGNOSIS — H814 Vertigo of central origin: Secondary | ICD-10-CM

## 2018-09-22 DIAGNOSIS — E039 Hypothyroidism, unspecified: Secondary | ICD-10-CM

## 2018-09-22 DIAGNOSIS — I10 Essential (primary) hypertension: Secondary | ICD-10-CM

## 2018-09-22 LAB — POCT GLYCOSYLATED HEMOGLOBIN (HGB A1C): Hemoglobin A1C: 5.1 % (ref 4.0–5.6)

## 2018-09-22 LAB — GLUCOSE, CAPILLARY: Glucose-Capillary: 93 mg/dL (ref 70–99)

## 2018-09-22 MED ORDER — LEVOTHYROXINE SODIUM 75 MCG PO TABS: 75.0000 ug | ORAL_TABLET | Freq: Every day | ORAL | 5 refills | Status: DC

## 2018-09-22 MED ORDER — LABETALOL HCL 100 MG PO TABS: 100.0000 mg | ORAL_TABLET | Freq: Two times a day (BID) | ORAL | 1 refills | Status: DC

## 2018-09-22 MED ORDER — HYDROCHLOROTHIAZIDE 12.5 MG PO TABS: 12.5000 mg | ORAL_TABLET | Freq: Every day | ORAL | 2 refills | Status: DC

## 2018-09-22 NOTE — Patient Instructions (Signed)
FOLLOW-UP INSTRUCTIONS When: In 2 weeks for a follow-up visit What to bring: All of your medications   I have added low dose HCTZ to your medications today. Please take one tablet daily.  Today we discussed your dizziness. I feel that there are several potential causes of your illness but given the length of time that they have persisted I would like to order an MRI. Please stop at the front desk and request an appointment with the financial counselor. They will call you to schedule the MRI.   I will notify you of the results of any labs from today's evaluation when available to me.   Thank you for your visit to the [facility] today. If you have any questions or concerns please call us at [phone].

## 2018-09-22 NOTE — Progress Notes (Signed)
   CC: "feeling dizzy still"  HPI:[name] is a 33 y.o. female who presents for evaluation of dizziness. Please see individual problem based A/P for details.  PHQ-9: Based on the patients    Office Visit from 09/22/2018 in [place]  PHQ-9 Total Score  8     score we have decided to evaluate her underlying conditions and reevaluate.  Past Medical History:  Diagnosis Date  . Anemia   . Anxiety   . Atrial tachycardia ([place])   . Depression   . Dizziness   . Fibroids   . Hypertension   . Hypothyroid   . Meniere's disease   . Thyrotoxicosis    Review of Systems:  ROS negative except as per HPI.  Physical Exam: Vitals:   09/22/18 1347  BP: (!) 145/96  Pulse: (!) 106  Temp: 97.8 F (36.6 C)  TempSrc: Oral  SpO2: 100%  Weight: 233 lb 14.4 oz (106.1 kg)  Height: 5\' 4"  (1.626 m)   General: A/O x4, in no acute distress, afebrile, nondiaphoretic HEENT: PERRL, EOM intact, no nystagmus noted on exam but limited due to patient discomfort with performing eye movements.  Neuro: CN II-XII grossly intact, strength 5/5 upper and lower, mild gait instability upon review Cardio: Rate slightly increased, rhythm regular, no mrg's Pulmonary: CTA bilaterally   Assessment & Plan:   See Encounters Tab for problem based charting.  Patient discussed with Dr. Lynnae Hall

## 2018-09-23 DIAGNOSIS — R42 Dizziness and giddiness: Secondary | ICD-10-CM | POA: Insufficient documentation

## 2018-09-23 LAB — BMP8+ANION GAP
Anion Gap: 18 mmol/L (ref 10.0–18.0)
BUN/Creatinine Ratio: 14 (ref 9–23)
BUN: 11 mg/dL (ref 6–20)
CO2: 21 mmol/L (ref 20–29)
Calcium: 10 mg/dL (ref 8.7–10.2)
Chloride: 101 mmol/L (ref 96–106)
Creatinine, Ser: 0.76 mg/dL (ref 0.57–1.00)
GFR calc Af Amer: 121 mL/min/{1.73_m2} (ref >59)
GFR calc non Af Amer: 105 mL/min/{1.73_m2} (ref >59)
Glucose: 83 mg/dL (ref 65–99)
Potassium: 4.4 mmol/L (ref 3.5–5.2)
Sodium: 140 mmol/L (ref 134–144)

## 2018-09-23 LAB — TSH: TSH: 1.99 u[IU]/mL (ref 0.450–4.500)

## 2018-09-23 NOTE — Assessment & Plan Note (Signed)
  Vertigo: >7 months of room spinning sensation worse with head turning. Improved with rest improving overnight and progressively worsens throughout the day. It is not relieved with any particular action, medication or treatment (although HCTZ appeared to improve the sensation in the past). She endorses the previously diagnosed Meniere's diease symptoms are similar to her current symptoms but had improved for a time. They are are now worsening again and more difficult to control. She denied syncope, but endorses rapid heart beat with some but not all episodes.  HCTZ was recommended to be discontinued at some point in the past due to concerns for inducing hypotension, although she did not experience such on her prior treatment Multiple possible etiologies including her cardiac arrhythmia, supratheraputic levothyroxine, new onset diabetes in conjunction with the afore mentioned concerns, central intracerebral process.  Plan: We will order an MRI as there is notable concern for a central process given the length of time she has experienced symptoms. However, meanwhile, we will recommend HCTZ given her HTN and symptoms with the prior improvement on this treatment.  I was also concerned for a metabolic process vs cardiogenic given her history and will obtain a TSH, A1c, and BMP- These have returned and are WNL's increasing the need for the MRI. Her symptoms do not well correlate with tachycardia but her history of arrhythmia does in crease her risk for CVA. Will follow MRI.

## 2018-09-23 NOTE — Assessment & Plan Note (Addendum)
BP elevated to 145/96 today. We will initiate HCTZ and continue labetolol 100mg  BID.  Return on 10/04/2018 for BP check and follow-up  BMP unremarkable. BMP Latest Ref Rng & Units 09/22/2018 04/12/2018 03/30/2018  Glucose 65 - 99 mg/dL 83 114(H) 76  BUN 6 - 20 mg/dL 11 7 11   Creatinine 0.57 - 1.00 mg/dL 0.76 0.84 0.79  BUN/Creat Ratio 9 - 23 14 - 14  Sodium 134 - 144 mmol/L 140 139 140  Potassium 3.5 - 5.2 mmol/L 4.4 4.2 4.2  Chloride 96 - 106 mmol/L 101 105 100  CO2 20 - 29 mmol/L 21 27 23   Calcium 8.7 - 10.2 mg/dL 10.0 10.0 10.2

## 2018-09-23 NOTE — Assessment & Plan Note (Signed)
TSH stable, symptoms unlikely to be related. No medication changes indicated.

## 2018-09-24 NOTE — Progress Notes (Signed)
Internal Medicine Clinic Attending  Case discussed with Dr. Harbrecht at the time of the visit.  We reviewed the resident's history and exam and pertinent patient test results.  I agree with the assessment, diagnosis, and plan of care documented in the resident's note.   

## 2018-10-04 ENCOUNTER — Ambulatory Visit: Payer: Self-pay

## 2018-10-05 ENCOUNTER — Ambulatory Visit: Payer: Self-pay | Admitting: Internal Medicine

## 2018-10-05 ENCOUNTER — Encounter: Payer: Self-pay | Admitting: Internal Medicine

## 2018-10-05 VITALS — BP 130/91 | HR 101 | Wt 231.1 lb

## 2018-10-05 DIAGNOSIS — N979 Female infertility, unspecified: Secondary | ICD-10-CM

## 2018-10-05 DIAGNOSIS — D259 Leiomyoma of uterus, unspecified: Secondary | ICD-10-CM

## 2018-10-05 NOTE — Progress Notes (Signed)
Pt given Free Pap Screening info [facility] Referral Form faxed with office notes via front office. Pt given info to contact [facility] if she has not heard from them within two weeks.

## 2018-10-06 ENCOUNTER — Encounter: Payer: Self-pay | Admitting: Internal Medicine

## 2018-10-06 ENCOUNTER — Ambulatory Visit: Payer: Self-pay

## 2018-10-11 ENCOUNTER — Telehealth: Payer: Self-pay

## 2018-10-11 NOTE — Progress Notes (Signed)
   Subjective:    Alyssa Hall - 33 y.o. female MRN [id number]  Date of birth: Jun 22, 1986  HPI  Alyssa Hall is a 33 y.o. [id number] female here for concerns about irregular bleeding and infertility. Reports she has a known history of uterine fibroids. She has been having more frequent unpredictable episodes of bleeding. Also endorsing some persistent lower abdominal cramping associated with the episodes of bleeding. Denies vaginal discharge, pelvic pain, fevers, dysuria, vomiting. Not using any type of hormonal contraception and does not wish to use any. Has been trying to get pregnant for two years.     OB History    Gravida  1   Para      Term      Preterm      AB  1   Living        SAB      TAB  1   Ectopic      Multiple      Live Births               -  reports that she has never smoked. She has never used smokeless tobacco. - Review of Systems: Per HPI. - Past Medical History: Patient Active Problem List   Diagnosis Date Noted  . Vertigo of central origin 09/23/2018  . Pain of right heel 06/20/2018  . Foot pain, right 06/20/2018  . Chest tightness 05/01/2018  . Morbid obesity ([place]) 03/29/2018  . Prenatal care 12/26/2017  . Amenorrhea 12/24/2017  . Migraine with aura and without status migrainosus, not intractable 05/17/2017  . Plantar fasciitis, right 05/17/2017  . Chronic low back pain 04/02/2017  . Muscle strain 03/28/2015  . Atrial ectopic tachycardia ([place]) 12/06/2014  . Essential hypertension 10/11/2013  . Healthcare maintenance 05/27/2013  . Meniere's disease 07/12/2012  . Dysmenorrhea 05/05/2012  . Iron deficiency anemia 02/04/2012  . Hypothyroidism (acquired) 01/14/2012  . Generalized anxiety disorder 01/14/2012   - Medications: reviewed and updated   Objective:   Physical Exam BP (!) 130/91   Pulse (!) 101   Wt 231 lb 1.6 oz (104.8 kg)   LMP 09/18/2018 (Exact Date)   BMI 39.67 kg/m  Gen: NAD, alert, cooperative with exam,  well-appearing GU/GYN: Exam performed in the presence of a chaperone. External genitalia within normal limits.  Vaginal mucosa pink, moist, normal rugae.  Nonfriable cervix without lesions, no discharge or bleeding noted on speculum exam.  Bimanual exam revealed enlarged uterus likely secondary to fibroids.  No cervical motion tenderness. No adnexal masses bilaterally.        Assessment & Plan:   1. Female infertility - Ambulatory referral to Infertility  2. Uterine leiomyoma, unspecified location Sono in 2018 showed enlarged myomatous fibroid with three fibroids, one of which increased in size since 2016 (other two were not previously noted on imaging) with a distorted endometrium. Will order follow up imaging.  - US PELVIC COMPLETE WITH TRANSVAGINAL; Future   Routine preventative health maintenance measures emphasized. Please refer to After Visit Summary for other counseling recommendations.   No follow-ups on file.  Phill Myron, D.O. OB Fellow  10/11/2018, 3:54 PM

## 2018-10-11 NOTE — Telephone Encounter (Signed)
Pt states she do not want the MRI Brain W/WO contrast to be scheduled at this time, pt do not have insurance.

## 2018-10-13 ENCOUNTER — Ambulatory Visit: Payer: Self-pay

## 2018-10-13 ENCOUNTER — Encounter: Payer: Self-pay | Admitting: Internal Medicine

## 2018-10-18 ENCOUNTER — Ambulatory Visit: Payer: Self-pay

## 2018-10-20 ENCOUNTER — Ambulatory Visit: Payer: Self-pay

## 2018-10-25 ENCOUNTER — Emergency Department (HOSPITAL_COMMUNITY)
Admission: EM | Admit: 2018-10-25 | Discharge: 2018-10-25 | Disposition: A | Discharge status: Home / Self Care | Payer: Self-pay | Attending: Emergency Medicine | Admitting: Emergency Medicine

## 2018-10-25 ENCOUNTER — Other Ambulatory Visit: Payer: Self-pay

## 2018-10-25 ENCOUNTER — Emergency Department (HOSPITAL_COMMUNITY): Payer: Self-pay

## 2018-10-25 ENCOUNTER — Encounter (HOSPITAL_COMMUNITY): Payer: Self-pay | Admitting: Oncology

## 2018-10-25 DIAGNOSIS — D259 Leiomyoma of uterus, unspecified: Secondary | ICD-10-CM | POA: Insufficient documentation

## 2018-10-25 DIAGNOSIS — R109 Unspecified abdominal pain: Secondary | ICD-10-CM

## 2018-10-25 DIAGNOSIS — R300 Dysuria: Secondary | ICD-10-CM | POA: Insufficient documentation

## 2018-10-25 DIAGNOSIS — I10 Essential (primary) hypertension: Secondary | ICD-10-CM | POA: Insufficient documentation

## 2018-10-25 DIAGNOSIS — E039 Hypothyroidism, unspecified: Secondary | ICD-10-CM | POA: Insufficient documentation

## 2018-10-25 DIAGNOSIS — R110 Nausea: Secondary | ICD-10-CM | POA: Insufficient documentation

## 2018-10-25 DIAGNOSIS — Z79899 Other long term (current) drug therapy: Secondary | ICD-10-CM | POA: Insufficient documentation

## 2018-10-25 DIAGNOSIS — R1033 Periumbilical pain: Secondary | ICD-10-CM | POA: Insufficient documentation

## 2018-10-25 LAB — COMPREHENSIVE METABOLIC PANEL
ALBUMIN: 3.9 g/dL (ref 3.5–5.0)
ALT: 18 U/L (ref 0–44)
ANION GAP: 12 (ref 5–15)
AST: 21 U/L (ref 15–41)
Alkaline Phosphatase: 52 U/L (ref 38–126)
BILIRUBIN TOTAL: 0.7 mg/dL (ref 0.3–1.2)
BUN: 9 mg/dL (ref 6–20)
CO2: 25 mmol/L (ref 22–32)
Calcium: 9.8 mg/dL (ref 8.9–10.3)
Chloride: 101 mmol/L (ref 98–111)
Creatinine, Ser: 0.78 mg/dL (ref 0.44–1.00)
Glucose, Bld: 96 mg/dL (ref 70–99)
POTASSIUM: 3.8 mmol/L (ref 3.5–5.1)
Sodium: 138 mmol/L (ref 135–145)
Total Protein: 7.7 g/dL (ref 6.5–8.1)

## 2018-10-25 LAB — URINALYSIS, ROUTINE W REFLEX MICROSCOPIC
Bacteria, UA: NONE SEEN
Bilirubin Urine: NEGATIVE
GLUCOSE, UA: NEGATIVE mg/dL
Ketones, ur: NEGATIVE mg/dL
Leukocytes,Ua: NEGATIVE
NITRITE: NEGATIVE
PH: 6 (ref 5.0–8.0)
PROTEIN: NEGATIVE mg/dL
Specific Gravity, Urine: 1.009 (ref 1.005–1.030)

## 2018-10-25 LAB — CBC
HCT: 38.3 % (ref 36.0–46.0)
HEMOGLOBIN: 11.1 g/dL — AB (ref 12.0–15.0)
MCH: 21.8 pg — ABNORMAL LOW (ref 26.0–34.0)
MCHC: 29 g/dL — ABNORMAL LOW (ref 30.0–36.0)
MCV: 75.1 fL — ABNORMAL LOW (ref 80.0–100.0)
NRBC: 0 % (ref 0.0–0.2)
Platelets: 390 10*3/uL (ref 150–400)
RBC: 5.1 MIL/uL (ref 3.87–5.11)
RDW: 16 % — ABNORMAL HIGH (ref 11.5–15.5)
WBC: 5.9 10*3/uL (ref 4.0–10.5)

## 2018-10-25 LAB — WET PREP, GENITAL
Clue Cells Wet Prep HPF POC: NONE SEEN
Sperm: NONE SEEN
TRICH WET PREP: NONE SEEN
YEAST WET PREP: NONE SEEN

## 2018-10-25 LAB — I-STAT BETA HCG BLOOD, ED (MC, WL, AP ONLY): I-stat hCG, quantitative: <5 m[IU]/mL (ref <5)

## 2018-10-25 LAB — LIPASE, BLOOD: Lipase: 24 U/L (ref 11–51)

## 2018-10-25 MED ORDER — MORPHINE SULFATE (PF) 4 MG/ML IV SOLN
4.0000 mg | Freq: Once | INTRAVENOUS | Status: AC
  Administered 2018-10-25: 4 mg via INTRAVENOUS
  Filled 2018-10-25: qty 1

## 2018-10-25 MED ORDER — NAPROXEN 500 MG PO TABS: 500.0000 mg | ORAL_TABLET | Freq: Two times a day (BID) | ORAL | 0 refills | Status: DC

## 2018-10-25 MED ORDER — SODIUM CHLORIDE 0.9% FLUSH
3.0000 mL | Freq: Once | INTRAVENOUS | Status: AC
  Administered 2018-10-25: 3 mL via INTRAVENOUS

## 2018-10-25 NOTE — ED Notes (Signed)
Pt returns from us

## 2018-10-25 NOTE — ED Provider Notes (Signed)
[facility] EMERGENCY DEPARTMENT Provider Note   CSN: [id number] Arrival date & time: 10/25/18  2993     History   Chief Complaint Chief Complaint  Patient presents with  . Abdominal Pain    HPI Alyssa Hall is a 33 y.o. female with a past medical history of hypertension, anxiety, fibroids who presents to ED for 4-day history of sharp, constant suprapubic pain.  She has had menstrual bleeding since 10/18/2018, and continues to have light spotting.  She states that this is typical for her and sometimes can go greater than 7 days with bleeding.  She noticed prior to her menstrual cycle, she had discharge with a foul odor.  She has tried Tylenol with only mild improvement in her pain.  She has had history of similar pain with her menstrual cycle although "it does not really last this long."  She notes some dysuria and nausea.  Denies any vomiting, changes to bowel movements, birth control use, concern for STDs, prior abdominal surgeries.  HPI  Past Medical History:  Diagnosis Date  . Anemia   . Anxiety   . Atrial tachycardia ([place])   . Depression   . Dizziness   . Fibroids   . Hypertension   . Hypothyroid   . Meniere's disease   . Thyrotoxicosis     Patient Active Problem List   Diagnosis Date Noted  . Vertigo of central origin 09/23/2018  . Pain of right heel 06/20/2018  . Foot pain, right 06/20/2018  . Chest tightness 05/01/2018  . Morbid obesity ([place]) 03/29/2018  . Prenatal care 12/26/2017  . Amenorrhea 12/24/2017  . Migraine with aura and without status migrainosus, not intractable 05/17/2017  . Plantar fasciitis, right 05/17/2017  . Chronic low back pain 04/02/2017  . Muscle strain 03/28/2015  . Atrial ectopic tachycardia ([place]) 12/06/2014  . Essential hypertension 10/11/2013  . Healthcare maintenance 05/27/2013  . Meniere's disease 07/12/2012  . Dysmenorrhea 05/05/2012  . Iron deficiency anemia 02/04/2012  . Hypothyroidism (acquired)  01/14/2012  . Generalized anxiety disorder 01/14/2012    History reviewed. No pertinent surgical history.   OB History    Gravida  1   Para      Term      Preterm      AB  1   Living        SAB      TAB  1   Ectopic      Multiple      Live Births               Home Medications    Prior to Admission medications   Medication Sig Start Date End Date Taking? Authorizing Provider  acetaminophen (TYLENOL) 325 MG tablet Take 650 mg by mouth every 6 (six) hours as needed for mild pain.     [provider]  ferrous sulfate 325 (65 FE) MG tablet Take 1 tablet (325 mg total) by mouth at bedtime. 10/02/17   Drenda Freeze, MD  hydrochlorothiazide (HYDRODIURIL) 12.5 MG tablet Take 1 tablet (12.5 mg total) by mouth daily. 09/22/18   Kathi Ludwig, MD  labetalol (NORMODYNE) 100 MG tablet Take 1 tablet (100 mg total) by mouth 2 (two) times daily. 09/22/18   Kathi Ludwig, MD  levothyroxine (SYNTHROID) 75 MCG tablet Take 1 tablet (75 mcg total) by mouth daily before breakfast. 09/22/18 03/21/19  Kathi Ludwig, MD  naproxen (NAPROSYN) 500 MG tablet Take 1 tablet (500 mg total) by mouth 2 (two) times  daily. 10/25/18   Delia Heady, PA-C  prenatal vitamin w/FE, FA (NATACHEW) 29-1 MG CHEW chewable tablet Chew 1 tablet by mouth daily at 12 noon. 03/30/18   Katherine Roan, MD    Family History Family History  Problem Relation Age of Onset  . Heart failure Maternal Aunt     Social History Social History   Tobacco Use  . Smoking status: Never Smoker  . Smokeless tobacco: Never Used  Substance Use Topics  . Alcohol use: No    Alcohol/week: 0.0 standard drinks  . Drug use: No     Allergies   Patient has no known allergies.   Review of Systems Review of Systems  Constitutional: Negative for appetite change, chills and fever.  HENT: Negative for ear pain, rhinorrhea, sneezing and sore throat.   Eyes: Negative for photophobia and visual  disturbance.  Respiratory: Negative for cough, chest tightness, shortness of breath and wheezing.   Cardiovascular: Negative for chest pain and palpitations.  Gastrointestinal: Positive for abdominal pain and nausea. Negative for blood in stool, constipation, diarrhea and vomiting.  Genitourinary: Positive for vaginal bleeding and vaginal discharge. Negative for dysuria, hematuria and urgency.  Musculoskeletal: Negative for myalgias.  Skin: Negative for rash.  Neurological: Negative for dizziness, weakness and light-headedness.     Physical Exam Updated Vital Signs BP (!) 120/50 (BP Location: Right Arm)   Pulse 80   Temp 97.9 F (36.6 C) (Oral)   Resp 20   Ht 5\' 4"  (1.626 m)   Wt 105.7 kg   LMP 10/18/2018 (Approximate)   SpO2 97%   BMI 39.99 kg/m   Physical Exam Vitals signs and nursing note reviewed. Exam conducted with a chaperone present.  Constitutional:      General: She is not in acute distress.    Appearance: She is well-developed.  HENT:     Head: Normocephalic and atraumatic.     Nose: Nose normal.  Eyes:     General: No scleral icterus.       Left eye: No discharge.     Conjunctiva/sclera: Conjunctivae normal.  Neck:     Musculoskeletal: Normal range of motion and neck supple.  Cardiovascular:     Rate and Rhythm: Normal rate and regular rhythm.     Heart sounds: Normal heart sounds. No murmur. No friction rub. No gallop.   Pulmonary:     Effort: Pulmonary effort is normal. No respiratory distress.     Breath sounds: Normal breath sounds.  Abdominal:     General: Bowel sounds are normal. There is no distension.     Palpations: Abdomen is soft.     Tenderness: There is abdominal tenderness in the suprapubic area. There is no guarding.  Genitourinary:    Cervix: No cervical motion tenderness.     Uterus: Tender.      Adnexa:        Right: Tenderness present.        Left: Tenderness present.      Comments: Pelvic exam: normal external genitalia without  evidence of trauma. VULVA: normal appearing vulva with no masses, tenderness or lesion. VAGINA: normal appearing vagina with normal color and discharge, no lesions. CERVIX: normal appearing cervix without lesions, cervical motion tenderness absent, cervical os closed with out purulent discharge; No vaginal discharge. Wet prep and DNA probe for chlamydia and GC obtained.   ADNEXA: normal adnexa in size, no masses. UTERUS: uterus is normal size, shape, consistency. Diffuse TTP of lower abdomen. Musculoskeletal: Normal range of  motion.  Skin:    General: Skin is warm and dry.     Findings: No rash.  Neurological:     Mental Status: She is alert.     Motor: No abnormal muscle tone.     Coordination: Coordination normal.      ED Treatments / Results  Labs (all labs ordered are listed, but only abnormal results are displayed) Labs Reviewed  WET PREP, GENITAL - Abnormal; Notable for the following components:      Result Value   WBC, Wet Prep HPF POC MODERATE (*)    All other components within normal limits  CBC - Abnormal; Notable for the following components:   Hemoglobin 11.1 (*)    MCV 75.1 (*)    MCH 21.8 (*)    MCHC 29.0 (*)    RDW 16.0 (*)    All other components within normal limits  URINALYSIS, ROUTINE W REFLEX MICROSCOPIC - Abnormal; Notable for the following components:   Color, Urine STRAW (*)    Hgb urine dipstick MODERATE (*)    All other components within normal limits  LIPASE, BLOOD  COMPREHENSIVE METABOLIC PANEL  I-STAT BETA HCG BLOOD, ED ([facility], [facility], [facility] ONLY)  GC/CHLAMYDIA PROBE AMP ([place]) NOT AT [facility]    EKG None  Radiology US Pelvic Complete W Transvaginal And Torsion R/o  Result Date: 10/25/2018 CLINICAL DATA:  Acute abdominal pain. EXAM: TRANSABDOMINAL AND TRANSVAGINAL ULTRASOUND OF PELVIS DOPPLER ULTRASOUND OF OVARIES TECHNIQUE: Both transabdominal and transvaginal ultrasound examinations of the pelvis were performed. Transabdominal technique was  performed for global imaging of the pelvis including uterus, ovaries, adnexal regions, and pelvic cul-de-sac. It was necessary to proceed with endovaginal exam following the transabdominal exam to visualize the endometrium and ovaries. Color and duplex Doppler ultrasound was utilized to evaluate blood flow to the ovaries. COMPARISON:  Ultrasound of June 11, 2017. FINDINGS: Uterus Measurements: 16.9 x 9.2 x 8.1 cm = volume: 663 mL. Probable calcified fibroid is noted posteriorly measuring 2.6 cm in maximum diameter. Endometrium Thickness: 27 mm which is abnormally thickened. No focal abnormality visualized. Right ovary Measurements: 4.0 x 2.9 x 2.5 cm = volume: 15 mL. 2 cm follicular cyst is noted. Left ovary Not visualized. Pulsed Doppler evaluation of right ovary demonstrates normal low-resistance arterial and venous waveforms. Other findings No abnormal free fluid. IMPRESSION: Left ovary not visualized. Probable 2.6 cm calcified uterine fibroid. Endometrium is significantly thickened at 27 mm; this is considered abnormal. Consider follow-up by [place] in 6-8 weeks, during the week immediately following menses (exam timing is critical). Electronically Signed   By: Marijo Conception, M.D.   On: 10/25/2018 09:22    Procedures Procedures (including critical care time)  Medications Ordered in ED Medications  sodium chloride flush (NS) 0.9 % injection 3 mL (3 mLs Intravenous Given 10/25/18 0929)  morphine 4 MG/ML injection 4 mg (4 mg Intravenous Given 10/25/18 1884)     Initial Impression / Assessment and Plan / ED Course  I have reviewed the triage vital signs and the nursing notes.  Pertinent labs & imaging results that were available during my care of the patient were reviewed by me and considered in my medical decision making (see chart for details).     33 year old female presents to ED for 4-day history of sharp, constant suprapubic pain.  She continues to have light spotting for the past 7 days  associated with her menstrual cycle which is typical for her.  She has had mild improvement with  Tylenol.  Denies any changes to bowel movements, concern for STDs.  She has had vaginal discharge with odor since before her cycle began.  Pelvic exam revealed diffuse tenderness palpation of the lower abdomen, no cervical motion tenderness.  Some discharge noted but no bleeding noted.  Lab work shows hemoglobin of 11.1 which is similar to her baseline.  Urinalysis unremarkable.  CMP, lipase, wet prep unremarkable.  hCG is negative.  Pelvic ultrasound shows calcified uterine fibroid, thickened endometrial stripe.  They recommend repeat ultrasound.  Patient's pain controlled here.  We will have her follow-up with her PCP/OB/GYN for repeat ultrasound and give NSAIDs in the meantime to help with pain.  Advised to return to ED for any severe worsening symptoms.  Patient is hemodynamically stable, in NAD, and able to ambulate in the ED. Evaluation does not show pathology that would require ongoing emergent intervention or inpatient treatment. I explained the diagnosis to the patient. Pain has been managed and has no complaints prior to discharge. Patient is comfortable with above plan and is stable for discharge at this time. All questions were answered prior to disposition. Strict return precautions for returning to the ED were discussed. Encouraged follow up with PCP.    Portions of this note were generated with [employer]. Dictation errors may occur despite best attempts at proofreading.   Final Clinical Impressions(s) / ED Diagnoses   Final diagnoses:  Abdominal pain  Uterine leiomyoma, unspecified location    ED Discharge Orders         Ordered    naproxen (NAPROSYN) 500 MG tablet  2 times daily     10/25/18 0945           Delia Heady, PA-C 10/25/18 0950    Duffy Bruce, MD 10/27/18 [phone]

## 2018-10-25 NOTE — ED Triage Notes (Signed)
BIB [facility] from home with c/o of suprapubic pain X 2 days. Worsening tonight and arriving with 10/10 sharp pain. Pt also reports vaginal discharge and a recent period that was lighter than normal. Pt tearful on arrival.

## 2018-10-25 NOTE — ED Notes (Signed)
Bladder scan: 28ml

## 2018-10-25 NOTE — ED Notes (Signed)
Pt verbalized understanding of discharge instructions and denies any further questions at this time.   

## 2018-10-25 NOTE — ED Notes (Signed)
Pt walking in room. SO at bedside

## 2018-10-25 NOTE — ED Notes (Signed)
Pt to u/s

## 2018-10-25 NOTE — Discharge Instructions (Addendum)
Your ultrasound today showed the following findings: Probable 2.6 cm calcified uterine fibroid. Endometrium is significantly thickened at 27 mm; this is considered abnormal. Consider follow-up by [place] in 6-8 weeks, during the week immediately following menses (exam timing is critical). It is important for you to follow-up with your PCP and OB/GYN for the above-mentioned repeat ultrasound. Take naproxen as needed for pain. Return to the ED for worsening symptoms, increased pain, vomiting or coughing up blood, increased bleeding.

## 2018-10-26 LAB — GC/CHLAMYDIA PROBE AMP (~~LOC~~) NOT AT ARMC
Chlamydia: NEGATIVE
Neisseria Gonorrhea: NEGATIVE

## 2018-11-05 ENCOUNTER — Ambulatory Visit: Payer: Self-pay

## 2018-11-09 ENCOUNTER — Other Ambulatory Visit: Payer: Self-pay

## 2018-11-09 ENCOUNTER — Ambulatory Visit: Payer: Self-pay | Admitting: Internal Medicine

## 2018-11-09 VITALS — Ht 64.0 in | Wt 226.7 lb

## 2018-11-09 DIAGNOSIS — H9312 Tinnitus, left ear: Secondary | ICD-10-CM

## 2018-11-09 DIAGNOSIS — Z8669 Personal history of other diseases of the nervous system and sense organs: Secondary | ICD-10-CM

## 2018-11-09 DIAGNOSIS — R2689 Other abnormalities of gait and mobility: Secondary | ICD-10-CM

## 2018-11-09 DIAGNOSIS — H9192 Unspecified hearing loss, left ear: Secondary | ICD-10-CM

## 2018-11-09 DIAGNOSIS — R42 Dizziness and giddiness: Secondary | ICD-10-CM

## 2018-11-09 NOTE — Progress Notes (Signed)
   CC: vertigo  HPI:  [name] is a 33 y.o. female with history noted below presents to the acute care clinic for vertigo. Please see problem based charting for patient's chronic medical conditions.  Past Medical History:  Diagnosis Date  . Anemia   . Anxiety   . Atrial tachycardia ([place])   . Depression   . Dizziness   . Fibroids   . Hypertension   . Hypothyroid   . Meniere's disease   . Thyrotoxicosis     Review of Systems:  Review of Systems  HENT: Positive for hearing loss and tinnitus. Negative for ear discharge and ear pain.   Gastrointestinal: Positive for nausea. Negative for vomiting.  Neurological: Negative for focal weakness and weakness.       Gait instability      Physical Exam:  Vitals:   11/09/18 1435  SpO2: 100%  Weight: 226 lb 11.2 oz (102.8 kg)  Height: 5\' 4"  (1.626 m)   Physical Exam  Constitutional: She is well-developed, well-nourished, and in no distress.  Cardiovascular: Normal rate, regular rhythm and normal heart sounds. Exam reveals no gallop and no friction rub.  No murmur heard. Pulmonary/Chest: Effort normal and breath sounds normal. No respiratory distress. She has no wheezes. She has no rales.  Musculoskeletal:        General: No edema.     Comments: 5/5 motor strength in upper in lower extremities bilaterally And sensation intact  Neurological: No cranial nerve deficit.  Skin: Skin is warm and dry.     Assessment & Plan:   See encounters tab for problem based medical decision making.    Patient discussed with Dr. Dareen Piano

## 2018-11-09 NOTE — Assessment & Plan Note (Signed)
HPI: patient states she has been experiencing vertigo for the past couple months. She states it is worse when she has rapid changes in head movement.  She reports a history of Mnire's disease and says she currently has tinnitus and hearing loss in her left ear which has worsened.  She was seen on 09/22/2018 in the [facility] and an MRI brain was ordered and hydrochlorothiazide was recommended to be restarted. Unfortunately patient does not have insurance and has not been able to get the MRI brain. She has also not started hydrochlorothiazide due to being worried that her blood pressure will drop.  She did admit that hydrochlorothiazide has helped with her Mnire's disease in the past.  Assessment: Vertigo Likely her Mnire's disease. However will need to rule out central causes with MRI brain. Today obtained paperwork to get the orange card and cafa letter to help with payment of imaging.  She had orthostatics which was negative.  Patient states she has a blood pressure cuff that she could borrow and I encouraged starting hydrochlorothiazide and checking her blood pressure with the cuff if she felt lightheaded.  Plan -MRI brain once able to financially afford - encouraged starting HCTZ and returning at the end of the month for BP recheck and blood work

## 2018-11-09 NOTE — Patient Instructions (Signed)
Alyssa Hall,  It was a pleasure seeing you today. Please follow up in one month after you start your blood pressure medication.

## 2018-11-11 NOTE — Progress Notes (Signed)
Internal Medicine Clinic Attending  Case discussed with Dr. Hoffman at the time of the visit.  We reviewed the resident's history and exam and pertinent patient test results.  I agree with the assessment, diagnosis, and plan of care documented in the resident's note.  

## 2018-11-24 ENCOUNTER — Ambulatory Visit: Payer: Self-pay

## 2018-12-07 ENCOUNTER — Ambulatory Visit (INDEPENDENT_AMBULATORY_CARE_PROVIDER_SITE_OTHER): Payer: Self-pay | Admitting: Internal Medicine

## 2018-12-07 ENCOUNTER — Encounter: Payer: Self-pay | Admitting: Internal Medicine

## 2018-12-07 ENCOUNTER — Other Ambulatory Visit: Payer: Self-pay

## 2018-12-07 VITALS — BP 145/96 | HR 97 | Temp 99.1°F | Ht 64.0 in | Wt 219.2 lb

## 2018-12-07 DIAGNOSIS — D509 Iron deficiency anemia, unspecified: Secondary | ICD-10-CM

## 2018-12-07 DIAGNOSIS — R42 Dizziness and giddiness: Secondary | ICD-10-CM

## 2018-12-07 DIAGNOSIS — N920 Excessive and frequent menstruation with regular cycle: Secondary | ICD-10-CM

## 2018-12-07 DIAGNOSIS — I10 Essential (primary) hypertension: Secondary | ICD-10-CM

## 2018-12-07 DIAGNOSIS — H8109 Meniere's disease, unspecified ear: Secondary | ICD-10-CM

## 2018-12-07 DIAGNOSIS — D500 Iron deficiency anemia secondary to blood loss (chronic): Secondary | ICD-10-CM

## 2018-12-07 DIAGNOSIS — Z79899 Other long term (current) drug therapy: Secondary | ICD-10-CM

## 2018-12-07 NOTE — Assessment & Plan Note (Signed)
History of present illness: Patient states she takes labetalol daily. She states she hasn't taken her dose for today until she walked into the office.  She states she checks her blood pressure at home and usually averages systolics 971K.  Assessment: Essential hypertension Slightly elevated on exam at 145/96 but just took her medication. Readings at home have been lower. Will continue to monitor at this time.  Plan -Continue labetalol

## 2018-12-07 NOTE — Patient Instructions (Signed)
Ms. [place],  It is a pleasure seeing you today. Please call the clinic with any questions or concerns.

## 2018-12-07 NOTE — Assessment & Plan Note (Signed)
History of present illness:  Patient states she is currently taking prenatal vitamins but would like to check her iron levels today.  Assessment: Iron deficiency anemia secondary to menorrhagia  Plan -Ferritin and iron

## 2018-12-07 NOTE — Progress Notes (Signed)
   CC: follow-up of essential hypertension and Mnire's disease  HPI:  Alyssa Hall is a 33 y.o. female with history noted below that presents to the acute care clinic for follow-up on essential hypertension and Mnire's disease. Please see problem based charting for the status of patient's chronic medical conditions.  Past Medical History:  Diagnosis Date  . Anemia   . Anxiety   . Atrial tachycardia ([place])   . Depression   . Dizziness   . Fibroids   . Hypertension   . Hypothyroid   . Meniere's disease   . Thyrotoxicosis     Review of Systems:  Review of Systems  Constitutional: Negative for chills and fever.  Respiratory: Negative for shortness of breath.   Cardiovascular: Negative for chest pain.  Neurological: Negative for dizziness and headaches.     Physical Exam:  Vitals:   12/07/18 1441  BP: (!) 145/96  Pulse: 97  Temp: 99.1 F (37.3 C)  TempSrc: Oral  SpO2: 100%  Weight: 219 lb 3.2 oz (99.4 kg)  Height: 5\' 4"  (1.626 m)   Physical Exam  Constitutional: She is well-developed, well-nourished, and in no distress.  Cardiovascular: Normal rate, regular rhythm and normal heart sounds. Exam reveals no gallop and no friction rub.  No murmur heard. Pulmonary/Chest: Effort normal and breath sounds normal. No respiratory distress. She has no wheezes. She has no rales.     Assessment & Plan:   See encounters tab for problem based medical decision making.   Patient discussed with Dr. Evette Doffing

## 2018-12-07 NOTE — Assessment & Plan Note (Signed)
History of present illness: Patient states that her dizziness has improved since last visit earlier this month. She states she tried hydrochlorothiazide for one week with little benefit. She states she has not been able to obtain the orange card due to current limited office avaliabilty from Covid-19.    Assessment: Vertigo Patient has a history of Mnire's disease and hydrochlorothiazide was recommended on last visit to see if this would help with vertigo symptoms.  Patient states she had little benefit after taking it for one week and does not want to restart the medication. She would like to pursue an brain MRI once she gets her orange card but has declined doing it at this time.  Plan -brain MRI once she obtains insurance

## 2018-12-08 ENCOUNTER — Telehealth: Payer: Self-pay | Admitting: *Deleted

## 2018-12-08 ENCOUNTER — Other Ambulatory Visit: Payer: Self-pay | Admitting: Internal Medicine

## 2018-12-08 LAB — IRON: Iron: 15 ug/dL — ABNORMAL LOW (ref 27–159)

## 2018-12-08 LAB — FERRITIN: Ferritin: 15 ng/mL (ref 15–150)

## 2018-12-08 MED ORDER — FERROUS SULFATE 325 (65 FE) MG PO TABS: 325.0000 mg | ORAL_TABLET | Freq: Every day | ORAL | 1 refills | Status: AC

## 2018-12-08 NOTE — Telephone Encounter (Signed)
-----   Message from [facility], [place] sent at 12/08/2018 12:33 PM EDT ----- Regarding: Iron supplements Can you please let [name] know that her iron is low and she needs to take iron supplements.  This was sent to her pharmacy.  Ferrous sulfate 325mg  once at night.  Thanks Dr. Heber [place]

## 2018-12-08 NOTE — Telephone Encounter (Signed)
Patient notified. Print option selected on ferrous sulfate. This was called to [place] at [place]. Hubbard Hartshorn, RN, BSN

## 2018-12-08 NOTE — Progress Notes (Signed)
Internal Medicine Clinic Attending  Case discussed with Dr. Hoffman at the time of the visit.  We reviewed the resident's history and exam and pertinent patient test results.  I agree with the assessment, diagnosis, and plan of care documented in the resident's note.  

## 2018-12-09 ENCOUNTER — Other Ambulatory Visit: Payer: Self-pay

## 2018-12-09 NOTE — Telephone Encounter (Signed)
Thanks

## 2018-12-09 NOTE — Telephone Encounter (Signed)
Ferrous sulfate was called to [facility] per chart. Called pt - no answer; left message to call the office.  Called [facility] - talked to [place], stated did not received rx yesterday . VO given for Ferrous sulfate 325 mg 1 tab at bedtime #90 x 1 RF. Stated sorry for the mishap.

## 2018-12-09 NOTE — Telephone Encounter (Signed)
Pt states meds is not at the pharmacy. Please call pt back.

## 2018-12-22 ENCOUNTER — Ambulatory Visit: Payer: Self-pay

## 2018-12-22 ENCOUNTER — Other Ambulatory Visit: Payer: Self-pay

## 2019-01-11 ENCOUNTER — Other Ambulatory Visit: Payer: Self-pay

## 2019-01-11 NOTE — Progress Notes (Signed)
Virtual Visit via Video Note   This visit type was conducted due to national recommendations for restrictions regarding the COVID-19 Pandemic (e.g. social distancing) in an effort to limit this patient's exposure and mitigate transmission in our community.  Due to her co-morbid illnesses, this patient is at least at moderate risk for complications without adequate follow up.  This format is felt to be most appropriate for this patient at this time.  All issues noted in this document were discussed and addressed.  A limited physical exam was performed with this format.  Please refer to the patient's chart for her consent to telehealth for [facility].   Date:  01/12/2019   ID:  Alyssa Hall, DOB 05-25-1986, MRN [id number]  Patient Location: Home Provider Location: Office  PCP:  Valinda Party, DO  Cardiologist:  Skeet Latch, MD  Electrophysiologist:  None   Evaluation Performed:  Follow-Up Visit  Chief Complaint:  palpitations  History of Present Illness:    Alyssa Hall is a 33 y.o. female with HTN, atrial tachycardia, and hypothyroidism who presents for follow up.  She was first evaluated 04/2015 for palpitations. She had a Holter that revealed sinus arrhythmia and one PAC but no other abnormalities. Prior to that appointment she was on atenolol and this was switched to diltiazem without improvement in her symptoms.  The episodes were associated with shortness of breath, lightheadedness, and dizziness. They frequently we occurred in the setting of stressful situations.  She was subsequently started on Zoloft in October 2016 but did not start taking until November. She also received lorazepam and was referred for counselling.  She was seen in the emergency department after having a panic attack while driving her car. At the time she felt dizzy and had numbness and tingling in her hand. She left without being seen but then returned on 09/05/59 with palpitations. Her EKG at  that time revealed atrial tachycardia with a rate of 106 bpm.  She followed up with her primary care physician, at which time atenolol 50 mg daily was started.  Her TSH was within normal limits.  Ms. Vanwagner saw Dr. Curt Bears on 10/2015 and he felt that she would not be a good candidate for ablation of her atrial tachycardia because the source was in the low atrial septum and posterior.  Atenolol was discontinued due to hypotension and dizziness. This was then switched to metoprolol when she developed poorly controlled hypertension.  However she felt poorly on this medication and was subsequently switched to labetalol.  She did very well on ivabradine but this was stopped because she is planning to have a family.  Since her last appointment Ms. Philipp has been feeling well.  Her main complaint is that she has been feeling lightheaded for the last two months.  She notes it when she stands quickly but also when lying in bed or when turning her head to the side.  She reports sinus congestion and drainage.  She has a history of  Mnire's disease and her PCP started HCTZ 11/2018.  She is getting an MRI next week.  She has otherwise been feeling great.  She is not eating meat or carbs and walks evrey night after dinner. She lost 30 lb and has no palpitations.  She and her husband will be moving to [place] in two months.   The patient does not have symptoms concerning for COVID-19 infection (fever, chills, cough, or new shortness of breath).    Past Medical History:  Diagnosis  Date  . Anemia   . Anxiety   . Atrial tachycardia ([place])   . Depression   . Dizziness   . Fibroids   . Hypertension   . Hypothyroid   . Meniere's disease   . Thyrotoxicosis    No past surgical history on file.   Current Meds  Medication Sig  . acetaminophen (TYLENOL) 325 MG tablet Take 650 mg by mouth every 6 (six) hours as needed for mild pain.   . ferrous sulfate 325 (65 FE) MG tablet Take 1 tablet (325 mg total) by mouth at  bedtime.  Marland Kitchen labetalol (NORMODYNE) 100 MG tablet Take 1 tablet (100 mg total) by mouth 2 (two) times daily.  Marland Kitchen levothyroxine (SYNTHROID) 75 MCG tablet Take 1 tablet (75 mcg total) by mouth daily before breakfast.  . prenatal vitamin w/FE, FA (NATACHEW) 29-1 MG CHEW chewable tablet Chew 1 tablet by mouth daily at 12 noon.     Allergies:   Patient has no known allergies.   Social History   Tobacco Use  . Smoking status: Never Smoker  . Smokeless tobacco: Never Used  Substance Use Topics  . Alcohol use: No    Alcohol/week: 0.0 standard drinks  . Drug use: No     Family Hx: The patient's family history includes Heart failure in her maternal aunt.  ROS:   Please see the history of present illness.     All other systems reviewed and are negative.   Prior CV studies:   The following studies were reviewed today:  24 Hour Holter Monitor 04/27/15:  Quality: Fair. Baseline artifact. Predominant rhythm: sinus rhythms Other rhythms: multiple episodes of sinus arrhythmia, one episode of ectopic atrial rhythm at 75 bpm,  Average heart rate: 78 bpm Max heart rate: 125 bpm Min heart rate: 65 bpm Pauses >2.5 seconds: 0 Ventricular ectopics: 0 Patient did not submit a symptom diary  Labs/Other Tests and Data Reviewed:    EKG:  An ECG dated 05/21/18 was personally reviewed today and demonstrated:  ectopic atrial rhythm.  Rate 93 bpm.  Recent Labs: 09/22/2018: TSH 1.990 10/25/2018: ALT 18; BUN 9; Creatinine, Ser 0.78; Hemoglobin 11.1; Platelets 390; Potassium 3.8; Sodium 138   Recent Lipid Panel Lab Results  Component Value Date/Time   CHOL 156 09/11/2015 04:25 PM   TRIG 57 09/11/2015 04:25 PM   HDL 55 09/11/2015 04:25 PM   CHOLHDL 2.8 09/11/2015 04:25 PM   LDLCALC 90 09/11/2015 04:25 PM    Wt Readings from Last 3 Encounters:  12/07/18 219 lb 3.2 oz (99.4 kg)  11/09/18 226 lb 11.2 oz (102.8 kg)  10/25/18 233 lb (105.7 kg)     Objective:    There were no vitals taken for  this visit. GENERAL: Well-appearing.  No acute distress. HEENT: Pupils equal round.  Oral mucosa unremarkable NECK:  No jugular venous distention, no visible thyromegaly EXT:  No edema, no cyanosis no clubbing SKIN:  No rashes no nodules NEURO:  Speech fluent.  Cranial nerves grossly intact.  Moves all 4 extremities freely PSYCH:  Cognitively intact, oriented to person place and time   ASSESSMENT & PLAN:    # Hypertension: # Dizziness:  BP well-controlled at home.  Her dizziness seems more consistent with vertigo.  She has a history of Mnire's disease and is working with her PCP on this.  Continue HCTZ and labetalol. Continue treatment of iron deficiency anemia.   # Ectopic atrial rhythm: # Palpitations: Symptoms are well-controlled.  Continue labetalol.   #  Hypothyroidism: Thyroid medication has been adjusted and her TSH is within normal limits.   COVID-19 Education: The signs and symptoms of COVID-19 were discussed with the patient and how to seek care for testing (follow up with PCP or arrange E-visit).  The importance of social distancing was discussed today.  Time:   Today, I have spent 17 minutes with the patient with telehealth technology discussing the above problems.     Medication Adjustments/Labs and Tests Ordered: Current medicines are reviewed at length with the patient today.  Concerns regarding medicines are outlined above.   Tests Ordered: No orders of the defined types were placed in this encounter.   Medication Changes: Meds ordered this encounter  Medications  . fluticasone (FLONASE) 50 MCG/ACT nasal spray    Sig: Place 2 sprays into both nostrils daily.    Dispense:  15.8 g    Refill:  1  . cetirizine (ZYRTEC) 10 MG tablet    Sig: Take 1 tablet (10 mg total) by mouth daily as needed for allergies.    Dispense:  30 tablet    Refill:  1    Disposition:  Follow up prn  Signed, Skeet Latch, MD  01/12/2019 10:29 AM    [place]

## 2019-01-12 ENCOUNTER — Telehealth (INDEPENDENT_AMBULATORY_CARE_PROVIDER_SITE_OTHER): Payer: Self-pay | Admitting: Cardiovascular Disease

## 2019-01-12 ENCOUNTER — Encounter: Payer: Self-pay | Admitting: *Deleted

## 2019-01-12 ENCOUNTER — Telehealth: Payer: Self-pay | Admitting: Cardiovascular Disease

## 2019-01-12 DIAGNOSIS — R0789 Other chest pain: Secondary | ICD-10-CM

## 2019-01-12 DIAGNOSIS — D509 Iron deficiency anemia, unspecified: Secondary | ICD-10-CM

## 2019-01-12 DIAGNOSIS — I471 Supraventricular tachycardia: Secondary | ICD-10-CM

## 2019-01-12 DIAGNOSIS — I10 Essential (primary) hypertension: Secondary | ICD-10-CM

## 2019-01-12 MED ORDER — FLUTICASONE PROPIONATE 50 MCG/ACT NA SUSP: 2.0000 | Freq: Every day | NASAL | 1 refills | Status: AC

## 2019-01-12 MED ORDER — CETIRIZINE HCL 10 MG PO TABS: 10.0000 mg | ORAL_TABLET | Freq: Every day | ORAL | 1 refills | Status: AC | PRN

## 2019-01-12 NOTE — Patient Instructions (Addendum)
Medication Instructions:  TRY FLONASE NASAL SPRAY AND ZYRTEC 10 MG DAILY AS NEEDED  GENERIC FOR BOTH OK  If you need a refill on your cardiac medications before your next appointment, please call your pharmacy.   Lab work: NONE  Testing/Procedures: NONE  Follow-Up: AS NEEDED  Any Other Special Instructions Will Be Listed Below (If Applicable)  Dr. Hessie Knows- Cardiologist in [place]

## 2019-01-12 NOTE — Telephone Encounter (Signed)
Tried to call patient several times, sent my chart message to call if any questions regarding today's visit with Dr Oval Linsey

## 2019-01-12 NOTE — Telephone Encounter (Signed)
° ° °  Please return call to patient °

## 2019-01-13 ENCOUNTER — Ambulatory Visit (INDEPENDENT_AMBULATORY_CARE_PROVIDER_SITE_OTHER): Payer: Self-pay | Admitting: Internal Medicine

## 2019-01-13 ENCOUNTER — Other Ambulatory Visit: Payer: Self-pay

## 2019-01-13 DIAGNOSIS — D509 Iron deficiency anemia, unspecified: Secondary | ICD-10-CM

## 2019-01-13 DIAGNOSIS — I10 Essential (primary) hypertension: Secondary | ICD-10-CM

## 2019-01-13 DIAGNOSIS — R42 Dizziness and giddiness: Secondary | ICD-10-CM

## 2019-01-13 DIAGNOSIS — E039 Hypothyroidism, unspecified: Secondary | ICD-10-CM

## 2019-01-15 MED ORDER — LABETALOL HCL 100 MG PO TABS: 100.0000 mg | ORAL_TABLET | Freq: Two times a day (BID) | ORAL | 0 refills | Status: AC

## 2019-01-15 MED ORDER — LEVOTHYROXINE SODIUM 75 MCG PO TABS: 75.0000 ug | ORAL_TABLET | Freq: Every day | ORAL | 1 refills | Status: DC

## 2019-01-15 NOTE — Assessment & Plan Note (Signed)
HPI:  Patient reports adherence to labetalol daily.  Well controlled at home.  Denies side effects to the medication.  Assessment:  Essential hypertension Unable to do vitals due to tele-health visit.   Plan -continue labetalol

## 2019-01-15 NOTE — Assessment & Plan Note (Signed)
HPI:  Patient has been evaluated for vertigo several times and now is able to obtain an MRI due to having insurance.  Symptoms have been stable since last seen 5 weeks ago.  She is not taking HCTZ.  Assessment:  Vertigo Patient has MRI scheduled on 02/08/19 at 2pm.  This was relayed to the patient  Plan -follow up MRI brain

## 2019-01-15 NOTE — Assessment & Plan Note (Signed)
HPI:  Currently taking iron supplementation daily  Assessment:  Iron deficiency anemia secondary to menorrhagia Ferritin and Iron low at last visit  Plan -continue ferrous sulfate

## 2019-01-15 NOTE — Assessment & Plan Note (Signed)
HPI:  Patient reports adherence to levothyroxine 70mcg daily.  Clinically euthyroid.    Assessment:  Hypothyroidism Last tsh this year was normal.  No indication to repeat lab at this time.  Plan -continue levothyroxine at 57mcg daily

## 2019-01-15 NOTE — Progress Notes (Signed)
  [facility] Internal Medicine Residency Telephone Encounter Continuity Care Appointment  HPI:   This telephone encounter was created for [name] on 01/13/2019 for the following purpose/cc follow up on hypertension.   Past Medical History:  Past Medical History:  Diagnosis Date  . Anemia   . Anxiety   . Atrial tachycardia ([place])   . Depression   . Dizziness   . Fibroids   . Hypertension   . Hypothyroid   . Meniere's disease   . Thyrotoxicosis       ROS:  Review of Systems  Constitutional: Negative for chills and fever.  Respiratory: Negative for shortness of breath.   Cardiovascular: Negative for chest pain and palpitations.  Neurological: Positive for dizziness. Negative for tingling and headaches.       Assessment / Plan / Recommendations:   Please see A&P under problem oriented charting for assessment of the patient's acute and chronic medical conditions.   As always, pt is advised that if symptoms worsen or new symptoms arise, they should go to an urgent care facility or to to ER for further evaluation.   Consent and Medical Decision Making:   Patient discussed with Dr. Daryll Drown  This is a telephone encounter between Alyssa Hall and Boyd Kerbs on 01/13/2019 for hypertension. The visit was conducted with the patient located at home and Boyd Kerbs at [facility]. The patient's identity was confirmed using their DOB. The patient has consented to being evaluated through a telephone encounter and understands the associated risks (an examination cannot be done and the patient may need to come in for an appointment) / benefits (allows the patient to remain at home, decreasing exposure to coronavirus). I personally spent 14 minutes on medical discussion.

## 2019-01-19 ENCOUNTER — Ambulatory Visit (INDEPENDENT_AMBULATORY_CARE_PROVIDER_SITE_OTHER): Payer: Self-pay | Admitting: *Deleted

## 2019-01-19 ENCOUNTER — Other Ambulatory Visit: Payer: Self-pay

## 2019-01-19 DIAGNOSIS — Z111 Encounter for screening for respiratory tuberculosis: Secondary | ICD-10-CM

## 2019-01-19 DIAGNOSIS — Z23 Encounter for immunization: Secondary | ICD-10-CM

## 2019-01-20 ENCOUNTER — Telehealth: Payer: Self-pay | Admitting: Cardiovascular Disease

## 2019-01-21 ENCOUNTER — Encounter: Payer: Self-pay | Admitting: *Deleted

## 2019-01-21 LAB — TB SKIN TEST
Induration: 0 mm
TB Skin Test: NEGATIVE

## 2019-01-24 NOTE — Progress Notes (Signed)
Internal Medicine Clinic Attending  Case discussed with Dr. Heber Arpin soon after the resident saw the patient.  We reviewed the resident's history, telephone conversation and pertinent patient test results.  I agree with the assessment, diagnosis, and plan of care documented in the resident's note.

## 2019-02-06 ENCOUNTER — Encounter (HOSPITAL_COMMUNITY): Payer: Self-pay | Admitting: Emergency Medicine

## 2019-02-06 ENCOUNTER — Emergency Department (HOSPITAL_COMMUNITY)
Admission: EM | Admit: 2019-02-06 | Discharge: 2019-02-06 | Disposition: A | Discharge status: Home / Self Care | Payer: Self-pay | Attending: Emergency Medicine | Admitting: Emergency Medicine

## 2019-02-06 ENCOUNTER — Other Ambulatory Visit: Payer: Self-pay

## 2019-02-06 DIAGNOSIS — R42 Dizziness and giddiness: Secondary | ICD-10-CM | POA: Insufficient documentation

## 2019-02-06 DIAGNOSIS — E039 Hypothyroidism, unspecified: Secondary | ICD-10-CM | POA: Insufficient documentation

## 2019-02-06 DIAGNOSIS — Z79899 Other long term (current) drug therapy: Secondary | ICD-10-CM | POA: Insufficient documentation

## 2019-02-06 DIAGNOSIS — I10 Essential (primary) hypertension: Secondary | ICD-10-CM | POA: Insufficient documentation

## 2019-02-06 DIAGNOSIS — R002 Palpitations: Secondary | ICD-10-CM | POA: Insufficient documentation

## 2019-02-06 LAB — CBC WITH DIFFERENTIAL/PLATELET
Abs Immature Granulocytes: 0.01 10*3/uL (ref 0.00–0.07)
Basophils Absolute: 0 10*3/uL (ref 0.0–0.1)
Basophils Relative: 0 %
Eosinophils Absolute: 0.1 10*3/uL (ref 0.0–0.5)
Eosinophils Relative: 2 %
HCT: 37.7 % (ref 36.0–46.0)
Hemoglobin: 11.1 g/dL — ABNORMAL LOW (ref 12.0–15.0)
Immature Granulocytes: 0 %
Lymphocytes Relative: 33 %
Lymphs Abs: 1.6 10*3/uL (ref 0.7–4.0)
MCH: 22.9 pg — ABNORMAL LOW (ref 26.0–34.0)
MCHC: 29.4 g/dL — ABNORMAL LOW (ref 30.0–36.0)
MCV: 77.9 fL — ABNORMAL LOW (ref 80.0–100.0)
Monocytes Absolute: 0.4 10*3/uL (ref 0.1–1.0)
Monocytes Relative: 9 %
Neutro Abs: 2.7 10*3/uL (ref 1.7–7.7)
Neutrophils Relative %: 56 %
Platelets: 357 10*3/uL (ref 150–400)
RBC: 4.84 MIL/uL (ref 3.87–5.11)
RDW: 18.8 % — ABNORMAL HIGH (ref 11.5–15.5)
WBC: 4.8 10*3/uL (ref 4.0–10.5)
nRBC: 0 % (ref 0.0–0.2)

## 2019-02-06 LAB — COMPREHENSIVE METABOLIC PANEL
ALT: 15 U/L (ref 0–44)
AST: 23 U/L (ref 15–41)
Albumin: 4.1 g/dL (ref 3.5–5.0)
Alkaline Phosphatase: 49 U/L (ref 38–126)
Anion gap: 10 (ref 5–15)
BUN: 11 mg/dL (ref 6–20)
CO2: 23 mmol/L (ref 22–32)
Calcium: 10.7 mg/dL — ABNORMAL HIGH (ref 8.9–10.3)
Chloride: 104 mmol/L (ref 98–111)
Creatinine, Ser: 0.7 mg/dL (ref 0.44–1.00)
GFR calc Af Amer: >60 mL/min (ref >60)
GFR calc non Af Amer: >60 mL/min (ref >60)
Glucose, Bld: 85 mg/dL (ref 70–99)
Potassium: 4.5 mmol/L (ref 3.5–5.1)
Sodium: 137 mmol/L (ref 135–145)
Total Bilirubin: 1 mg/dL (ref 0.3–1.2)
Total Protein: 7.5 g/dL (ref 6.5–8.1)

## 2019-02-06 LAB — TROPONIN I: Troponin I: <0.03 ng/mL (ref <0.03)

## 2019-02-06 MED ORDER — SODIUM CHLORIDE 0.9 % IV BOLUS
1000.0000 mL | Freq: Once | INTRAVENOUS | Status: AC
  Administered 2019-02-06: 1000 mL via INTRAVENOUS

## 2019-02-06 NOTE — ED Notes (Signed)
Patient verbalizes understanding of discharge instructions. Opportunity for questioning and answers were provided. Armband removed by staff, pt discharged from ED.  

## 2019-02-06 NOTE — ED Triage Notes (Signed)
Pt transported via [facility]  W/co of dizziness and lightheaded for two months. Pt co of headache constant for two months. Pt states that she was going to be seen by her dr On Tuesday.

## 2019-02-06 NOTE — Discharge Instructions (Addendum)
Return here as needed.  Your testing here today did not show any significant abnormality.  You need to speak with your doctor and have a follow-up appointment as soon as possible.

## 2019-02-06 NOTE — ED Notes (Signed)
Will obtain orthostatic vitals after partial amount of fluids are received by pt. RN recommended.

## 2019-02-08 ENCOUNTER — Other Ambulatory Visit: Payer: Self-pay

## 2019-02-08 ENCOUNTER — Encounter: Payer: Self-pay | Admitting: Internal Medicine

## 2019-02-08 ENCOUNTER — Encounter (HOSPITAL_COMMUNITY): Payer: Self-pay

## 2019-02-08 ENCOUNTER — Ambulatory Visit (HOSPITAL_COMMUNITY)
Admission: RE | Admit: 2019-02-08 | Discharge: 2019-02-08 | Disposition: A | Discharge status: Home / Self Care | Payer: Self-pay | Source: Ambulatory Visit | Attending: Internal Medicine | Admitting: Internal Medicine

## 2019-02-08 ENCOUNTER — Ambulatory Visit (HOSPITAL_COMMUNITY): Admission: RE | Admit: 2019-02-08 | Payer: Self-pay | Source: Ambulatory Visit

## 2019-02-08 DIAGNOSIS — H814 Vertigo of central origin: Secondary | ICD-10-CM

## 2019-02-09 ENCOUNTER — Encounter: Payer: Self-pay | Admitting: Internal Medicine

## 2019-02-09 NOTE — Telephone Encounter (Signed)
Pt unable to proceed with MRI sch on yesterday 02/08/2019.  Pt called to request an Open MRI if possible rather than using a medication to get this procedure done.  Please advise.

## 2019-02-09 NOTE — Telephone Encounter (Signed)
Spoke with Antoine @ [place].  Their office will reach out to the patient and screen her for [place]  and get her scheduled for an Open MRI.

## 2019-02-13 NOTE — ED Provider Notes (Signed)
[facility] EMERGENCY DEPARTMENT Provider Note   CSN: [id number] Arrival date & time: 02/06/19  5053    History   Chief Complaint Chief Complaint  Patient presents with  . Dizziness  . Palpitations    HPI Alyssa Hall is a 33 y.o. female.     HPI Patient presents to the emergency department with mitten episodes of dizziness and lightheadedness over the last 3 to 4 months.  The patient states she does have headaches almost daily for the last 2 months.  She states that she is got an appointment to be seen by her doctor on Tuesday.  The patient states that she also feels this heaviness sometimes when she tries to walk.  States she feels like her body is just going to give out.  Patient states nothing seems to make the condition better.  The patient denies chest pain, shortness of breath, ,blurred vision, neck pain, fever, cough, weakness, numbness anorexia, edema, abdominal pain, nausea, vomiting, diarrhea, rash, back pain, dysuria, hematemesis, bloody stool, near syncope, or syncope. Past Medical History:  Diagnosis Date  . Anemia   . Anxiety   . Atrial tachycardia ([place])   . Depression   . Dizziness   . Fibroids   . Hypertension   . Hypothyroid   . Meniere's disease   . Thyrotoxicosis     Patient Active Problem List   Diagnosis Date Noted  . Vertigo 09/23/2018  . Morbid obesity ([place]) 03/29/2018  . Migraine with aura and without status migrainosus, not intractable 05/17/2017  . Atrial ectopic tachycardia ([place]) 12/06/2014  . Essential hypertension 10/11/2013  . Healthcare maintenance 05/27/2013  . Meniere's disease 07/12/2012  . Dysmenorrhea 05/05/2012  . Iron deficiency anemia 02/04/2012  . Hypothyroidism (acquired) 01/14/2012  . Generalized anxiety disorder 01/14/2012    History reviewed. No pertinent surgical history.   OB History    Gravida  1   Para      Term      Preterm      AB  1   Living        SAB      TAB  1   Ectopic       Multiple      Live Births               Home Medications    Prior to Admission medications   Medication Sig Start Date End Date Taking? Authorizing Provider  acetaminophen (TYLENOL) 325 MG tablet Take 650 mg by mouth every 6 (six) hours as needed for mild pain.    Yes [provider]  ferrous sulfate 325 (65 FE) MG tablet Take 1 tablet (325 mg total) by mouth at bedtime. 12/08/18  Yes Hoffman, Jessica Ratliff, DO  fluticasone (FLONASE) 50 MCG/ACT nasal spray Place 2 sprays into both nostrils daily. Patient taking differently: Place 2 sprays into both nostrils daily as needed for allergies.  01/12/19  Yes Skeet Latch, MD  labetalol (NORMODYNE) 100 MG tablet Take 1 tablet (100 mg total) by mouth 2 (two) times daily. 01/15/19  Yes Valinda Party, DO  levothyroxine (SYNTHROID) 75 MCG tablet Take 1 tablet (75 mcg total) by mouth daily before breakfast. 01/15/19 07/14/19 Yes Hoffman, Elza Rafter, DO  cetirizine (ZYRTEC) 10 MG tablet Take 1 tablet (10 mg total) by mouth daily as needed for allergies. Patient not taking: Reported on 02/06/2019 01/12/19   Skeet Latch, MD  prenatal vitamin w/FE, FA (NATACHEW) 29-1 MG CHEW chewable tablet Chew 1 tablet  by mouth daily at 12 noon. Patient not taking: Reported on 02/06/2019 03/30/18   Katherine Roan, MD    Family History Family History  Problem Relation Age of Onset  . Heart failure Maternal Aunt     Social History Social History   Tobacco Use  . Smoking status: Never Smoker  . Smokeless tobacco: Never Used  Substance Use Topics  . Alcohol use: No    Alcohol/week: 0.0 standard drinks  . Drug use: No     Allergies   Patient has no known allergies.   Review of Systems Review of Systems All other systems negative except as documented in the HPI. All pertinent positives and negatives as reviewed in the HPI.  Physical Exam Updated Vital Signs BP 131/71   Pulse 92   Temp 97.9 F (36.6 C)   Resp 18    SpO2 100%   Physical Exam Vitals signs and nursing note reviewed.  Constitutional:      General: She is not in acute distress.    Appearance: She is well-developed.  HENT:     Head: Normocephalic and atraumatic.  Eyes:     Pupils: Pupils are equal, round, and reactive to light.  Neck:     Musculoskeletal: Normal range of motion and neck supple.  Cardiovascular:     Rate and Rhythm: Normal rate and regular rhythm.     Heart sounds: Normal heart sounds. No murmur. No friction rub. No gallop.   Pulmonary:     Effort: Pulmonary effort is normal. No respiratory distress.     Breath sounds: Normal breath sounds. No wheezing.  Abdominal:     General: Bowel sounds are normal. There is no distension.     Palpations: Abdomen is soft.     Tenderness: There is no abdominal tenderness.  Skin:    General: Skin is warm and dry.     Capillary Refill: Capillary refill takes less than 2 seconds.     Findings: No erythema or rash.  Neurological:     Mental Status: She is alert and oriented to person, place, and time.     Motor: No abnormal muscle tone.     Coordination: Coordination normal.  Psychiatric:        Behavior: Behavior normal.      ED Treatments / Results  Labs (all labs ordered are listed, but only abnormal results are displayed) Labs Reviewed  COMPREHENSIVE METABOLIC PANEL - Abnormal; Notable for the following components:      Result Value   Calcium 10.7 (*)    All other components within normal limits  CBC WITH DIFFERENTIAL/PLATELET - Abnormal; Notable for the following components:   Hemoglobin 11.1 (*)    MCV 77.9 (*)    MCH 22.9 (*)    MCHC 29.4 (*)    RDW 18.8 (*)    All other components within normal limits  TROPONIN I  CBC WITH DIFFERENTIAL/PLATELET    EKG EKG Interpretation  Date/Time:  Sunday Feb 06 2019 09:43:28 EDT Ventricular Rate:  82 PR Interval:    QRS Duration: 98 QT Interval:  374 QTC Calculation: 437 R Axis:   31 Text Interpretation:   Sinus rhythm No acute changes normal intervals No significant change since last tracing Confirmed by Varney Biles ([id number]) on 02/06/2019 12:02:45 PM   Radiology No results found.  Procedures Procedures (including critical care time)  Medications Ordered in ED Medications  sodium chloride 0.9 % bolus 1,000 mL (0 mLs Intravenous Stopped 02/06/19 1115)  Initial Impression / Assessment and Plan / ED Course  I have reviewed the triage vital signs and the nursing notes.  Pertinent labs & imaging results that were available during my care of the patient were reviewed by me and considered in my medical decision making (see chart for details).        Patient does not have any physical exam findings that would yield an answer to why she is having this chronic problem.  Her laboratory testing does not show any significant abnormalities at this time.  I did advise her that she will need to follow-up with her primary doctor for further evaluation and care of this issue.  At this time the patient is stable and will be discharged home.  I advised the patient to return here for any worsening her condition.  Patient voiced an understanding the plan and all questions were answered.  Final Clinical Impressions(s) / ED Diagnoses   Final diagnoses:  Palpitations  Dizziness    ED Discharge Orders    None       Dalia Heading, PA-C 02/13/19 Alma, Ankit, MD 02/14/19 2023

## 2019-03-03 ENCOUNTER — Other Ambulatory Visit: Payer: Self-pay

## 2019-03-03 ENCOUNTER — Ambulatory Visit (INDEPENDENT_AMBULATORY_CARE_PROVIDER_SITE_OTHER): Payer: Self-pay | Admitting: Internal Medicine

## 2019-03-03 DIAGNOSIS — R5383 Other fatigue: Secondary | ICD-10-CM

## 2019-03-03 DIAGNOSIS — Z79899 Other long term (current) drug therapy: Secondary | ICD-10-CM

## 2019-03-03 DIAGNOSIS — F419 Anxiety disorder, unspecified: Secondary | ICD-10-CM

## 2019-03-03 DIAGNOSIS — Z79890 Hormone replacement therapy (postmenopausal): Secondary | ICD-10-CM

## 2019-03-03 DIAGNOSIS — R110 Nausea: Secondary | ICD-10-CM

## 2019-03-03 NOTE — Assessment & Plan Note (Addendum)
HPI: Patient reports a one-week history of increased fatigue with associated symptoms of nausea and difficulty concentrating.  She reports increased anxiety in the last few weeks due to an upcoming move from [place] to [place] that will be happening next week.  She is also nervous about her MRI results that is scheduled in a couple days.   She is currently taking levothyroxine 75 mcg, labetalol 100 mg twice a day and iron 325 mg daily.  She reports taking her blood pressure today and it was 119/75. She denies fever/chills, vomiting, abdominal pain, cough, shortness of breath, increased urinary frequency.  Assessment: Acute fatigue Not clear what is causing the patient symptoms at this time.  Will obtain TSH and beta HCG.  Patient had recent CBC that was stable.  Told patient that if symptoms worsen to call back to be seen in clinic.  Plan -TSH -Beta HCG

## 2019-03-03 NOTE — Progress Notes (Signed)
  [facility] Internal Medicine Residency Telephone Encounter Continuity Care Appointment  HPI:   This telephone encounter was created for [name] on 03/03/2019 for the following purpose/cc fatigue.   Past Medical History:  Past Medical History:  Diagnosis Date  . Anemia   . Anxiety   . Atrial tachycardia ([place])   . Depression   . Dizziness   . Fibroids   . Hypertension   . Hypothyroid   . Meniere's disease   . Thyrotoxicosis       ROS:  Review of Systems  Constitutional: Positive for malaise/fatigue.  Respiratory: Negative for cough and shortness of breath.   Gastrointestinal: Positive for nausea. Negative for abdominal pain and vomiting.  Genitourinary: Negative for frequency and urgency.     Assessment / Plan / Recommendations:   Please see A&P under problem oriented charting for assessment of the patient's acute and chronic medical conditions.   As always, pt is advised that if symptoms worsen or new symptoms arise, they should go to an urgent care facility or to to ER for further evaluation.   Consent and Medical Decision Making:   Patient discussed with Dr. Daryll Drown  This is a telephone encounter between Alyssa Hall and Boyd Kerbs on 03/03/2019 for acute fatigue. The visit was conducted with the patient located at home and Boyd Kerbs at [facility]. The patient's identity was confirmed using their DOB and current address. The patient has consented to being evaluated through a telephone encounter and understands the associated risks (an examination cannot be done and the patient may need to come in for an appointment) / benefits (allows the patient to remain at home, decreasing exposure to coronavirus). I personally spent 16 minutes on medical discussion.

## 2019-03-05 ENCOUNTER — Other Ambulatory Visit: Payer: Self-pay

## 2019-03-05 ENCOUNTER — Ambulatory Visit
Admission: RE | Admit: 2019-03-05 | Discharge: 2019-03-05 | Disposition: A | Discharge status: Home / Self Care | Payer: Self-pay | Source: Ambulatory Visit | Attending: Internal Medicine | Admitting: Internal Medicine

## 2019-03-05 DIAGNOSIS — H814 Vertigo of central origin: Secondary | ICD-10-CM

## 2019-03-05 MED ORDER — GADOBENATE DIMEGLUMINE 529 MG/ML IV SOLN
20.0000 mL | Freq: Once | INTRAVENOUS | Status: AC | PRN
  Administered 2019-03-05: 20 mL via INTRAVENOUS

## 2019-03-07 ENCOUNTER — Encounter: Payer: Self-pay | Admitting: *Deleted

## 2019-03-07 ENCOUNTER — Telehealth: Payer: Self-pay

## 2019-03-07 NOTE — Telephone Encounter (Signed)
Requesting MRI results. Please call pt back.

## 2019-03-07 NOTE — Telephone Encounter (Signed)
Called an updated patient about MRI results>>> likely labrynthitits discussed should continue to get better, she will let our office know if not improved in the next few weeks.

## 2019-03-08 ENCOUNTER — Other Ambulatory Visit: Payer: Self-pay

## 2019-03-09 LAB — BETA HCG QUANT (REF LAB): hCG Quant: <1 m[IU]/mL

## 2019-03-09 LAB — TSH: TSH: 3.41 u[IU]/mL (ref 0.450–4.500)

## 2019-03-13 NOTE — Progress Notes (Signed)
Internal Medicine Clinic Attending  Case discussed with Dr. Heber [place] at the time of the visit.  We reviewed the resident's history, telephone conversation and pertinent patient test results.  I agree with the assessment, diagnosis, and plan of care documented in the resident's note.

## 2019-03-14 ENCOUNTER — Encounter: Payer: Self-pay | Admitting: *Deleted

## 2019-03-14 DIAGNOSIS — H8121 Vestibular neuronitis, right ear: Secondary | ICD-10-CM

## 2019-03-14 NOTE — Assessment & Plan Note (Signed)
MRI showed no CVA.  Abnormal enhancement of right vestibular nerve consistent with vestibular neuritis.  Will place referral for vestibular rehab due to persistent vertigo symptoms.  Given length of time since onset would not complete steroid course at this time.

## 2019-03-14 NOTE — Telephone Encounter (Signed)
See A/P note

## 2019-03-25 ENCOUNTER — Encounter: Payer: Self-pay | Admitting: Internal Medicine

## 2019-03-27 IMAGING — DX DG CHEST 2V
2 series · 2 of 2 positions shown · non-contrast
Comparison: 02/08/2017

CLINICAL DATA: Left side chest pain

EXAM:
CHEST  2 VIEW

[w chest pa]
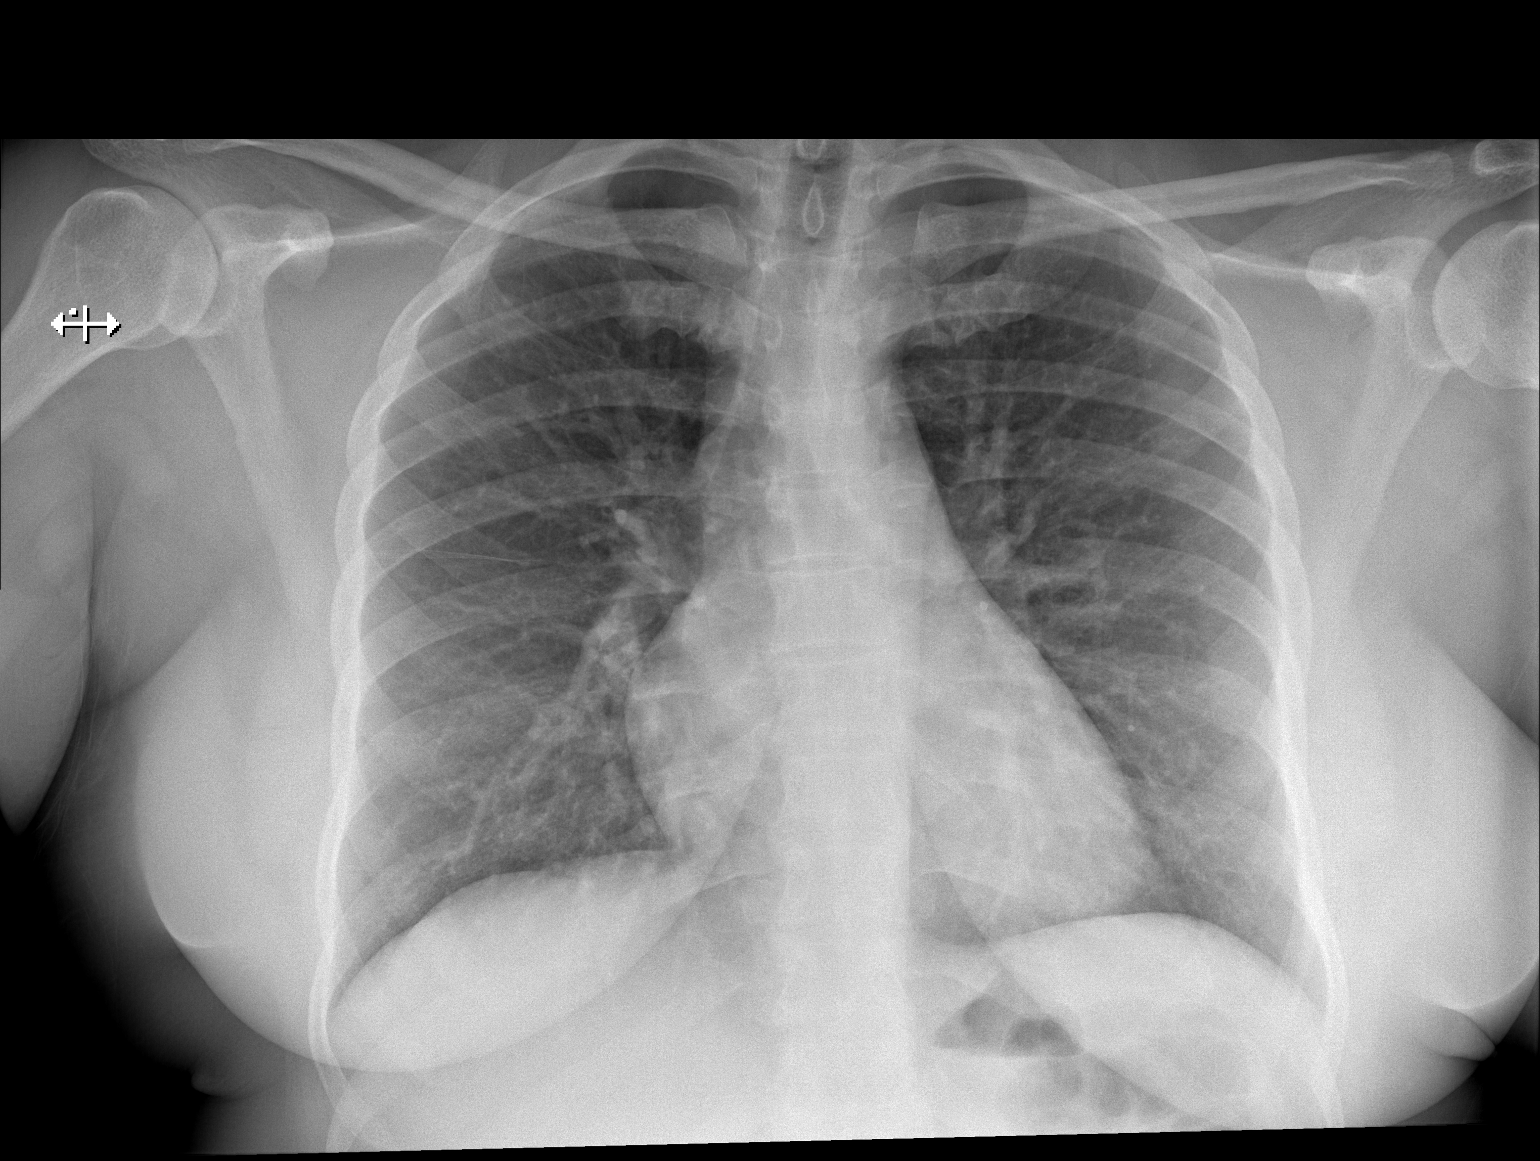

[w chest lat]
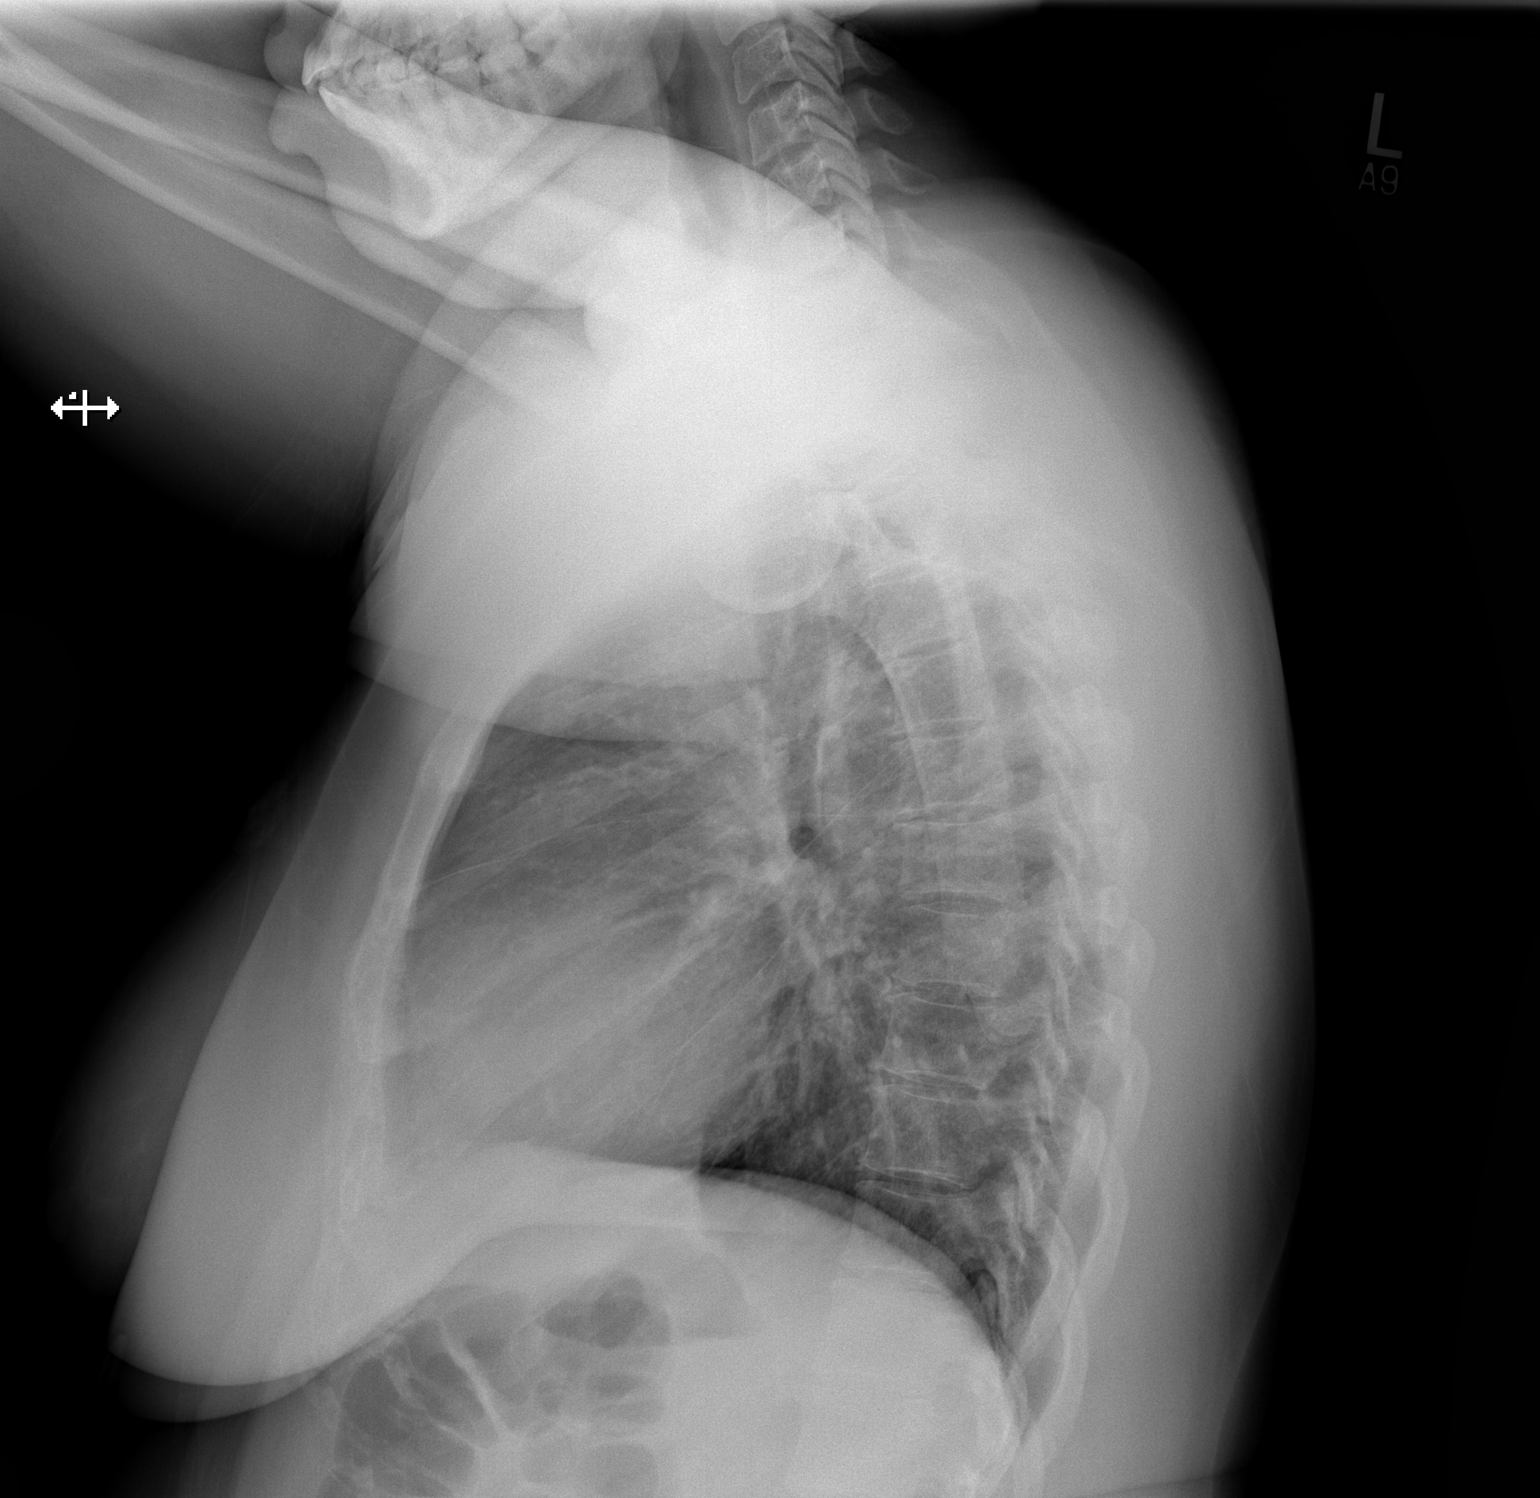

[2 of 2 positions shown; findings below may reference images not displayed]

FINDINGS: Heart and mediastinal contours are within normal limits. No focal
opacities or effusions. No acute bony abnormality.
IMPRESSION: No active cardiopulmonary disease.

## 2019-03-31 NOTE — Telephone Encounter (Signed)
Called pt and spoke to her about referral to vestibular rehab, she states she has spoken to [facility] in [place] and they have scheduled an appt for her. She states she does not need a referral. She does request records to be sent, informed pt she will need to sign a release of information form in order for [facility] to do so, she will either go the new office and do this or come here, she is not sure.  Also she wants to know should she take the ferrous sulfate 325 1x daily and a prenatal vitamin 1x daily, please advise

## 2019-04-04 NOTE — Telephone Encounter (Signed)
To all,   I hope this message finds you well. I have yet to meet Alyssa Hall in person and would like to establish care first before suggesting we stop any medications. After reviewing her chart, it seems that she does not have an adequate supply of iron, but it's been several months since her latest labs. I hope you have a pleasant evening and thank you for your help! Sincerely, Maudie Mercury

## 2019-04-05 NOTE — Telephone Encounter (Signed)
Agree with Dr Gilford Rile reasonable to continue taking ferrous sulfate and prenatal vitamin,  If she desires she could decrease ferrous sulfate to every other day.

## 2019-05-05 ENCOUNTER — Telehealth: Payer: Self-pay | Admitting: Cardiovascular Disease

## 2019-05-05 NOTE — Telephone Encounter (Signed)
Patient moved to [place], [place] she would like for Dr. Oval Linsey to give her recommendation to a cardiologist in the area.

## 2019-05-05 NOTE — Telephone Encounter (Signed)
PT NOTIFIED SHE HAS ALREADY MOVED SHE WILL cb IF ANYTHING ELSE IS NEEDED

## 2019-05-05 NOTE — Telephone Encounter (Signed)
Dr. Hessie Knows

## 2019-10-06 ENCOUNTER — Other Ambulatory Visit: Payer: Self-pay | Admitting: *Deleted

## 2019-10-07 MED ORDER — LEVOTHYROXINE SODIUM 75 MCG PO TABS: 75.0000 ug | ORAL_TABLET | Freq: Every day | ORAL | 1 refills | Status: AC

## 2021-03-12 ENCOUNTER — Encounter: Payer: Self-pay | Admitting: *Deleted
